# Patient Record
Sex: Female | Born: 1935
Health system: Southern US, Community
[De-identification: ages and names within clinical notes are randomized; demographics above are authoritative.]

## PROBLEM LIST (undated history)

## (undated) DIAGNOSIS — I10 Essential (primary) hypertension: Secondary | ICD-10-CM

## (undated) DIAGNOSIS — E079 Disorder of thyroid, unspecified: Secondary | ICD-10-CM

## (undated) DIAGNOSIS — F329 Major depressive disorder, single episode, unspecified: Secondary | ICD-10-CM

## (undated) DIAGNOSIS — D72819 Decreased white blood cell count, unspecified: Secondary | ICD-10-CM

## (undated) DIAGNOSIS — R600 Localized edema: Secondary | ICD-10-CM

## (undated) DIAGNOSIS — D693 Immune thrombocytopenic purpura: Secondary | ICD-10-CM

## (undated) DIAGNOSIS — F32A Depression, unspecified: Secondary | ICD-10-CM

## (undated) HISTORY — DX: Essential (primary) hypertension: I10

## (undated) HISTORY — PX: CHOLECYSTECTOMY: SHX55

## (undated) HISTORY — PX: APPENDECTOMY: SHX54

## (undated) HISTORY — PX: ABDOMINAL HYSTERECTOMY: SHX81

## (undated) HISTORY — DX: Major depressive disorder, single episode, unspecified: F32.9

## (undated) HISTORY — PX: TONSILLECTOMY: SUR1361

## (undated) HISTORY — PX: COLONOSCOPY: SHX174

## (undated) HISTORY — DX: Disorder of thyroid, unspecified: E07.9

## (undated) HISTORY — DX: Depression, unspecified: F32.A

---

## 1997-12-30 ENCOUNTER — Ambulatory Visit (HOSPITAL_COMMUNITY): Admission: RE | Admit: 1997-12-30 | Discharge: 1997-12-30 | Payer: Self-pay | Admitting: Emergency Medicine

## 1998-01-21 ENCOUNTER — Encounter: Payer: Self-pay | Admitting: Emergency Medicine

## 1998-01-21 ENCOUNTER — Ambulatory Visit (HOSPITAL_COMMUNITY): Admission: RE | Admit: 1998-01-21 | Discharge: 1998-01-21 | Payer: Self-pay | Admitting: Emergency Medicine

## 1998-09-08 ENCOUNTER — Ambulatory Visit (HOSPITAL_COMMUNITY): Admission: RE | Admit: 1998-09-08 | Discharge: 1998-09-08 | Payer: Self-pay | Admitting: Emergency Medicine

## 1998-09-08 ENCOUNTER — Encounter: Payer: Self-pay | Admitting: Emergency Medicine

## 1999-01-25 ENCOUNTER — Ambulatory Visit (HOSPITAL_COMMUNITY): Admission: RE | Admit: 1999-01-25 | Discharge: 1999-01-25 | Payer: Self-pay | Admitting: Emergency Medicine

## 1999-01-25 ENCOUNTER — Encounter: Payer: Self-pay | Admitting: Emergency Medicine

## 2000-05-18 ENCOUNTER — Ambulatory Visit (HOSPITAL_COMMUNITY): Admission: RE | Admit: 2000-05-18 | Discharge: 2000-05-18 | Payer: Self-pay | Admitting: Emergency Medicine

## 2000-05-18 ENCOUNTER — Encounter: Payer: Self-pay | Admitting: Emergency Medicine

## 2001-01-29 ENCOUNTER — Encounter (INDEPENDENT_AMBULATORY_CARE_PROVIDER_SITE_OTHER): Payer: Self-pay | Admitting: *Deleted

## 2001-01-29 ENCOUNTER — Ambulatory Visit (HOSPITAL_COMMUNITY): Admission: RE | Admit: 2001-01-29 | Discharge: 2001-01-29 | Payer: Self-pay | Admitting: Gastroenterology

## 2003-01-05 ENCOUNTER — Ambulatory Visit (HOSPITAL_COMMUNITY): Admission: RE | Admit: 2003-01-05 | Discharge: 2003-01-05 | Payer: Self-pay | Admitting: Emergency Medicine

## 2003-01-09 ENCOUNTER — Encounter: Admission: RE | Admit: 2003-01-09 | Discharge: 2003-01-09 | Payer: Self-pay | Admitting: Emergency Medicine

## 2005-01-11 ENCOUNTER — Emergency Department (HOSPITAL_COMMUNITY): Admission: EM | Admit: 2005-01-11 | Discharge: 2005-01-12 | Payer: Self-pay | Admitting: Emergency Medicine

## 2009-08-04 ENCOUNTER — Encounter: Admission: RE | Admit: 2009-08-04 | Discharge: 2009-08-04 | Payer: Self-pay | Admitting: Family Medicine

## 2010-02-19 ENCOUNTER — Encounter: Payer: Self-pay | Admitting: Emergency Medicine

## 2010-02-22 ENCOUNTER — Other Ambulatory Visit: Payer: Self-pay | Admitting: Family Medicine

## 2010-02-22 DIAGNOSIS — Z1239 Encounter for other screening for malignant neoplasm of breast: Secondary | ICD-10-CM

## 2010-03-08 ENCOUNTER — Ambulatory Visit
Admission: RE | Admit: 2010-03-08 | Discharge: 2010-03-08 | Disposition: A | Discharge status: Home / Self Care | Payer: BC Managed Care – HMO | Source: Ambulatory Visit | Attending: Family Medicine | Admitting: Family Medicine

## 2010-03-08 DIAGNOSIS — Z1239 Encounter for other screening for malignant neoplasm of breast: Secondary | ICD-10-CM

## 2010-06-17 NOTE — Procedures (Signed)
[place]. [facility]  Patient:    Claudia Moore, Claudia Moore Visit Number: [id number] MRN: [id number]          Service Type: END Location: ENDO Attending Physician:  Rich Brave Dictated by:   Florencia Reasons, M.D. Proc. Date: 01/29/01 Admit Date:  01/29/2001   CC:         Oley Balm. Georgina Pillion, M.D.   Procedure Report  PROCEDURE PERFORMED:  Colonoscopy with polypectomy.  ENDOSCOPIST:  Florencia Reasons, M.D.  INDICATIONS FOR PROCEDURE:  The patient is a 77 year old female for colon cancer screening in view of a family history of colon cancer in both parents and a prior history of some sort of polyps having been removed in [place] approximately five years ago.  FINDINGS:  Small rectal polyp removed..  DESCRIPTION OF PROCEDURE:  The nature, purpose and risks of the procedure had been discussed with the patient, who provided written consent.  Sedation prior to and during the course of the procedure was fentanyl 100 mcg and Versed 10 mg without arrhythmias or desaturation.  The Olympus adjustable tension pediatric video colonoscope was advanced with mild difficulty through an angulated fixated sigmoid region and then quite easily around the colon the remainder of the way to the cecum as identified by visualization of the appendiceal orifice and transillumination of the deep right lower quadrant.  At this time the tip of the scope was roughly at the level of the ileocecal valve but we were able to look down into the cecum very well, so it is felt that all areas were adequately seen.  Pullback was performed.  The quality of the prep was excellent and it is felt that all areas were well seen from the perspective of adequate cleansing in the colon. There was a 4 mm rectal polyp at about 7 cm from the external anal opening removed by snare technique and retrieved by suctioning through the scope. There were a couple of other very small sessile hyperplastic  appearing polyps in the distal rectum which essentially flattened out with insufflation and were not removed.  Retroflexion in the rectum showed only the small polyp which was removed but was otherwise unremarkable.  No large polyps, cancer, colitis, vascular malformations or diverticular disease were observed.  The patient tolerated the procedure well and there were no apparent complications.  IMPRESSION:  Small rectal polyp removed as described above.  PLAN:  Await pathology on polyp.  Anticipate colonoscopic follow-up in five years in view of the family history of colon cancer. Dictated by:   Florencia Reasons, M.D. Attending Physician:  Rich Brave DD:  01/29/01 TD:  01/29/01 Job: [id number]

## 2011-11-22 DIAGNOSIS — Z23 Encounter for immunization: Secondary | ICD-10-CM | POA: Diagnosis not present

## 2011-12-20 ENCOUNTER — Other Ambulatory Visit: Payer: Self-pay | Admitting: Family Medicine

## 2011-12-20 ENCOUNTER — Ambulatory Visit
Admission: RE | Admit: 2011-12-20 | Discharge: 2011-12-20 | Disposition: A | Discharge status: Home / Self Care | Payer: Medicare Other | Source: Ambulatory Visit | Attending: Family Medicine | Admitting: Family Medicine

## 2011-12-20 DIAGNOSIS — I10 Essential (primary) hypertension: Secondary | ICD-10-CM | POA: Diagnosis not present

## 2011-12-20 DIAGNOSIS — R0602 Shortness of breath: Secondary | ICD-10-CM

## 2011-12-20 DIAGNOSIS — E559 Vitamin D deficiency, unspecified: Secondary | ICD-10-CM | POA: Diagnosis not present

## 2011-12-20 DIAGNOSIS — R7301 Impaired fasting glucose: Secondary | ICD-10-CM | POA: Diagnosis not present

## 2011-12-20 DIAGNOSIS — R143 Flatulence: Secondary | ICD-10-CM | POA: Diagnosis not present

## 2011-12-20 DIAGNOSIS — R141 Gas pain: Secondary | ICD-10-CM | POA: Diagnosis not present

## 2011-12-20 DIAGNOSIS — R142 Eructation: Secondary | ICD-10-CM | POA: Diagnosis not present

## 2011-12-26 DIAGNOSIS — R0602 Shortness of breath: Secondary | ICD-10-CM | POA: Diagnosis not present

## 2012-01-17 DIAGNOSIS — E039 Hypothyroidism, unspecified: Secondary | ICD-10-CM | POA: Diagnosis not present

## 2012-01-17 DIAGNOSIS — R142 Eructation: Secondary | ICD-10-CM | POA: Diagnosis not present

## 2012-01-17 DIAGNOSIS — Z23 Encounter for immunization: Secondary | ICD-10-CM | POA: Diagnosis not present

## 2012-01-17 DIAGNOSIS — Z0000 Encounter for general adult medical examination without abnormal findings: Secondary | ICD-10-CM | POA: Diagnosis not present

## 2012-01-17 DIAGNOSIS — M2550 Pain in unspecified joint: Secondary | ICD-10-CM | POA: Diagnosis not present

## 2012-01-17 DIAGNOSIS — R141 Gas pain: Secondary | ICD-10-CM | POA: Diagnosis not present

## 2012-03-01 DIAGNOSIS — H04129 Dry eye syndrome of unspecified lacrimal gland: Secondary | ICD-10-CM | POA: Diagnosis not present

## 2012-06-20 DIAGNOSIS — E039 Hypothyroidism, unspecified: Secondary | ICD-10-CM | POA: Diagnosis not present

## 2012-10-15 DIAGNOSIS — M109 Gout, unspecified: Secondary | ICD-10-CM | POA: Diagnosis not present

## 2012-10-15 DIAGNOSIS — M79609 Pain in unspecified limb: Secondary | ICD-10-CM | POA: Diagnosis not present

## 2012-10-16 DIAGNOSIS — M109 Gout, unspecified: Secondary | ICD-10-CM | POA: Diagnosis not present

## 2012-10-29 DIAGNOSIS — T887XXA Unspecified adverse effect of drug or medicament, initial encounter: Secondary | ICD-10-CM | POA: Diagnosis not present

## 2012-10-29 DIAGNOSIS — I10 Essential (primary) hypertension: Secondary | ICD-10-CM | POA: Diagnosis not present

## 2012-10-29 DIAGNOSIS — M109 Gout, unspecified: Secondary | ICD-10-CM | POA: Diagnosis not present

## 2012-10-29 DIAGNOSIS — E215 Disorder of parathyroid gland, unspecified: Secondary | ICD-10-CM | POA: Diagnosis not present

## 2012-10-31 DIAGNOSIS — E039 Hypothyroidism, unspecified: Secondary | ICD-10-CM | POA: Diagnosis not present

## 2012-10-31 DIAGNOSIS — M109 Gout, unspecified: Secondary | ICD-10-CM | POA: Diagnosis not present

## 2012-12-06 DIAGNOSIS — Z23 Encounter for immunization: Secondary | ICD-10-CM | POA: Diagnosis not present

## 2013-01-17 DIAGNOSIS — F329 Major depressive disorder, single episode, unspecified: Secondary | ICD-10-CM | POA: Diagnosis not present

## 2013-01-17 DIAGNOSIS — M109 Gout, unspecified: Secondary | ICD-10-CM | POA: Diagnosis not present

## 2013-01-17 DIAGNOSIS — Z136 Encounter for screening for cardiovascular disorders: Secondary | ICD-10-CM | POA: Diagnosis not present

## 2013-01-17 DIAGNOSIS — R7309 Other abnormal glucose: Secondary | ICD-10-CM | POA: Diagnosis not present

## 2013-01-17 DIAGNOSIS — E039 Hypothyroidism, unspecified: Secondary | ICD-10-CM | POA: Diagnosis not present

## 2013-01-17 DIAGNOSIS — Z0000 Encounter for general adult medical examination without abnormal findings: Secondary | ICD-10-CM | POA: Diagnosis not present

## 2013-01-17 DIAGNOSIS — I714 Abdominal aortic aneurysm, without rupture: Secondary | ICD-10-CM | POA: Diagnosis not present

## 2013-01-17 DIAGNOSIS — I10 Essential (primary) hypertension: Secondary | ICD-10-CM | POA: Diagnosis not present

## 2013-01-20 ENCOUNTER — Other Ambulatory Visit: Payer: Self-pay | Admitting: Family Medicine

## 2013-01-20 DIAGNOSIS — L940 Localized scleroderma [morphea]: Secondary | ICD-10-CM | POA: Diagnosis not present

## 2013-01-20 DIAGNOSIS — L851 Acquired keratosis [keratoderma] palmaris et plantaris: Secondary | ICD-10-CM | POA: Diagnosis not present

## 2013-01-20 DIAGNOSIS — D235 Other benign neoplasm of skin of trunk: Secondary | ICD-10-CM | POA: Diagnosis not present

## 2013-01-20 DIAGNOSIS — Z9889 Other specified postprocedural states: Secondary | ICD-10-CM

## 2013-01-22 ENCOUNTER — Ambulatory Visit
Admission: RE | Admit: 2013-01-22 | Discharge: 2013-01-22 | Disposition: A | Discharge status: Home / Self Care | Payer: Medicare Other | Source: Ambulatory Visit | Attending: Family Medicine | Admitting: Family Medicine

## 2013-01-22 DIAGNOSIS — Z136 Encounter for screening for cardiovascular disorders: Secondary | ICD-10-CM | POA: Diagnosis not present

## 2013-01-22 DIAGNOSIS — Z9889 Other specified postprocedural states: Secondary | ICD-10-CM

## 2013-02-03 ENCOUNTER — Other Ambulatory Visit (HOSPITAL_COMMUNITY): Payer: Medicare Other

## 2013-02-05 ENCOUNTER — Ambulatory Visit (HOSPITAL_COMMUNITY): Payer: Medicare Other | Attending: Cardiovascular Disease | Admitting: Radiology

## 2013-02-05 ENCOUNTER — Other Ambulatory Visit (HOSPITAL_COMMUNITY): Payer: Self-pay | Admitting: Radiology

## 2013-02-05 ENCOUNTER — Encounter: Payer: Self-pay | Admitting: Cardiovascular Disease

## 2013-02-05 VITALS — BP 120/77 | Ht 62.0 in | Wt 193.0 lb

## 2013-02-05 DIAGNOSIS — I359 Nonrheumatic aortic valve disorder, unspecified: Secondary | ICD-10-CM | POA: Insufficient documentation

## 2013-02-05 DIAGNOSIS — E785 Hyperlipidemia, unspecified: Secondary | ICD-10-CM | POA: Insufficient documentation

## 2013-02-05 DIAGNOSIS — I10 Essential (primary) hypertension: Secondary | ICD-10-CM | POA: Diagnosis not present

## 2013-02-05 DIAGNOSIS — I77810 Thoracic aortic ectasia: Secondary | ICD-10-CM

## 2013-02-05 DIAGNOSIS — R609 Edema, unspecified: Secondary | ICD-10-CM

## 2013-02-05 NOTE — Progress Notes (Signed)
Echocardiogram performed.  

## 2013-02-15 DIAGNOSIS — I2510 Atherosclerotic heart disease of native coronary artery without angina pectoris: Secondary | ICD-10-CM | POA: Diagnosis not present

## 2013-02-15 DIAGNOSIS — E119 Type 2 diabetes mellitus without complications: Secondary | ICD-10-CM | POA: Diagnosis not present

## 2013-02-15 DIAGNOSIS — D649 Anemia, unspecified: Secondary | ICD-10-CM | POA: Diagnosis not present

## 2013-02-15 DIAGNOSIS — I509 Heart failure, unspecified: Secondary | ICD-10-CM | POA: Diagnosis not present

## 2013-02-16 DIAGNOSIS — E119 Type 2 diabetes mellitus without complications: Secondary | ICD-10-CM | POA: Diagnosis not present

## 2013-02-16 DIAGNOSIS — D649 Anemia, unspecified: Secondary | ICD-10-CM | POA: Diagnosis not present

## 2013-02-16 DIAGNOSIS — I2510 Atherosclerotic heart disease of native coronary artery without angina pectoris: Secondary | ICD-10-CM | POA: Diagnosis not present

## 2013-02-16 DIAGNOSIS — I509 Heart failure, unspecified: Secondary | ICD-10-CM | POA: Diagnosis not present

## 2013-09-02 ENCOUNTER — Other Ambulatory Visit: Payer: Self-pay | Admitting: Family Medicine

## 2013-09-02 DIAGNOSIS — M109 Gout, unspecified: Secondary | ICD-10-CM | POA: Diagnosis not present

## 2013-09-02 DIAGNOSIS — Z23 Encounter for immunization: Secondary | ICD-10-CM | POA: Diagnosis not present

## 2013-09-02 DIAGNOSIS — K7689 Other specified diseases of liver: Secondary | ICD-10-CM | POA: Diagnosis not present

## 2013-09-02 DIAGNOSIS — E039 Hypothyroidism, unspecified: Secondary | ICD-10-CM | POA: Diagnosis not present

## 2013-09-02 DIAGNOSIS — I10 Essential (primary) hypertension: Secondary | ICD-10-CM | POA: Diagnosis not present

## 2013-09-02 DIAGNOSIS — E785 Hyperlipidemia, unspecified: Secondary | ICD-10-CM | POA: Diagnosis not present

## 2013-09-02 DIAGNOSIS — F329 Major depressive disorder, single episode, unspecified: Secondary | ICD-10-CM | POA: Diagnosis not present

## 2013-09-09 ENCOUNTER — Ambulatory Visit
Admission: RE | Admit: 2013-09-09 | Discharge: 2013-09-09 | Disposition: A | Discharge status: Home / Self Care | Payer: Medicare Other | Source: Ambulatory Visit | Attending: Family Medicine | Admitting: Family Medicine

## 2013-09-09 ENCOUNTER — Encounter (INDEPENDENT_AMBULATORY_CARE_PROVIDER_SITE_OTHER): Payer: Self-pay

## 2013-09-09 DIAGNOSIS — K7689 Other specified diseases of liver: Secondary | ICD-10-CM | POA: Diagnosis not present

## 2013-11-10 DIAGNOSIS — R7301 Impaired fasting glucose: Secondary | ICD-10-CM | POA: Diagnosis not present

## 2013-11-10 DIAGNOSIS — E039 Hypothyroidism, unspecified: Secondary | ICD-10-CM | POA: Diagnosis not present

## 2014-01-20 DIAGNOSIS — E039 Hypothyroidism, unspecified: Secondary | ICD-10-CM | POA: Diagnosis not present

## 2014-01-20 DIAGNOSIS — H524 Presbyopia: Secondary | ICD-10-CM | POA: Diagnosis not present

## 2014-01-20 DIAGNOSIS — H5213 Myopia, bilateral: Secondary | ICD-10-CM | POA: Diagnosis not present

## 2014-01-20 DIAGNOSIS — H52223 Regular astigmatism, bilateral: Secondary | ICD-10-CM | POA: Diagnosis not present

## 2014-01-20 DIAGNOSIS — H2513 Age-related nuclear cataract, bilateral: Secondary | ICD-10-CM | POA: Diagnosis not present

## 2014-04-22 DIAGNOSIS — R7301 Impaired fasting glucose: Secondary | ICD-10-CM | POA: Diagnosis not present

## 2014-04-22 DIAGNOSIS — E039 Hypothyroidism, unspecified: Secondary | ICD-10-CM | POA: Diagnosis not present

## 2014-12-09 DIAGNOSIS — L281 Prurigo nodularis: Secondary | ICD-10-CM | POA: Diagnosis not present

## 2014-12-09 DIAGNOSIS — D239 Other benign neoplasm of skin, unspecified: Secondary | ICD-10-CM | POA: Diagnosis not present

## 2014-12-21 DIAGNOSIS — R7301 Impaired fasting glucose: Secondary | ICD-10-CM | POA: Diagnosis not present

## 2014-12-21 DIAGNOSIS — E784 Other hyperlipidemia: Secondary | ICD-10-CM | POA: Diagnosis not present

## 2014-12-21 DIAGNOSIS — M109 Gout, unspecified: Secondary | ICD-10-CM | POA: Diagnosis not present

## 2014-12-21 DIAGNOSIS — Z79899 Other long term (current) drug therapy: Secondary | ICD-10-CM | POA: Diagnosis not present

## 2014-12-21 DIAGNOSIS — E039 Hypothyroidism, unspecified: Secondary | ICD-10-CM | POA: Diagnosis not present

## 2014-12-21 DIAGNOSIS — F329 Major depressive disorder, single episode, unspecified: Secondary | ICD-10-CM | POA: Diagnosis not present

## 2014-12-21 DIAGNOSIS — Z23 Encounter for immunization: Secondary | ICD-10-CM | POA: Diagnosis not present

## 2015-01-19 DIAGNOSIS — D696 Thrombocytopenia, unspecified: Secondary | ICD-10-CM | POA: Diagnosis not present

## 2015-02-02 ENCOUNTER — Telehealth: Payer: Self-pay | Admitting: Hematology and Oncology

## 2015-02-02 NOTE — Telephone Encounter (Signed)
new patient appt-s/w patient and gave np appt for 01/13 @ 2 w/Dr. Alvy Bimler Referring Dr. Merilyn Baba Dx- Leukocytosis  Referral information scanned

## 2015-02-12 ENCOUNTER — Ambulatory Visit (HOSPITAL_BASED_OUTPATIENT_CLINIC_OR_DEPARTMENT_OTHER): Payer: Medicare Other | Admitting: Hematology and Oncology

## 2015-02-12 ENCOUNTER — Encounter: Payer: Self-pay | Admitting: Hematology and Oncology

## 2015-02-12 VITALS — BP 131/62 | HR 75 | Temp 98.5°F | Resp 18 | Ht 62.0 in | Wt 193.8 lb

## 2015-02-12 DIAGNOSIS — L821 Other seborrheic keratosis: Secondary | ICD-10-CM | POA: Diagnosis not present

## 2015-02-12 DIAGNOSIS — D696 Thrombocytopenia, unspecified: Secondary | ICD-10-CM | POA: Diagnosis not present

## 2015-02-12 DIAGNOSIS — D72829 Elevated white blood cell count, unspecified: Secondary | ICD-10-CM | POA: Diagnosis not present

## 2015-02-12 DIAGNOSIS — K760 Fatty (change of) liver, not elsewhere classified: Secondary | ICD-10-CM | POA: Diagnosis not present

## 2015-02-12 DIAGNOSIS — K909 Intestinal malabsorption, unspecified: Secondary | ICD-10-CM | POA: Diagnosis not present

## 2015-02-12 DIAGNOSIS — K9089 Other intestinal malabsorption: Secondary | ICD-10-CM | POA: Insufficient documentation

## 2015-02-12 NOTE — Assessment & Plan Note (Signed)
She had mild chronic thrombocytopenia, likely related to fatty liver disease. She have ultrasound done in 2015 which confirmed fatty liver disease with mild hepatomegaly.  she does not require further workup and this is unlikely will cause any symptoms.

## 2015-02-12 NOTE — Assessment & Plan Note (Signed)
She has mild chronic leukocytosis with mild neutrophilia. I suspect this is benign reactive leukocytosis. She had chronic sinus congestion/drainage and occasional skin rash. I reassured the patient that this is a benign reactive process and does not require further workup.

## 2015-02-12 NOTE — Assessment & Plan Note (Signed)
She has mild change in bowel habits with chronic intermittent diarrhea and change in stool caliber. Her history is highly suggestive of bile salt induced diarrhea as the patient does not have a gallbladder and it is related to certain dietary factor. I recommend dietary modification. In view of family history of colon cancer, I also recommend she called to see her gastroenterologist for further management.

## 2015-02-12 NOTE — Assessment & Plan Note (Signed)
She is moderately obese with palpable hepatomegaly and fatty liver change noted on ultrasound in 2015. This is benign in nature. I recommend dietary modification and minimum 20 pound weight loss. This is likely the contributing factors to thrombocytopenia.

## 2015-02-12 NOTE — Progress Notes (Signed)
[facility] CONSULT NOTE  Patient Care Team: [name], MD as PCP - General (Family Medicine)  CHIEF COMPLAINTS/PURPOSE OF CONSULTATION:   mild leukocytosis and thrombocytopenia  HISTORY OF PRESENTING ILLNESS:  Claudia Moore 82 y.o. female is here because of elevated WBC.  She was found to have abnormal CBC from routine blood test from her primary care doctor. Her total white blood cell count usually range slightly higher than upper limits of normal, between 11.5-12.9  With associated mild thromboccytopenia. Differential confirm mild neutrophilia She denies recent infection. The last prescription antibiotics was more than 3 months ago. However, she have chronic sinus congestion and drainage. Occasionally, she will have left periorbital pressure from sinus congestion.  She denies problems with poor dentition She has occasional sore throat. She denies recent cough. Denies dysuria, frequency or urgency. She has occasional itchy skin rash and was prescribed topical steroid cream that she used in the past. She also have history of bronchitis and had been placed on Proair on and off but has not used it for a while. She have mild anorexia and reduced energy and occasional diarrhea on and off. She is felt that the caliber of her stool had change over the last year or so. She denies weight loss. There is not reported symptoms of joint swelling/pain.  She had no prior history or diagnosis of cancer. Her age appropriate screening programs are up-to-date. The patient has no prior diagnosis of autoimmune disease. The patient is not a smoker.  MEDICAL HISTORY:  Past Medical History  Diagnosis Date  . Hypertension   . Thyroid disease   . Depression     SURGICAL HISTORY: Past Surgical History  Procedure Laterality Date  . Abdominal hysterectomy      endometriosis   . Appendectomy    . Cholecystectomy    . Tonsillectomy    . Colonoscopy      SOCIAL HISTORY: Social  History   Social History  . Marital Status: Married    Spouse Name: N/A  . Number of Children: N/A  . Years of Education: N/A   Occupational History  . Not on file.   Social History Main Topics  . Smoking status: Never Smoker   . Smokeless tobacco: Never Used  . Alcohol Use: No  . Drug Use: No  . Sexual Activity: Not on file     Comment: 1 biological child and 1 adopted. Married. retired Network engineer work   Other Topics Concern  . Not on file   Social History Narrative    FAMILY HISTORY: Family History  Problem Relation Age of Onset  . Cancer Mother     brain tumor  . Cancer Father     colon cancer    ALLERGIES:  is allergic to pine; celebrex; and zyrtec.  MEDICATIONS:  Current Outpatient Prescriptions  Medication Sig Dispense Refill  . allopurinol (ZYLOPRIM) 100 MG tablet Take 200 mg by mouth daily.  0  . atenolol (TENORMIN) 25 MG tablet Take 25 mg by mouth daily.  0  . Cholecalciferol (VITAMIN D PO) Take 5,000 Units by mouth.    . escitalopram (LEXAPRO) 10 MG tablet Take 10 mg by mouth daily.  3  . levothyroxine (SYNTHROID, LEVOTHROID) 100 MCG tablet Take 100 mcg by mouth daily.  3  . lisinopril-hydrochlorothiazide (PRINZIDE,ZESTORETIC) 20-25 MG tablet Take 1 tablet by mouth daily.  0   No current facility-administered medications for this visit.    REVIEW OF SYSTEMS:   Constitutional: Denies fevers,  chills or abnormal night sweats Eyes: Denies blurriness of vision, double vision or watery eyes Ears, nose, mouth, throat, and face: Denies mucositis or sore throat Respiratory: Denies cough, dyspnea or wheezes Cardiovascular: Denies palpitation, chest discomfort or lower extremity swelling Lymphatics: Denies new lymphadenopathy or easy bruising Neurological:Denies numbness, tingling or new weaknesses Behavioral/Psych: Mood is stable, no new changes  All other systems were reviewed with the patient and are negative.  PHYSICAL EXAMINATION: ECOG PERFORMANCE  STATUS: 1 - Symptomatic but completely ambulatory  Filed Vitals:   02/12/15 1343  BP: 131/62  Pulse: 75  Temp: 98.5 F (36.9 C)  Resp: 18   Filed Weights   02/12/15 1343  Weight: 193 lb 12.8 oz (87.907 kg)    GENERAL:alert, no distress and comfortable. She is obese SKIN:  No seborrheic keratosis and some scratch marks EYES: normal, conjunctiva are pink and non-injected, sclera clear OROPHARYNX:no exudate, no erythema and lips, buccal mucosa, and tongue normal  NECK: supple, thyroid normal size, non-tender, without nodularity LYMPH:  no palpable lymphadenopathy in the cervical, axillary or inguinal LUNGS: clear to auscultation and percussion with normal breathing effort HEART: regular rate & rhythm and no murmurs and no lower extremity edema ABDOMEN:abdomen soft, non-tender and normal bowel sounds. Palpable liver enlargement 1 inch beneath the right coastal margin Musculoskeletal:no cyanosis of digits and no clubbing  PSYCH: alert & oriented x 3 with fluent speech NEURO: no focal motor/sensory deficits  LABORATORY DATA:  I have reviewed the data as listed   RADIOGRAPHIC STUDIES: I reviewed report from prior ultrasound in 2015 I have personally reviewed the radiological images as listed and agreed with the findings in the report.   ASSESSMENT & PLAN Leukocytosis  She has mild chronic leukocytosis with mild neutrophilia. I suspect this is benign reactive leukocytosis. She had chronic sinus congestion/drainage and occasional skin rash. I reassured the patient that this is a benign reactive process and does not require further workup.  Thrombocytopenia ([place])  She had mild chronic thrombocytopenia, likely related to fatty liver disease. She have ultrasound done in 2015 which confirmed fatty liver disease with mild hepatomegaly.  she does not require further workup and this is unlikely will cause any symptoms.  Nonalcoholic fatty liver disease  She is moderately obese with  palpable hepatomegaly and fatty liver change noted on ultrasound in 2015. This is benign in nature. I recommend dietary modification and minimum 20 pound weight loss. This is likely the contributing factors to thrombocytopenia.  Bile salt-induced diarrhea  She has mild change in bowel habits with chronic intermittent diarrhea and change in stool caliber. Her history is highly suggestive of bile salt induced diarrhea as the patient does not have a gallbladder and it is related to certain dietary factor. I recommend dietary modification. In view of family history of colon cancer, I also recommend she called to see her gastroenterologist for further management.  Keratosis, seborrheic  She has inflamed seborrheic keratosis and I suspect is the cause of her skin rash. Recommend conservative management only. She follows with a dermatologist on a regular basis.   spent 45 minutes with the patient on counseling face-to-face encounter

## 2015-02-12 NOTE — Assessment & Plan Note (Signed)
She has inflamed seborrheic keratosis and I suspect is the cause of her skin rash. Recommend conservative management only. She follows with a dermatologist on a regular basis.

## 2015-05-26 DIAGNOSIS — M9902 Segmental and somatic dysfunction of thoracic region: Secondary | ICD-10-CM | POA: Diagnosis not present

## 2015-05-26 DIAGNOSIS — M9903 Segmental and somatic dysfunction of lumbar region: Secondary | ICD-10-CM | POA: Diagnosis not present

## 2015-05-26 DIAGNOSIS — M9901 Segmental and somatic dysfunction of cervical region: Secondary | ICD-10-CM | POA: Diagnosis not present

## 2015-05-26 DIAGNOSIS — M5030 Other cervical disc degeneration, unspecified cervical region: Secondary | ICD-10-CM | POA: Diagnosis not present

## 2015-05-28 DIAGNOSIS — M5030 Other cervical disc degeneration, unspecified cervical region: Secondary | ICD-10-CM | POA: Diagnosis not present

## 2015-05-28 DIAGNOSIS — M9903 Segmental and somatic dysfunction of lumbar region: Secondary | ICD-10-CM | POA: Diagnosis not present

## 2015-05-28 DIAGNOSIS — M9902 Segmental and somatic dysfunction of thoracic region: Secondary | ICD-10-CM | POA: Diagnosis not present

## 2015-05-28 DIAGNOSIS — M9901 Segmental and somatic dysfunction of cervical region: Secondary | ICD-10-CM | POA: Diagnosis not present

## 2015-05-31 DIAGNOSIS — M9901 Segmental and somatic dysfunction of cervical region: Secondary | ICD-10-CM | POA: Diagnosis not present

## 2015-05-31 DIAGNOSIS — M9902 Segmental and somatic dysfunction of thoracic region: Secondary | ICD-10-CM | POA: Diagnosis not present

## 2015-05-31 DIAGNOSIS — M9903 Segmental and somatic dysfunction of lumbar region: Secondary | ICD-10-CM | POA: Diagnosis not present

## 2015-05-31 DIAGNOSIS — M5030 Other cervical disc degeneration, unspecified cervical region: Secondary | ICD-10-CM | POA: Diagnosis not present

## 2015-06-09 DIAGNOSIS — M9903 Segmental and somatic dysfunction of lumbar region: Secondary | ICD-10-CM | POA: Diagnosis not present

## 2015-06-09 DIAGNOSIS — M9902 Segmental and somatic dysfunction of thoracic region: Secondary | ICD-10-CM | POA: Diagnosis not present

## 2015-06-09 DIAGNOSIS — M9901 Segmental and somatic dysfunction of cervical region: Secondary | ICD-10-CM | POA: Diagnosis not present

## 2015-06-09 DIAGNOSIS — M5030 Other cervical disc degeneration, unspecified cervical region: Secondary | ICD-10-CM | POA: Diagnosis not present

## 2015-06-11 DIAGNOSIS — M9902 Segmental and somatic dysfunction of thoracic region: Secondary | ICD-10-CM | POA: Diagnosis not present

## 2015-06-11 DIAGNOSIS — M5030 Other cervical disc degeneration, unspecified cervical region: Secondary | ICD-10-CM | POA: Diagnosis not present

## 2015-06-11 DIAGNOSIS — M9901 Segmental and somatic dysfunction of cervical region: Secondary | ICD-10-CM | POA: Diagnosis not present

## 2015-06-11 DIAGNOSIS — M9903 Segmental and somatic dysfunction of lumbar region: Secondary | ICD-10-CM | POA: Diagnosis not present

## 2015-06-14 DIAGNOSIS — M9903 Segmental and somatic dysfunction of lumbar region: Secondary | ICD-10-CM | POA: Diagnosis not present

## 2015-06-14 DIAGNOSIS — M9902 Segmental and somatic dysfunction of thoracic region: Secondary | ICD-10-CM | POA: Diagnosis not present

## 2015-06-14 DIAGNOSIS — M9901 Segmental and somatic dysfunction of cervical region: Secondary | ICD-10-CM | POA: Diagnosis not present

## 2015-06-14 DIAGNOSIS — M5030 Other cervical disc degeneration, unspecified cervical region: Secondary | ICD-10-CM | POA: Diagnosis not present

## 2015-06-16 DIAGNOSIS — M9902 Segmental and somatic dysfunction of thoracic region: Secondary | ICD-10-CM | POA: Diagnosis not present

## 2015-06-16 DIAGNOSIS — M5030 Other cervical disc degeneration, unspecified cervical region: Secondary | ICD-10-CM | POA: Diagnosis not present

## 2015-06-16 DIAGNOSIS — M9903 Segmental and somatic dysfunction of lumbar region: Secondary | ICD-10-CM | POA: Diagnosis not present

## 2015-06-16 DIAGNOSIS — M9901 Segmental and somatic dysfunction of cervical region: Secondary | ICD-10-CM | POA: Diagnosis not present

## 2015-06-18 DIAGNOSIS — M5030 Other cervical disc degeneration, unspecified cervical region: Secondary | ICD-10-CM | POA: Diagnosis not present

## 2015-06-18 DIAGNOSIS — M9903 Segmental and somatic dysfunction of lumbar region: Secondary | ICD-10-CM | POA: Diagnosis not present

## 2015-06-18 DIAGNOSIS — M9902 Segmental and somatic dysfunction of thoracic region: Secondary | ICD-10-CM | POA: Diagnosis not present

## 2015-06-18 DIAGNOSIS — M9901 Segmental and somatic dysfunction of cervical region: Secondary | ICD-10-CM | POA: Diagnosis not present

## 2015-06-21 DIAGNOSIS — M9902 Segmental and somatic dysfunction of thoracic region: Secondary | ICD-10-CM | POA: Diagnosis not present

## 2015-06-21 DIAGNOSIS — M9903 Segmental and somatic dysfunction of lumbar region: Secondary | ICD-10-CM | POA: Diagnosis not present

## 2015-06-21 DIAGNOSIS — M5030 Other cervical disc degeneration, unspecified cervical region: Secondary | ICD-10-CM | POA: Diagnosis not present

## 2015-06-21 DIAGNOSIS — M9901 Segmental and somatic dysfunction of cervical region: Secondary | ICD-10-CM | POA: Diagnosis not present

## 2015-06-23 DIAGNOSIS — M5030 Other cervical disc degeneration, unspecified cervical region: Secondary | ICD-10-CM | POA: Diagnosis not present

## 2015-06-23 DIAGNOSIS — M9902 Segmental and somatic dysfunction of thoracic region: Secondary | ICD-10-CM | POA: Diagnosis not present

## 2015-06-23 DIAGNOSIS — M9901 Segmental and somatic dysfunction of cervical region: Secondary | ICD-10-CM | POA: Diagnosis not present

## 2015-06-23 DIAGNOSIS — M9903 Segmental and somatic dysfunction of lumbar region: Secondary | ICD-10-CM | POA: Diagnosis not present

## 2015-10-13 DIAGNOSIS — M5481 Occipital neuralgia: Secondary | ICD-10-CM | POA: Diagnosis not present

## 2015-10-13 DIAGNOSIS — I10 Essential (primary) hypertension: Secondary | ICD-10-CM | POA: Diagnosis not present

## 2015-10-21 ENCOUNTER — Ambulatory Visit
Admission: RE | Admit: 2015-10-21 | Discharge: 2015-10-21 | Disposition: A | Discharge status: Home / Self Care | Payer: Medicare Other | Source: Ambulatory Visit | Attending: Family Medicine | Admitting: Family Medicine

## 2015-10-21 ENCOUNTER — Other Ambulatory Visit: Payer: Self-pay | Admitting: Family Medicine

## 2015-10-21 DIAGNOSIS — M47812 Spondylosis without myelopathy or radiculopathy, cervical region: Secondary | ICD-10-CM | POA: Diagnosis not present

## 2015-10-21 DIAGNOSIS — M5481 Occipital neuralgia: Secondary | ICD-10-CM

## 2015-12-09 DIAGNOSIS — Z23 Encounter for immunization: Secondary | ICD-10-CM | POA: Diagnosis not present

## 2015-12-28 DIAGNOSIS — I10 Essential (primary) hypertension: Secondary | ICD-10-CM | POA: Diagnosis not present

## 2015-12-28 DIAGNOSIS — M5030 Other cervical disc degeneration, unspecified cervical region: Secondary | ICD-10-CM | POA: Diagnosis not present

## 2016-08-23 DIAGNOSIS — M5030 Other cervical disc degeneration, unspecified cervical region: Secondary | ICD-10-CM | POA: Diagnosis not present

## 2016-08-23 DIAGNOSIS — F321 Major depressive disorder, single episode, moderate: Secondary | ICD-10-CM | POA: Diagnosis not present

## 2016-08-23 DIAGNOSIS — M109 Gout, unspecified: Secondary | ICD-10-CM | POA: Diagnosis not present

## 2016-08-23 DIAGNOSIS — Z0001 Encounter for general adult medical examination with abnormal findings: Secondary | ICD-10-CM | POA: Diagnosis not present

## 2016-08-23 DIAGNOSIS — I10 Essential (primary) hypertension: Secondary | ICD-10-CM | POA: Diagnosis not present

## 2016-08-23 DIAGNOSIS — D696 Thrombocytopenia, unspecified: Secondary | ICD-10-CM | POA: Diagnosis not present

## 2016-08-23 DIAGNOSIS — D72829 Elevated white blood cell count, unspecified: Secondary | ICD-10-CM | POA: Diagnosis not present

## 2016-08-23 DIAGNOSIS — Z79899 Other long term (current) drug therapy: Secondary | ICD-10-CM | POA: Diagnosis not present

## 2016-08-23 DIAGNOSIS — R7301 Impaired fasting glucose: Secondary | ICD-10-CM | POA: Diagnosis not present

## 2016-08-23 DIAGNOSIS — N951 Menopausal and female climacteric states: Secondary | ICD-10-CM | POA: Diagnosis not present

## 2016-08-23 DIAGNOSIS — Z1211 Encounter for screening for malignant neoplasm of colon: Secondary | ICD-10-CM | POA: Diagnosis not present

## 2016-08-23 DIAGNOSIS — E039 Hypothyroidism, unspecified: Secondary | ICD-10-CM | POA: Diagnosis not present

## 2016-10-10 DIAGNOSIS — R195 Other fecal abnormalities: Secondary | ICD-10-CM | POA: Diagnosis not present

## 2016-10-10 DIAGNOSIS — Z86010 Personal history of colonic polyps: Secondary | ICD-10-CM | POA: Diagnosis not present

## 2016-10-20 DIAGNOSIS — D120 Benign neoplasm of cecum: Secondary | ICD-10-CM | POA: Diagnosis not present

## 2016-10-20 DIAGNOSIS — D126 Benign neoplasm of colon, unspecified: Secondary | ICD-10-CM | POA: Diagnosis not present

## 2016-10-20 DIAGNOSIS — K635 Polyp of colon: Secondary | ICD-10-CM | POA: Diagnosis not present

## 2016-10-20 DIAGNOSIS — R195 Other fecal abnormalities: Secondary | ICD-10-CM | POA: Diagnosis not present

## 2016-10-26 DIAGNOSIS — D126 Benign neoplasm of colon, unspecified: Secondary | ICD-10-CM | POA: Diagnosis not present

## 2016-10-26 DIAGNOSIS — K635 Polyp of colon: Secondary | ICD-10-CM | POA: Diagnosis not present

## 2016-10-26 DIAGNOSIS — D72829 Elevated white blood cell count, unspecified: Secondary | ICD-10-CM | POA: Diagnosis not present

## 2016-11-20 DIAGNOSIS — Z23 Encounter for immunization: Secondary | ICD-10-CM | POA: Diagnosis not present

## 2016-11-30 DIAGNOSIS — M542 Cervicalgia: Secondary | ICD-10-CM | POA: Diagnosis not present

## 2016-12-13 DIAGNOSIS — M542 Cervicalgia: Secondary | ICD-10-CM | POA: Diagnosis not present

## 2016-12-15 DIAGNOSIS — M542 Cervicalgia: Secondary | ICD-10-CM | POA: Diagnosis not present

## 2017-02-05 DIAGNOSIS — H524 Presbyopia: Secondary | ICD-10-CM | POA: Diagnosis not present

## 2017-02-05 DIAGNOSIS — H52223 Regular astigmatism, bilateral: Secondary | ICD-10-CM | POA: Diagnosis not present

## 2017-02-05 DIAGNOSIS — H2513 Age-related nuclear cataract, bilateral: Secondary | ICD-10-CM | POA: Diagnosis not present

## 2017-02-05 DIAGNOSIS — H5213 Myopia, bilateral: Secondary | ICD-10-CM | POA: Diagnosis not present

## 2017-02-13 DIAGNOSIS — H25812 Combined forms of age-related cataract, left eye: Secondary | ICD-10-CM | POA: Diagnosis not present

## 2017-02-13 DIAGNOSIS — H02831 Dermatochalasis of right upper eyelid: Secondary | ICD-10-CM | POA: Diagnosis not present

## 2017-02-13 DIAGNOSIS — H25811 Combined forms of age-related cataract, right eye: Secondary | ICD-10-CM | POA: Diagnosis not present

## 2017-02-13 DIAGNOSIS — H04123 Dry eye syndrome of bilateral lacrimal glands: Secondary | ICD-10-CM | POA: Diagnosis not present

## 2017-02-21 DIAGNOSIS — M542 Cervicalgia: Secondary | ICD-10-CM | POA: Diagnosis not present

## 2017-02-22 DIAGNOSIS — Z961 Presence of intraocular lens: Secondary | ICD-10-CM | POA: Diagnosis not present

## 2017-02-22 DIAGNOSIS — H25812 Combined forms of age-related cataract, left eye: Secondary | ICD-10-CM | POA: Diagnosis not present

## 2017-02-22 DIAGNOSIS — Z9842 Cataract extraction status, left eye: Secondary | ICD-10-CM | POA: Diagnosis not present

## 2017-02-22 DIAGNOSIS — H2512 Age-related nuclear cataract, left eye: Secondary | ICD-10-CM | POA: Diagnosis not present

## 2017-03-08 DIAGNOSIS — H25811 Combined forms of age-related cataract, right eye: Secondary | ICD-10-CM | POA: Diagnosis not present

## 2017-03-08 DIAGNOSIS — H2511 Age-related nuclear cataract, right eye: Secondary | ICD-10-CM | POA: Diagnosis not present

## 2017-03-08 DIAGNOSIS — H52223 Regular astigmatism, bilateral: Secondary | ICD-10-CM | POA: Diagnosis not present

## 2017-03-08 DIAGNOSIS — Z961 Presence of intraocular lens: Secondary | ICD-10-CM | POA: Diagnosis not present

## 2017-03-08 DIAGNOSIS — Z9849 Cataract extraction status, unspecified eye: Secondary | ICD-10-CM | POA: Diagnosis not present

## 2017-06-14 DIAGNOSIS — M542 Cervicalgia: Secondary | ICD-10-CM | POA: Diagnosis not present

## 2017-08-28 DIAGNOSIS — D72829 Elevated white blood cell count, unspecified: Secondary | ICD-10-CM | POA: Diagnosis not present

## 2017-08-28 DIAGNOSIS — E2839 Other primary ovarian failure: Secondary | ICD-10-CM | POA: Diagnosis not present

## 2017-08-28 DIAGNOSIS — I10 Essential (primary) hypertension: Secondary | ICD-10-CM | POA: Diagnosis not present

## 2017-08-28 DIAGNOSIS — E039 Hypothyroidism, unspecified: Secondary | ICD-10-CM | POA: Diagnosis not present

## 2017-08-28 DIAGNOSIS — M109 Gout, unspecified: Secondary | ICD-10-CM | POA: Diagnosis not present

## 2017-08-28 DIAGNOSIS — F321 Major depressive disorder, single episode, moderate: Secondary | ICD-10-CM | POA: Diagnosis not present

## 2017-08-28 DIAGNOSIS — M5030 Other cervical disc degeneration, unspecified cervical region: Secondary | ICD-10-CM | POA: Diagnosis not present

## 2017-08-28 DIAGNOSIS — Z0001 Encounter for general adult medical examination with abnormal findings: Secondary | ICD-10-CM | POA: Diagnosis not present

## 2017-08-28 DIAGNOSIS — Z136 Encounter for screening for cardiovascular disorders: Secondary | ICD-10-CM | POA: Diagnosis not present

## 2017-08-28 DIAGNOSIS — Z79899 Other long term (current) drug therapy: Secondary | ICD-10-CM | POA: Diagnosis not present

## 2017-08-28 DIAGNOSIS — R7301 Impaired fasting glucose: Secondary | ICD-10-CM | POA: Diagnosis not present

## 2017-08-29 DIAGNOSIS — D696 Thrombocytopenia, unspecified: Secondary | ICD-10-CM | POA: Diagnosis not present

## 2017-09-03 ENCOUNTER — Other Ambulatory Visit: Payer: Self-pay | Admitting: Hematology and Oncology

## 2017-09-03 DIAGNOSIS — D696 Thrombocytopenia, unspecified: Secondary | ICD-10-CM

## 2017-09-03 DIAGNOSIS — D72829 Elevated white blood cell count, unspecified: Secondary | ICD-10-CM

## 2017-09-05 ENCOUNTER — Telehealth: Payer: Self-pay | Admitting: Hematology and Oncology

## 2017-09-05 NOTE — Telephone Encounter (Signed)
Per 8/5 sch msg.  Called patient to sch lab/dr.  Made appt for 8/9.

## 2017-09-07 ENCOUNTER — Encounter: Payer: Self-pay | Admitting: Hematology and Oncology

## 2017-09-07 ENCOUNTER — Telehealth: Payer: Self-pay | Admitting: Hematology and Oncology

## 2017-09-07 ENCOUNTER — Inpatient Hospital Stay (HOSPITAL_BASED_OUTPATIENT_CLINIC_OR_DEPARTMENT_OTHER): Payer: Medicare Other | Admitting: Hematology and Oncology

## 2017-09-07 ENCOUNTER — Inpatient Hospital Stay: Payer: Medicare Other | Attending: Hematology and Oncology

## 2017-09-07 DIAGNOSIS — R600 Localized edema: Secondary | ICD-10-CM

## 2017-09-07 DIAGNOSIS — M109 Gout, unspecified: Secondary | ICD-10-CM | POA: Insufficient documentation

## 2017-09-07 DIAGNOSIS — D696 Thrombocytopenia, unspecified: Secondary | ICD-10-CM | POA: Diagnosis not present

## 2017-09-07 DIAGNOSIS — Z79899 Other long term (current) drug therapy: Secondary | ICD-10-CM | POA: Insufficient documentation

## 2017-09-07 DIAGNOSIS — D72829 Elevated white blood cell count, unspecified: Secondary | ICD-10-CM

## 2017-09-07 DIAGNOSIS — D693 Immune thrombocytopenic purpura: Secondary | ICD-10-CM | POA: Diagnosis not present

## 2017-09-07 DIAGNOSIS — M1A00X0 Idiopathic chronic gout, unspecified site, without tophus (tophi): Secondary | ICD-10-CM

## 2017-09-07 DIAGNOSIS — L509 Urticaria, unspecified: Secondary | ICD-10-CM | POA: Insufficient documentation

## 2017-09-07 DIAGNOSIS — E790 Hyperuricemia without signs of inflammatory arthritis and tophaceous disease: Secondary | ICD-10-CM | POA: Diagnosis not present

## 2017-09-07 DIAGNOSIS — C8590 Non-Hodgkin lymphoma, unspecified, unspecified site: Secondary | ICD-10-CM

## 2017-09-07 LAB — CBC WITH DIFFERENTIAL/PLATELET
BASOS PCT: 0 %
Basophils Absolute: 0 10*3/uL (ref 0.0–0.1)
EOS ABS: 0 10*3/uL (ref 0.0–0.5)
EOS PCT: 1 %
HCT: 40.1 % (ref 34.8–46.6)
Hemoglobin: 13.5 g/dL (ref 11.6–15.9)
LYMPHS ABS: 1.2 10*3/uL (ref 0.9–3.3)
Lymphocytes Relative: 23 %
MCH: 32.7 pg (ref 25.1–34.0)
MCHC: 33.7 g/dL (ref 31.5–36.0)
MCV: 97.1 fL (ref 79.5–101.0)
Monocytes Absolute: 0.1 10*3/uL (ref 0.1–0.9)
Monocytes Relative: 2 %
Neutro Abs: 3.9 10*3/uL (ref 1.5–6.5)
Neutrophils Relative %: 74 %
PLATELETS: 40 10*3/uL — AB (ref 145–400)
RBC: 4.13 MIL/uL (ref 3.70–5.45)
RDW: 15.8 % — ABNORMAL HIGH (ref 11.2–14.5)
WBC: 5.2 10*3/uL (ref 3.9–10.3)

## 2017-09-07 MED ORDER — PREDNISONE 20 MG PO TABS: 20.0000 mg | ORAL_TABLET | Freq: Every day | ORAL | 0 refills | Status: DC

## 2017-09-07 NOTE — Progress Notes (Signed)
[place] OFFICE PROGRESS NOTE  [name], [name], MD  ASSESSMENT & PLAN:  Non-Hodgkin lymphoma ([place]) She has palpable fullness on both supraclavicular region She has also mild liver enlargement on exam I recommend CT scan of the chest, abdomen and pelvis to exclude lymphoma as a cause of her thrombocytopenia I will plan to see her back after the next week to review test results  Thrombocytopenia ([place]) She has progressive, worsening thrombocytopenia over the last 2 years Malignancy versus ITP would be at the top of differential diagnoses She is noted to have mild petechiae I recommend low-dose prednisone therapy and plan to recheck her blood count again when I see her back in 10 days At this point, she does not need transfusion support She is recommended to avoid NSAID or aspirin therapy  Hives She has nonspecific rash and hives Etiology is unknown I recommend low-dose prednisone therapy to treat her hives and that might stabilize her platelet count as well The risk, benefits, side effects of prednisone were discussed including fluid retention, gastritis, hyperglycemia and hypertension were discussed  Gout She was treated with recurrent courses of allopurinol and blood work review evidence of recurrent elevation of uric acid level I plan to recheck uric acid level when I see her next  Bilateral leg edema She is noted to have bilateral lower extremity edema Her last echocardiogram done in 2017 showed preserved ejection fraction Recommend CT imaging to exclude lymphadenopathy as a cause of leg swelling If that exclude evidence of intra-peritoneal lymphadenopathy as a cause of bilateral leg edema, I plan to order echocardiogram to exclude congestive heart failure   Orders Placed This Encounter  Procedures  . CT ABDOMEN PELVIS W CONTRAST    Standing Status:   Future    Standing Expiration Date:   09/08/2018    Order Specific Question:   If indicated for the ordered  procedure, I authorize the administration of contrast media per Radiology protocol    Answer:   Yes    Order Specific Question:   Preferred imaging location?    Answer:   [facility]    Order Specific Question:   Radiology Contrast Protocol - do NOT remove file path    Answer:   \\[email or web address]  . CT CHEST W CONTRAST    Standing Status:   Future    Standing Expiration Date:   09/08/2018    Order Specific Question:   If indicated for the ordered procedure, I authorize the administration of contrast media per Radiology protocol    Answer:   Yes    Order Specific Question:   Preferred imaging location?    Answer:   [facility]    Order Specific Question:   Radiology Contrast Protocol - do NOT remove file path    Answer:   \\[email or web address]  . CBC with Differential/Platelet    Standing Status:   Future    Standing Expiration Date:   10/12/2018  . Comprehensive metabolic panel    Standing Status:   Future    Standing Expiration Date:   10/12/2018  . Lactate dehydrogenase    Standing Status:   Future    Standing Expiration Date:   09/08/2018  . Uric acid    Standing Status:   Future    Standing Expiration Date:   09/08/2018  . Kappa/lambda light chains    Standing Status:   Future    Standing Expiration Date:   10/12/2018  . Multiple Myeloma Panel (SPEP&IFE w/QIG)  Standing Status:   Future    Standing Expiration Date:   10/12/2018    INTERVAL HISTORY: Claudia Moore 84 y.o. female returns for evaluation and follow-up due to progressive thrombocytopenia She is here accompanied by her husband I saw her 2 years ago for leukocytosis and mild thrombocytopenia She brought with her copies of blood work dated back from January 19, 2015. Her white count initially was high at 12.9, subsequently normalized over the last year In terms of her platelet count, it started at 132,000, then got progressively worse to 40,000 on  08/28/2017 She has noted evidence of petechiae rash on her lower extremities. The patient denies any recent signs or symptoms of bleeding such as spontaneous epistaxis, hematuria or hematochezia. She also broke out in hives affecting her upper extremity of her arm and several other places.  It was extremely itchy. She denies recent new clothing, detergent, pets or new medications. She complained of bilateral lower extremity edema that has been chronic over the past year and recurrent history of gout She denies new lymphadenopathy No recent infection, fever or chills  SUMMARY OF HEMATOLOGIC HISTORY:  She was last seen in 2017, after referral for abnormal CBC Her total white blood cell count usually range slightly higher than upper limits of normal, between 11.5-12.9  With associated mild thrombocytopenia. Differential confirm mild neutrophilia She denies recent infection. The last prescription antibiotics was more than 3 months ago. However, she have chronic sinus congestion and drainage. Occasionally, she will have left periorbital pressure from sinus congestion.  She denies problems with poor dentition She was recommend observation only  I have reviewed the past medical history, past surgical history, social history and family history with the patient and they are unchanged from previous note.  ALLERGIES:  is allergic to pine; celebrex [celecoxib]; and zyrtec [cetirizine].  MEDICATIONS:  Current Outpatient Medications  Medication Sig Dispense Refill  . atenolol (TENORMIN) 25 MG tablet Take 25 mg by mouth daily.  0  . Cholecalciferol (VITAMIN D PO) Take 5,000 Units by mouth.    . escitalopram (LEXAPRO) 10 MG tablet Take 10 mg by mouth daily.  3  . levothyroxine (SYNTHROID, LEVOTHROID) 100 MCG tablet Take 100 mcg by mouth daily.  3  . lisinopril-hydrochlorothiazide (PRINZIDE,ZESTORETIC) 20-25 MG tablet Take 1 tablet by mouth daily.  0  . predniSONE (DELTASONE) 20 MG tablet Take 1 tablet (20  mg total) by mouth daily with breakfast. 30 tablet 0   No current facility-administered medications for this visit.      REVIEW OF SYSTEMS:   Constitutional: Denies fevers, chills or night sweats Eyes: Denies blurriness of vision Ears, nose, mouth, throat, and face: Denies mucositis or sore throat Respiratory: Denies cough, dyspnea or wheezes Gastrointestinal:  Denies nausea, heartburn or change in bowel habits Lymphatics: Denies new lymphadenopathy or easy bruising Neurological:Denies numbness, tingling or new weaknesses Behavioral/Psych: Mood is stable, no new changes  All other systems were reviewed with the patient and are negative.  PHYSICAL EXAMINATION: ECOG PERFORMANCE STATUS: 2 - Symptomatic, <50% confined to bed  Vitals:   09/07/17 1452  BP: 123/72  Pulse: 71  Resp: 18  Temp: 98.4 F (36.9 C)  SpO2: 95%   Filed Weights   09/07/17 1452  Weight: 182 lb 6.4 oz (82.7 kg)    GENERAL:alert, no distress and comfortable SKIN: Skin hives are noted on the right upper extremity.  Petechiae rash are noted on bilateral lower extremity EYES: normal, Conjunctiva are pink and non-injected, sclera clear  OROPHARYNX:no exudate, no erythema and lips, buccal mucosa, and tongue normal  NECK: supple, thyroid normal size, non-tender, without nodularity LYMPH: She has palpable lymphadenopathy on both supraclavicular region LUNGS: clear to auscultation and percussion with normal breathing effort HEART: regular rate & rhythm and no murmurs with moderate bilateral lower extremity edema ABDOMEN:abdomen soft, mild liver enlargement, non-tender and normal bowel sounds Musculoskeletal:no cyanosis of digits and no clubbing  NEURO: alert & oriented x 3 with fluent speech, no focal motor/sensory deficits  LABORATORY DATA:  I have reviewed the data as listed  No results found for: NA, K, CL, CO2, GLUCOSE, BUN, CREATININE, CALCIUM, PROT, ALBUMIN, AST, ALT, ALKPHOS, BILITOT, GFRNONAA, GFRAA  No  results found for: SPEP, UPEP  Lab Results  Component Value Date   WBC 5.2 09/07/2017   NEUTROABS 3.9 09/07/2017   HGB 13.5 09/07/2017   HCT 40.1 09/07/2017   MCV 97.1 09/07/2017   PLT 40 (L) 09/07/2017      Chemistry   No results found for: NA, K, CL, CO2, BUN, CREATININE, GLU No results found for: CALCIUM, ALKPHOS, AST, ALT, BILITOT    I spent 30 minutes counseling the patient face to face. The total time spent in the appointment was 40 minutes and more than 50% was on counseling.   All questions were answered. The patient knows to call the clinic with any problems, questions or concerns. No barriers to learning was detected.    Heath Lark, MD 8/9/20194:57 PM

## 2017-09-07 NOTE — Assessment & Plan Note (Signed)
She has nonspecific rash and hives Etiology is unknown I recommend low-dose prednisone therapy to treat her hives and that might stabilize her platelet count as well The risk, benefits, side effects of prednisone were discussed including fluid retention, gastritis, hyperglycemia and hypertension were discussed

## 2017-09-07 NOTE — Assessment & Plan Note (Signed)
She was treated with recurrent courses of allopurinol and blood work review evidence of recurrent elevation of uric acid level I plan to recheck uric acid level when I see her next

## 2017-09-07 NOTE — Assessment & Plan Note (Signed)
She has progressive, worsening thrombocytopenia over the last 2 years Malignancy versus ITP would be at the top of differential diagnoses She is noted to have mild petechiae I recommend low-dose prednisone therapy and plan to recheck her blood count again when I see her back in 10 days At this point, she does not need transfusion support She is recommended to avoid NSAID or aspirin therapy

## 2017-09-07 NOTE — Telephone Encounter (Signed)
Gave patient avs and calendar of upcoming appts.  °

## 2017-09-07 NOTE — Assessment & Plan Note (Signed)
She has palpable fullness on both supraclavicular region She has also mild liver enlargement on exam I recommend CT scan of the chest, abdomen and pelvis to exclude lymphoma as a cause of her thrombocytopenia I will plan to see her back after the next week to review test results

## 2017-09-07 NOTE — Assessment & Plan Note (Signed)
She is noted to have bilateral lower extremity edema Her last echocardiogram done in 2017 showed preserved ejection fraction Recommend CT imaging to exclude lymphadenopathy as a cause of leg swelling If that exclude evidence of intra-peritoneal lymphadenopathy as a cause of bilateral leg edema, I plan to order echocardiogram to exclude congestive heart failure

## 2017-09-11 ENCOUNTER — Telehealth: Payer: Self-pay | Admitting: Hematology and Oncology

## 2017-09-11 ENCOUNTER — Telehealth: Payer: Self-pay | Admitting: *Deleted

## 2017-09-11 NOTE — Telephone Encounter (Signed)
"  B/P fluctuating 113/56, 133/68.  Involuntary tremors started again.  Come and go but I do not want to go another night with tremors.  Have not called Dr. Dorthy Cooler because I know what he'll do." Symptoms provided to [facility].   Verbal order received and read back from [name] for Claudia Moore to contact Dr. Dorthy Cooler for evaluation.  Order given to Claudia Moore at this time.     "What if he tells me to call your office and what am I to do if I can't have the scan Friday? Report to ED for evaluation.  Tremors are not related to the prednisone.   No further questions at this time,

## 2017-09-11 NOTE — Telephone Encounter (Signed)
Returned call to [employer].  Advised to report to ED upon receipt of voicemail reporting "B/P = 164/146, P = 132".   " I just wanted to know if I should take the prednisone.    No pain, fever, shortness of breath.  I experienced intermittent involuntary movements to my torso last evening.  Finally slept 4:30 am til 8:00 am.    I did too much, overused my neck yesterday, I have neck problems anyway.    Not drinking enough water, dehydrated which affected my muscles so I'm trying to drink more tap, well water.    I'm settling down now.  B/P at 8:30 am = 121/60.  At 9:15 am B/P = 130/59.  Have not missed any B/P pills.    Woke up with no more tremors, B/P in normal range for me.  I'll call back if I need to see her."  Aware provider out of office.  Declined [facility] visit at this time.  Advised anytime top/systolic greater than 413 or bottom/diastolic greater than 244 do not wait to call provider managing B/P and/or report to ED.  Consequences of high B/P seen as an emergent crisis.  Do not wait because B/P is not seen until checked and can become uncontrolled.

## 2017-09-11 NOTE — Telephone Encounter (Signed)
Called regarding 8/16

## 2017-09-14 ENCOUNTER — Inpatient Hospital Stay: Payer: Medicare Other

## 2017-09-14 ENCOUNTER — Ambulatory Visit (HOSPITAL_COMMUNITY)
Admission: RE | Admit: 2017-09-14 | Discharge: 2017-09-14 | Disposition: A | Discharge status: Home / Self Care | Payer: Medicare Other | Source: Ambulatory Visit | Attending: Hematology and Oncology | Admitting: Hematology and Oncology

## 2017-09-14 DIAGNOSIS — C8590 Non-Hodgkin lymphoma, unspecified, unspecified site: Secondary | ICD-10-CM

## 2017-09-14 DIAGNOSIS — D696 Thrombocytopenia, unspecified: Secondary | ICD-10-CM | POA: Diagnosis not present

## 2017-09-14 DIAGNOSIS — L509 Urticaria, unspecified: Secondary | ICD-10-CM | POA: Diagnosis not present

## 2017-09-14 DIAGNOSIS — Z79899 Other long term (current) drug therapy: Secondary | ICD-10-CM | POA: Diagnosis not present

## 2017-09-14 DIAGNOSIS — C8580 Other specified types of non-Hodgkin lymphoma, unspecified site: Secondary | ICD-10-CM | POA: Diagnosis not present

## 2017-09-14 DIAGNOSIS — R918 Other nonspecific abnormal finding of lung field: Secondary | ICD-10-CM | POA: Insufficient documentation

## 2017-09-14 DIAGNOSIS — R600 Localized edema: Secondary | ICD-10-CM | POA: Diagnosis not present

## 2017-09-14 DIAGNOSIS — M109 Gout, unspecified: Secondary | ICD-10-CM | POA: Diagnosis not present

## 2017-09-14 LAB — COMPREHENSIVE METABOLIC PANEL
ALT: 44 U/L (ref 0–44)
AST: 36 U/L (ref 15–41)
Albumin: 4 g/dL (ref 3.5–5.0)
Alkaline Phosphatase: 43 U/L (ref 38–126)
Anion gap: 12 (ref 5–15)
BUN: 25 mg/dL — ABNORMAL HIGH (ref 8–23)
CO2: 28 mmol/L (ref 22–32)
Calcium: 10.1 mg/dL (ref 8.9–10.3)
Chloride: 99 mmol/L (ref 98–111)
Creatinine, Ser: 1.15 mg/dL — ABNORMAL HIGH (ref 0.44–1.00)
GFR calc Af Amer: 50 mL/min — ABNORMAL LOW (ref >60)
GFR calc non Af Amer: 43 mL/min — ABNORMAL LOW (ref >60)
GLUCOSE: 95 mg/dL (ref 70–99)
Potassium: 4.1 mmol/L (ref 3.5–5.1)
Sodium: 139 mmol/L (ref 135–145)
Total Bilirubin: 0.9 mg/dL (ref 0.3–1.2)
Total Protein: 7 g/dL (ref 6.5–8.1)

## 2017-09-14 LAB — CBC WITH DIFFERENTIAL/PLATELET
Basophils Absolute: 0 10*3/uL (ref 0.0–0.1)
Basophils Relative: 0 %
Eosinophils Absolute: 0 10*3/uL (ref 0.0–0.5)
Eosinophils Relative: 1 %
HEMATOCRIT: 42.7 % (ref 34.8–46.6)
Hemoglobin: 14.6 g/dL (ref 11.6–15.9)
LYMPHS PCT: 28 %
Lymphs Abs: 1.8 10*3/uL (ref 0.9–3.3)
MCH: 32.7 pg (ref 25.1–34.0)
MCHC: 34.2 g/dL (ref 31.5–36.0)
MCV: 95.7 fL (ref 79.5–101.0)
MONO ABS: 0.1 10*3/uL (ref 0.1–0.9)
Monocytes Relative: 1 %
NEUTROS ABS: 4.7 10*3/uL (ref 1.5–6.5)
Neutrophils Relative %: 70 %
Platelets: 67 10*3/uL — ABNORMAL LOW (ref 145–400)
RBC: 4.46 MIL/uL (ref 3.70–5.45)
RDW: 15.1 % — ABNORMAL HIGH (ref 11.2–14.5)
WBC: 6.6 10*3/uL (ref 3.9–10.3)

## 2017-09-14 MED ORDER — IOHEXOL 300 MG/ML  SOLN
100.0000 mL | Freq: Once | INTRAMUSCULAR | Status: AC | PRN
  Administered 2017-09-14: 80 mL via INTRAVENOUS

## 2017-09-17 ENCOUNTER — Telehealth: Payer: Self-pay | Admitting: Hematology and Oncology

## 2017-09-17 ENCOUNTER — Other Ambulatory Visit: Payer: Medicare Other

## 2017-09-17 ENCOUNTER — Inpatient Hospital Stay (HOSPITAL_BASED_OUTPATIENT_CLINIC_OR_DEPARTMENT_OTHER): Payer: Medicare Other | Admitting: Hematology and Oncology

## 2017-09-17 VITALS — BP 128/71 | HR 58 | Temp 97.5°F | Resp 17 | Ht 62.0 in | Wt 179.7 lb

## 2017-09-17 DIAGNOSIS — M109 Gout, unspecified: Secondary | ICD-10-CM | POA: Diagnosis not present

## 2017-09-17 DIAGNOSIS — D696 Thrombocytopenia, unspecified: Secondary | ICD-10-CM

## 2017-09-17 DIAGNOSIS — R600 Localized edema: Secondary | ICD-10-CM | POA: Diagnosis not present

## 2017-09-17 DIAGNOSIS — L509 Urticaria, unspecified: Secondary | ICD-10-CM | POA: Diagnosis not present

## 2017-09-17 DIAGNOSIS — K7689 Other specified diseases of liver: Secondary | ICD-10-CM

## 2017-09-17 DIAGNOSIS — Z79899 Other long term (current) drug therapy: Secondary | ICD-10-CM | POA: Diagnosis not present

## 2017-09-17 DIAGNOSIS — C8590 Non-Hodgkin lymphoma, unspecified, unspecified site: Secondary | ICD-10-CM | POA: Diagnosis not present

## 2017-09-17 NOTE — Telephone Encounter (Signed)
Gave avs and calendar ° °

## 2017-09-19 ENCOUNTER — Encounter: Payer: Self-pay | Admitting: Hematology and Oncology

## 2017-09-19 NOTE — Progress Notes (Signed)
[place] OFFICE PROGRESS NOTE  [name], [name], MD  ASSESSMENT & PLAN:  Thrombocytopenia ([place]) CT scan showed no evidence of lymphoma However, even though not reported, the size of her liver and spleen are mildly enlarged I suspect she might have a component of liver disease and sequestration causing thrombocytopenia The most likely causes ITP and she has responded well with just very low dose of prednisone therapy at 20 mg daily I will also order myeloma panel in her next visit to exclude monoclonal paraproteinemia as a cause of thrombocytopenia I recommend reducing prednisone to 10 mg daily and recheck next week If she could not tolerate prednisone taper, we can consider rituximab therapy The goal is to keep her platelet count greater than 50,000   Bilateral leg edema This is related to fluid retention I recommend low-salt diet, leg elevation and exercise as tolerated  Hives Hives has resolved with low-dose prednisone therapy We will monitor carefully   Orders Placed This Encounter  Procedures  . CBC with Differential/Platelet    Standing Status:   Future    Standing Expiration Date:   10/24/2018  . CMP ([facility] only)    Standing Status:   Future    Standing Expiration Date:   10/24/2018  . Hepatitis B core antibody, IgM    Standing Status:   Future    Standing Expiration Date:   10/24/2018  . Hepatitis B surface antibody,qualitative    Standing Status:   Future    Standing Expiration Date:   10/24/2018  . Hepatitis B surface antigen    Standing Status:   Future    Standing Expiration Date:   10/24/2018    INTERVAL HISTORY: Claudia Moore 84 y.o. female returns for further follow-up related to thrombocytopenia With low-dose prednisone therapy, her skin hives has resolved She feels well except for sensation of jittery with prednisone therapy She has also noted some ankle swelling The patient denies any recent signs or symptoms of bleeding such as  spontaneous epistaxis, hematuria or hematochezia. She denies recent fever or chills  SUMMARY OF HEMATOLOGIC HISTORY:  She was last seen in 2017, after referral for abnormal CBC Her total white blood cell count usually range slightly higher than upper limits of normal, between 11.5-12.9  With associated mild thrombocytopenia. Differential confirm mild neutrophilia She was recommend observation only She was subsequently referred back to me in August 2019 due to progressive thrombocytopenia. CT scan showed no evidence of lymphoma.  She has responded well to low-dose prednisone therapy that was prescribed to treat her hives  I have reviewed the past medical history, past surgical history, social history and family history with the patient and they are unchanged from previous note.  ALLERGIES:  is allergic to pine; celebrex [celecoxib]; and zyrtec [cetirizine].  MEDICATIONS:  Current Outpatient Medications  Medication Sig Dispense Refill  . atenolol (TENORMIN) 25 MG tablet Take 25 mg by mouth daily.  0  . Cholecalciferol (VITAMIN D PO) Take 5,000 Units by mouth.    . escitalopram (LEXAPRO) 10 MG tablet Take 10 mg by mouth daily.  3  . levothyroxine (SYNTHROID, LEVOTHROID) 100 MCG tablet Take 100 mcg by mouth daily.  3  . lisinopril-hydrochlorothiazide (PRINZIDE,ZESTORETIC) 20-25 MG tablet Take 1 tablet by mouth daily.  0  . predniSONE (DELTASONE) 20 MG tablet Take 1 tablet (20 mg total) by mouth daily with breakfast. 30 tablet 0   No current facility-administered medications for this visit.      REVIEW  OF SYSTEMS:   Constitutional: Denies fevers, chills or night sweats Eyes: Denies blurriness of vision Ears, nose, mouth, throat, and face: Denies mucositis or sore throat Respiratory: Denies cough, dyspnea or wheezes Cardiovascular: Denies palpitation, chest discomfort Gastrointestinal:  Denies nausea, heartburn or change in bowel habits Lymphatics: Denies new lymphadenopathy or easy  bruising Neurological:Denies numbness, tingling or new weaknesses Behavioral/Psych: Mood is stable, no new changes  All other systems were reviewed with the patient and are negative.  PHYSICAL EXAMINATION: ECOG PERFORMANCE STATUS: 0 - Asymptomatic  Vitals:   09/17/17 1444  BP: 128/71  Pulse: (!) 58  Resp: 17  Temp: (!) 97.5 F (36.4 C)  SpO2: 95%   Filed Weights   09/17/17 1444  Weight: 179 lb 11.2 oz (81.5 kg)    GENERAL:alert, no distress and comfortable SKIN: skin color, texture, turgor are normal, no rashes or significant lesions.  Previous skin hives has resolved EYES: normal, Conjunctiva are pink and non-injected, sclera clear OROPHARYNX:no exudate, no erythema and lips, buccal mucosa, and tongue normal  NECK: supple, thyroid normal size, non-tender, without nodularity LYMPH:  no palpable lymphadenopathy in the cervical, axillary or inguinal LUNGS: clear to auscultation and percussion with normal breathing effort HEART: regular rate & rhythm and no murmurs with mild bilateral lower extremity edema ABDOMEN:abdomen soft, non-tender and normal bowel sounds Musculoskeletal:no cyanosis of digits and no clubbing  NEURO: alert & oriented x 3 with fluent speech, no focal motor/sensory deficits  LABORATORY DATA:  I have reviewed the data as listed     Component Value Date/Time   NA 139 09/14/2017 0812   K 4.1 09/14/2017 0812   CL 99 09/14/2017 0812   CO2 28 09/14/2017 0812   GLUCOSE 95 09/14/2017 0812   BUN 25 (H) 09/14/2017 0812   CREATININE 1.15 (H) 09/14/2017 0812   CALCIUM 10.1 09/14/2017 0812   PROT 7.0 09/14/2017 0812   ALBUMIN 4.0 09/14/2017 0812   AST 36 09/14/2017 0812   ALT 44 09/14/2017 0812   ALKPHOS 43 09/14/2017 0812   BILITOT 0.9 09/14/2017 0812   GFRNONAA 43 (L) 09/14/2017 0812   GFRAA 50 (L) 09/14/2017 0812    No results found for: SPEP, UPEP  Lab Results  Component Value Date   WBC 6.6 09/14/2017   NEUTROABS 4.7 09/14/2017   HGB 14.6  09/14/2017   HCT 42.7 09/14/2017   MCV 95.7 09/14/2017   PLT 67 (L) 09/14/2017      Chemistry      Component Value Date/Time   NA 139 09/14/2017 0812   K 4.1 09/14/2017 0812   CL 99 09/14/2017 0812   CO2 28 09/14/2017 0812   BUN 25 (H) 09/14/2017 0812   CREATININE 1.15 (H) 09/14/2017 0812      Component Value Date/Time   CALCIUM 10.1 09/14/2017 0812   ALKPHOS 43 09/14/2017 0812   AST 36 09/14/2017 0812   ALT 44 09/14/2017 0812   BILITOT 0.9 09/14/2017 0812       RADIOGRAPHIC STUDIES: I have review of CT imaging with the patient and her husband I have personally reviewed the radiological images as listed and agreed with the findings in the report.  I spent 15 minutes counseling the patient face to face. The total time spent in the appointment was 20 minutes and more than 50% was on counseling.   All questions were answered. The patient knows to call the clinic with any problems, questions or concerns. No barriers to learning was detected.    Havyn Ramo  Alvy Bimler, MD 8/21/20197:00 AM

## 2017-09-19 NOTE — Assessment & Plan Note (Signed)
This is related to fluid retention I recommend low-salt diet, leg elevation and exercise as tolerated

## 2017-09-19 NOTE — Assessment & Plan Note (Signed)
Hives has resolved with low-dose prednisone therapy We will monitor carefully

## 2017-09-19 NOTE — Assessment & Plan Note (Addendum)
CT scan showed no evidence of lymphoma However, even though not reported, the size of her liver and spleen are mildly enlarged I suspect she might have a component of liver disease and sequestration causing thrombocytopenia The most likely causes ITP and she has responded well with just very low dose of prednisone therapy at 20 mg daily I will also order myeloma panel in her next visit to exclude monoclonal paraproteinemia as a cause of thrombocytopenia I recommend reducing prednisone to 10 mg daily and recheck next week If she could not tolerate prednisone taper, we can consider rituximab therapy The goal is to keep her platelet count greater than 50,000

## 2017-09-25 ENCOUNTER — Telehealth: Payer: Self-pay | Admitting: Hematology and Oncology

## 2017-09-25 ENCOUNTER — Other Ambulatory Visit: Payer: Self-pay | Admitting: Hematology and Oncology

## 2017-09-25 ENCOUNTER — Inpatient Hospital Stay (HOSPITAL_BASED_OUTPATIENT_CLINIC_OR_DEPARTMENT_OTHER): Payer: Medicare Other | Admitting: Hematology and Oncology

## 2017-09-25 ENCOUNTER — Inpatient Hospital Stay: Payer: Medicare Other

## 2017-09-25 DIAGNOSIS — M109 Gout, unspecified: Secondary | ICD-10-CM | POA: Diagnosis not present

## 2017-09-25 DIAGNOSIS — R600 Localized edema: Secondary | ICD-10-CM | POA: Diagnosis not present

## 2017-09-25 DIAGNOSIS — E790 Hyperuricemia without signs of inflammatory arthritis and tophaceous disease: Secondary | ICD-10-CM

## 2017-09-25 DIAGNOSIS — L509 Urticaria, unspecified: Secondary | ICD-10-CM | POA: Diagnosis not present

## 2017-09-25 DIAGNOSIS — C8590 Non-Hodgkin lymphoma, unspecified, unspecified site: Secondary | ICD-10-CM

## 2017-09-25 DIAGNOSIS — D696 Thrombocytopenia, unspecified: Secondary | ICD-10-CM

## 2017-09-25 DIAGNOSIS — D693 Immune thrombocytopenic purpura: Secondary | ICD-10-CM | POA: Diagnosis not present

## 2017-09-25 DIAGNOSIS — Z79899 Other long term (current) drug therapy: Secondary | ICD-10-CM | POA: Diagnosis not present

## 2017-09-25 LAB — CMP (CANCER CENTER ONLY)
ALT: 50 U/L — ABNORMAL HIGH (ref 0–44)
AST: 31 U/L (ref 15–41)
Albumin: 3.8 g/dL (ref 3.5–5.0)
Alkaline Phosphatase: 39 U/L (ref 38–126)
Anion gap: 8 (ref 5–15)
BUN: 38 mg/dL — ABNORMAL HIGH (ref 8–23)
CO2: 26 mmol/L (ref 22–32)
Calcium: 9.5 mg/dL (ref 8.9–10.3)
Chloride: 105 mmol/L (ref 98–111)
Creatinine: 1.22 mg/dL — ABNORMAL HIGH (ref 0.44–1.00)
GFR, Est AFR Am: 46 mL/min — ABNORMAL LOW (ref >60)
GFR, Estimated: 40 mL/min — ABNORMAL LOW (ref >60)
GLUCOSE: 121 mg/dL — AB (ref 70–99)
Potassium: 4 mmol/L (ref 3.5–5.1)
SODIUM: 139 mmol/L (ref 135–145)
Total Bilirubin: 0.7 mg/dL (ref 0.3–1.2)
Total Protein: 6.6 g/dL (ref 6.5–8.1)

## 2017-09-25 LAB — CBC WITH DIFFERENTIAL/PLATELET
Basophils Absolute: 0 10*3/uL (ref 0.0–0.1)
Basophils Relative: 0 %
EOS ABS: 0 10*3/uL (ref 0.0–0.5)
EOS PCT: 0 %
HCT: 39.5 % (ref 34.8–46.6)
Hemoglobin: 13.5 g/dL (ref 11.6–15.9)
Lymphocytes Relative: 9 %
Lymphs Abs: 0.7 10*3/uL — ABNORMAL LOW (ref 0.9–3.3)
MCH: 32.7 pg (ref 25.1–34.0)
MCHC: 34.2 g/dL (ref 31.5–36.0)
MCV: 95.6 fL (ref 79.5–101.0)
Monocytes Absolute: 0.1 10*3/uL (ref 0.1–0.9)
Monocytes Relative: 1 %
NEUTROS PCT: 90 %
Neutro Abs: 7 10*3/uL — ABNORMAL HIGH (ref 1.5–6.5)
PLATELETS: 36 10*3/uL — AB (ref 145–400)
RBC: 4.13 MIL/uL (ref 3.70–5.45)
RDW: 14.1 % (ref 11.2–14.5)
WBC: 7.8 10*3/uL (ref 3.9–10.3)

## 2017-09-25 LAB — LACTATE DEHYDROGENASE: LDH: 132 U/L (ref 98–192)

## 2017-09-25 LAB — URIC ACID: URIC ACID, SERUM: 11 mg/dL — AB (ref 2.5–7.1)

## 2017-09-25 NOTE — Telephone Encounter (Signed)
Gave avs and calendar without IVIG. Waiting for permission to add. There was no treatment plan

## 2017-09-26 ENCOUNTER — Encounter: Payer: Self-pay | Admitting: Hematology and Oncology

## 2017-09-26 ENCOUNTER — Telehealth: Payer: Self-pay | Admitting: Hematology and Oncology

## 2017-09-26 DIAGNOSIS — E790 Hyperuricemia without signs of inflammatory arthritis and tophaceous disease: Secondary | ICD-10-CM | POA: Insufficient documentation

## 2017-09-26 LAB — KAPPA/LAMBDA LIGHT CHAINS
Kappa free light chain: 22.3 mg/L — ABNORMAL HIGH (ref 3.3–19.4)
Kappa, lambda light chain ratio: 1.53 (ref 0.26–1.65)
Lambda free light chains: 14.6 mg/L (ref 5.7–26.3)

## 2017-09-26 LAB — MULTIPLE MYELOMA PANEL, SERUM
ALPHA2 GLOB SERPL ELPH-MCNC: 0.7 g/dL (ref 0.4–1.0)
Albumin SerPl Elph-Mcnc: 3.8 g/dL (ref 2.9–4.4)
Albumin/Glob SerPl: 1.5 (ref 0.7–1.7)
Alpha 1: 0.2 g/dL (ref 0.0–0.4)
B-Globulin SerPl Elph-Mcnc: 1 g/dL (ref 0.7–1.3)
Gamma Glob SerPl Elph-Mcnc: 0.7 g/dL (ref 0.4–1.8)
Globulin, Total: 2.6 g/dL (ref 2.2–3.9)
IGG (IMMUNOGLOBIN G), SERUM: 745 mg/dL (ref 700–1600)
IgA: 161 mg/dL (ref 64–422)
IgM (Immunoglobulin M), Srm: 41 mg/dL (ref 26–217)
TOTAL PROTEIN ELP: 6.4 g/dL (ref 6.0–8.5)

## 2017-09-26 LAB — HEPATITIS B CORE ANTIBODY, IGM: Hep B C IgM: NEGATIVE

## 2017-09-26 LAB — HEPATITIS B SURFACE ANTIBODY,QUALITATIVE: HEP B S AB: NONREACTIVE

## 2017-09-26 LAB — HEPATITIS B SURFACE ANTIGEN: HEP B S AG: NEGATIVE

## 2017-09-26 NOTE — Assessment & Plan Note (Signed)
She has significant hyperuricemia and is at risk of gout attack I recommend rasburicase next week

## 2017-09-26 NOTE — Assessment & Plan Note (Signed)
Unfortunately, with recent prednisone taper, she has recurrence of severe ITP She is currently not symptomatic After a prolonged discussion, we are in agreement to proceed with increasing the dose of prednisone back to 20 mg daily Previously, she describes sensation of jitteriness but today, she did not recall that being a problem I plan to see her back next week for further follow-up If her platelet count dropped to less than 10,000, she would need platelet transfusion We discussed the role of IVIG, rituximab, Nplate or Promacta at second line treatment for refractory ITP I will tentatively schedule her IVIG next week in case her platelet count does not improve with increasing dose of prednisone She agree with the plan of care

## 2017-09-26 NOTE — Progress Notes (Signed)
[place] OFFICE PROGRESS NOTE  [name], [name], MD  ASSESSMENT & PLAN:  Chronic ITP (idiopathic thrombocytopenia) ([facility]) Unfortunately, with recent prednisone taper, she has recurrence of severe ITP She is currently not symptomatic After a prolonged discussion, we are in agreement to proceed with increasing the dose of prednisone back to 20 mg daily Previously, she describes sensation of jitteriness but today, she did not recall that being a problem I plan to see her back next week for further follow-up If her platelet count dropped to less than 10,000, she would need platelet transfusion We discussed the role of IVIG, rituximab, Nplate or Promacta at second line treatment for refractory ITP I will tentatively schedule her IVIG next week in case her platelet count does not improve with increasing dose of prednisone She agree with the plan of care  Hyperuricemia She has significant hyperuricemia and is at risk of gout attack I recommend rasburicase next week   Orders Placed This Encounter  Procedures  . CBC with Differential/Platelet    Standing Status:   Standing    Number of Occurrences:   9    Standing Expiration Date:   09/26/2018  . Comprehensive metabolic panel    Standing Status:   Future    Standing Expiration Date:   10/31/2018  . Uric acid    Standing Status:   Future    Standing Expiration Date:   10/31/2018  . Lactate dehydrogenase    Standing Status:   Future    Standing Expiration Date:   10/31/2018    INTERVAL HISTORY: Claudia Moore 84 y.o. female returns for with her husband for further follow-up She tolerated reduced dose prednisone well She complained of intermittent joint pain No recent recurrence of skin rashes The patient denies any recent signs or symptoms of bleeding such as spontaneous epistaxis, hematuria or hematochezia. Denies significant bilateral lower extremity edema.  In fact, it has improved with reduced dose prednisone  SUMMARY  OF HEMATOLOGIC HISTORY:  She was last seen in 2017, after referral for abnormal CBC Her total white blood cell count usually range slightly higher than upper limits of normal, between 11.5-12.9  With associated mild thrombocytopenia. Differential confirm mild neutrophilia She was recommend observation only She was subsequently referred back to me in August 2019 due to progressive thrombocytopenia. CT scan showed no evidence of lymphoma.  She has responded well to low-dose prednisone therapy that was prescribed to treat her hives  I have reviewed the past medical history, past surgical history, social history and family history with the patient and they are unchanged from previous note.  ALLERGIES:  is allergic to pine; celebrex [celecoxib]; and zyrtec [cetirizine].  MEDICATIONS:  Current Outpatient Medications  Medication Sig Dispense Refill  . atenolol (TENORMIN) 25 MG tablet Take 25 mg by mouth daily.  0  . Cholecalciferol (VITAMIN D PO) Take 5,000 Units by mouth.    . escitalopram (LEXAPRO) 10 MG tablet Take 10 mg by mouth daily.  3  . levothyroxine (SYNTHROID, LEVOTHROID) 100 MCG tablet Take 100 mcg by mouth daily.  3  . lisinopril-hydrochlorothiazide (PRINZIDE,ZESTORETIC) 20-25 MG tablet Take 1 tablet by mouth daily.  0  . predniSONE (DELTASONE) 20 MG tablet Take 1 tablet (20 mg total) by mouth daily with breakfast. 30 tablet 0   No current facility-administered medications for this visit.      REVIEW OF SYSTEMS:   Constitutional: Denies fevers, chills or night sweats Eyes: Denies blurriness of vision Ears, nose, mouth, throat,  and face: Denies mucositis or sore throat Respiratory: Denies cough, dyspnea or wheezes Cardiovascular: Denies palpitation, chest discomfort  Gastrointestinal:  Denies nausea, heartburn or change in bowel habits Skin: Denies abnormal skin rashes Lymphatics: Denies new lymphadenopathy or easy bruising Neurological:Denies numbness, tingling or new  weaknesses Behavioral/Psych: Mood is stable, no new changes  All other systems were reviewed with the patient and are negative.  PHYSICAL EXAMINATION: ECOG PERFORMANCE STATUS: 1 - Symptomatic but completely ambulatory  Vitals:   09/25/17 1428  BP: 102/60  Pulse: (!) 57  Resp: 18  Temp: 98.4 F (36.9 C)  SpO2: 96%   Filed Weights   09/25/17 1428  Weight: 179 lb (81.2 kg)    GENERAL:alert, no distress and comfortable SKIN: skin color, texture, turgor are normal, no rashes or significant lesions HEART:mild bilateral lower extremity edema NEURO: alert & oriented x 3 with fluent speech, no focal motor/sensory deficits  LABORATORY DATA:  I have reviewed the data as listed     Component Value Date/Time   NA 139 09/25/2017 1343   K 4.0 09/25/2017 1343   CL 105 09/25/2017 1343   CO2 26 09/25/2017 1343   GLUCOSE 121 (H) 09/25/2017 1343   BUN 38 (H) 09/25/2017 1343   CREATININE 1.22 (H) 09/25/2017 1343   CALCIUM 9.5 09/25/2017 1343   PROT 6.6 09/25/2017 1343   ALBUMIN 3.8 09/25/2017 1343   AST 31 09/25/2017 1343   ALT 50 (H) 09/25/2017 1343   ALKPHOS 39 09/25/2017 1343   BILITOT 0.7 09/25/2017 1343   GFRNONAA 40 (L) 09/25/2017 1343   GFRAA 46 (L) 09/25/2017 1343    No results found for: SPEP, UPEP  Lab Results  Component Value Date   WBC 7.8 09/25/2017   NEUTROABS 7.0 (H) 09/25/2017   HGB 13.5 09/25/2017   HCT 39.5 09/25/2017   MCV 95.6 09/25/2017   PLT 36 (L) 09/25/2017      Chemistry      Component Value Date/Time   NA 139 09/25/2017 1343   K 4.0 09/25/2017 1343   CL 105 09/25/2017 1343   CO2 26 09/25/2017 1343   BUN 38 (H) 09/25/2017 1343   CREATININE 1.22 (H) 09/25/2017 1343      Component Value Date/Time   CALCIUM 9.5 09/25/2017 1343   ALKPHOS 39 09/25/2017 1343   AST 31 09/25/2017 1343   ALT 50 (H) 09/25/2017 1343   BILITOT 0.7 09/25/2017 1343      I spent 15 minutes counseling the patient face to face. The total time spent in the appointment  was 20 minutes and more than 50% was on counseling.   All questions were answered. The patient knows to call the clinic with any problems, questions or concerns. No barriers to learning was detected.    Heath Lark, MD 8/28/20192:35 PM

## 2017-09-26 NOTE — Telephone Encounter (Signed)
Tried to reach regarding 9/4 I did leave a message

## 2017-10-02 ENCOUNTER — Telehealth: Payer: Self-pay | Admitting: Hematology and Oncology

## 2017-10-02 ENCOUNTER — Inpatient Hospital Stay (HOSPITAL_BASED_OUTPATIENT_CLINIC_OR_DEPARTMENT_OTHER): Payer: Medicare Other | Admitting: Hematology and Oncology

## 2017-10-02 ENCOUNTER — Inpatient Hospital Stay: Payer: Medicare Other

## 2017-10-02 ENCOUNTER — Encounter: Payer: Self-pay | Admitting: Hematology and Oncology

## 2017-10-02 ENCOUNTER — Inpatient Hospital Stay: Payer: Medicare Other | Attending: Hematology and Oncology

## 2017-10-02 VITALS — BP 106/48 | HR 59 | Temp 97.7°F | Resp 18 | Ht 62.0 in | Wt 176.8 lb

## 2017-10-02 DIAGNOSIS — Z79899 Other long term (current) drug therapy: Secondary | ICD-10-CM | POA: Diagnosis not present

## 2017-10-02 DIAGNOSIS — R600 Localized edema: Secondary | ICD-10-CM | POA: Diagnosis not present

## 2017-10-02 DIAGNOSIS — D693 Immune thrombocytopenic purpura: Secondary | ICD-10-CM

## 2017-10-02 DIAGNOSIS — Z23 Encounter for immunization: Secondary | ICD-10-CM | POA: Insufficient documentation

## 2017-10-02 DIAGNOSIS — E790 Hyperuricemia without signs of inflammatory arthritis and tophaceous disease: Secondary | ICD-10-CM

## 2017-10-02 LAB — CBC WITH DIFFERENTIAL/PLATELET
BASOS PCT: 1 %
Basophils Absolute: 0.1 10*3/uL (ref 0.0–0.1)
Eosinophils Absolute: 0 10*3/uL (ref 0.0–0.5)
Eosinophils Relative: 1 %
HEMATOCRIT: 39.9 % (ref 34.8–46.6)
HEMOGLOBIN: 13.8 g/dL (ref 11.6–15.9)
Lymphocytes Relative: 25 %
Lymphs Abs: 1.8 10*3/uL (ref 0.9–3.3)
MCH: 32.2 pg (ref 25.1–34.0)
MCHC: 34.4 g/dL (ref 31.5–36.0)
MCV: 93.5 fL (ref 79.5–101.0)
MONO ABS: 0.1 10*3/uL (ref 0.1–0.9)
MONOS PCT: 1 %
NEUTROS ABS: 5.1 10*3/uL (ref 1.5–6.5)
NEUTROS PCT: 72 %
Platelets: 55 10*3/uL — ABNORMAL LOW (ref 145–400)
RBC: 4.27 MIL/uL (ref 3.70–5.45)
RDW: 13.5 % (ref 11.2–14.5)
WBC: 7.1 10*3/uL (ref 3.9–10.3)

## 2017-10-02 LAB — LACTATE DEHYDROGENASE: LDH: 126 U/L (ref 98–192)

## 2017-10-02 LAB — COMPREHENSIVE METABOLIC PANEL
ALBUMIN: 4.1 g/dL (ref 3.5–5.0)
ALK PHOS: 34 U/L — AB (ref 38–126)
ALT: 45 U/L — ABNORMAL HIGH (ref 0–44)
ANION GAP: 11 (ref 5–15)
AST: 25 U/L (ref 15–41)
BILIRUBIN TOTAL: 1 mg/dL (ref 0.3–1.2)
BUN: 30 mg/dL — AB (ref 8–23)
CO2: 23 mmol/L (ref 22–32)
Calcium: 9.9 mg/dL (ref 8.9–10.3)
Chloride: 105 mmol/L (ref 98–111)
Creatinine, Ser: 1.12 mg/dL — ABNORMAL HIGH (ref 0.44–1.00)
GFR calc Af Amer: 52 mL/min — ABNORMAL LOW (ref >60)
GFR, EST NON AFRICAN AMERICAN: 44 mL/min — AB (ref >60)
GLUCOSE: 88 mg/dL (ref 70–99)
POTASSIUM: 3.9 mmol/L (ref 3.5–5.1)
Sodium: 139 mmol/L (ref 135–145)
TOTAL PROTEIN: 6.8 g/dL (ref 6.5–8.1)

## 2017-10-02 LAB — URIC ACID: URIC ACID, SERUM: 10 mg/dL — AB (ref 2.5–7.1)

## 2017-10-02 MED ORDER — SODIUM CHLORIDE 0.9 % IV SOLN
Freq: Once | INTRAVENOUS | Status: AC
  Administered 2017-10-02: 11:00:00 via INTRAVENOUS
  Filled 2017-10-02: qty 250

## 2017-10-02 MED ORDER — SODIUM CHLORIDE 0.9 % IV SOLN
3.0000 mg | Freq: Once | INTRAVENOUS | Status: AC
  Administered 2017-10-02: 3 mg via INTRAVENOUS
  Filled 2017-10-02: qty 2

## 2017-10-02 NOTE — Progress Notes (Signed)
[place] OFFICE PROGRESS NOTE  [name], [name], MD  ASSESSMENT & PLAN:  Chronic ITP (idiopathic thrombocytopenia) ([facility]) She has persistent thrombocytopenia She has multiple side effects from prednisone including fluid retention I recommend IVIG tomorrow The risk, benefits, side effects of IVIG were discussed with the patient and husband and she agreed to proceed I plan to see her next week If her platelet count improves, I will initiate prednisone taper  Hyperuricemia She has significant hyperuricemia I recommend rasburicase I plan to start her on allopurinol next week if uric acid level improves  Bilateral leg edema This is due to fluid retention I recommend low salt intake and diuretic therapy as needed   Orders Placed This Encounter  Procedures  . Comprehensive metabolic panel    Standing Status:   Standing    Number of Occurrences:   9    Standing Expiration Date:   10/03/2018  . Uric acid    Standing Status:   Standing    Number of Occurrences:   9    Standing Expiration Date:   10/03/2018    INTERVAL HISTORY: Claudia Moore 84 y.o. female returns for for further follow-up She tolerated 20 mg prednisone well She has some mild fluid retention, improved with leg elevation and elastic compression hose The patient denies any recent signs or symptoms of bleeding such as spontaneous epistaxis, hematuria or hematochezia.  SUMMARY OF HEMATOLOGIC HISTORY:  She was last seen in 2017, after referral for abnormal CBC Her total white blood cell count usually range slightly higher than upper limits of normal, between 11.5-12.9  With associated mild thrombocytopenia. Differential confirm mild neutrophilia She was recommend observation only She was subsequently referred back to me in August 2019 due to progressive thrombocytopenia. CT scan showed no evidence of lymphoma.  She has responded well to low-dose prednisone therapy that was prescribed to treat her hives  I  have reviewed the past medical history, past surgical history, social history and family history with the patient and they are unchanged from previous note.  ALLERGIES:  is allergic to pine; celebrex [celecoxib]; and zyrtec [cetirizine].  MEDICATIONS:  Current Outpatient Medications  Medication Sig Dispense Refill  . atenolol (TENORMIN) 25 MG tablet Take 25 mg by mouth daily.  0  . Cholecalciferol (VITAMIN D PO) Take 5,000 Units by mouth.    . escitalopram (LEXAPRO) 10 MG tablet Take 10 mg by mouth daily.  3  . levothyroxine (SYNTHROID, LEVOTHROID) 100 MCG tablet Take 100 mcg by mouth daily.  3  . lisinopril-hydrochlorothiazide (PRINZIDE,ZESTORETIC) 20-25 MG tablet Take 1 tablet by mouth daily.  0  . predniSONE (DELTASONE) 20 MG tablet Take 1 tablet (20 mg total) by mouth daily with breakfast. 30 tablet 0   No current facility-administered medications for this visit.      REVIEW OF SYSTEMS:   Constitutional: Denies fevers, chills or night sweats Eyes: Denies blurriness of vision Ears, nose, mouth, throat, and face: Denies mucositis or sore throat Respiratory: Denies cough, dyspnea or wheezes Cardiovascular: Denies palpitation, chest discomfort or lower extremity swelling Gastrointestinal:  Denies nausea, heartburn or change in bowel habits Skin: Denies abnormal skin rashes Lymphatics: Denies new lymphadenopathy or easy bruising Neurological:Denies numbness, tingling or new weaknesses Behavioral/Psych: Mood is stable, no new changes  All other systems were reviewed with the patient and are negative.  PHYSICAL EXAMINATION: ECOG PERFORMANCE STATUS: 1 - Symptomatic but completely ambulatory  Vitals:   10/02/17 0918  BP: (!) 106/48  Pulse: (!) 59  Resp: 18  Temp: 97.7 F (36.5 C)  SpO2: 97%   Filed Weights   10/02/17 0918  Weight: 176 lb 12.8 oz (80.2 kg)    GENERAL:alert, no distress and comfortable SKIN: skin color, texture, turgor are normal, no rashes or significant  lesions HEART: bilateral lower extremity edema NEURO: alert & oriented x 3 with fluent speech, no focal motor/sensory deficits  LABORATORY DATA:  I have reviewed the data as listed     Component Value Date/Time   NA 139 10/02/2017 0842   K 3.9 10/02/2017 0842   CL 105 10/02/2017 0842   CO2 23 10/02/2017 0842   GLUCOSE 88 10/02/2017 0842   BUN 30 (H) 10/02/2017 0842   CREATININE 1.12 (H) 10/02/2017 0842   CREATININE 1.22 (H) 09/25/2017 1343   CALCIUM 9.9 10/02/2017 0842   PROT 6.8 10/02/2017 0842   ALBUMIN 4.1 10/02/2017 0842   AST 25 10/02/2017 0842   AST 31 09/25/2017 1343   ALT 45 (H) 10/02/2017 0842   ALT 50 (H) 09/25/2017 1343   ALKPHOS 34 (L) 10/02/2017 0842   BILITOT 1.0 10/02/2017 0842   BILITOT 0.7 09/25/2017 1343   GFRNONAA 44 (L) 10/02/2017 0842   GFRNONAA 40 (L) 09/25/2017 1343   GFRAA 52 (L) 10/02/2017 0842   GFRAA 46 (L) 09/25/2017 1343    No results found for: SPEP, UPEP  Lab Results  Component Value Date   WBC 7.1 10/02/2017   NEUTROABS 5.1 10/02/2017   HGB 13.8 10/02/2017   HCT 39.9 10/02/2017   MCV 93.5 10/02/2017   PLT 55 (L) 10/02/2017      Chemistry      Component Value Date/Time   NA 139 10/02/2017 0842   K 3.9 10/02/2017 0842   CL 105 10/02/2017 0842   CO2 23 10/02/2017 0842   BUN 30 (H) 10/02/2017 0842   CREATININE 1.12 (H) 10/02/2017 0842   CREATININE 1.22 (H) 09/25/2017 1343      Component Value Date/Time   CALCIUM 9.9 10/02/2017 0842   ALKPHOS 34 (L) 10/02/2017 0842   AST 25 10/02/2017 0842   AST 31 09/25/2017 1343   ALT 45 (H) 10/02/2017 0842   ALT 50 (H) 09/25/2017 1343   BILITOT 1.0 10/02/2017 0842   BILITOT 0.7 09/25/2017 1343      I spent 15 minutes counseling the patient face to face. The total time spent in the appointment was 20 minutes and more than 50% was on counseling.   All questions were answered. The patient knows to call the clinic with any problems, questions or concerns. No barriers to learning was  detected.    Heath Lark, MD 9/3/20192:02 PM

## 2017-10-02 NOTE — Patient Instructions (Signed)
[facility] Discharge Instructions for Patients Receiving Chemotherapy  Today you received the following chemotherapy agents Rasburicase  To help prevent nausea and vomiting after your treatment, we encourage you to take your nausea medication as prescribed.   If you develop nausea and vomiting that is not controlled by your nausea medication, call the clinic.   BELOW ARE SYMPTOMS THAT SHOULD BE REPORTED IMMEDIATELY:  *FEVER GREATER THAN 100.5 F  *CHILLS WITH OR WITHOUT FEVER  NAUSEA AND VOMITING THAT IS NOT CONTROLLED WITH YOUR NAUSEA MEDICATION  *UNUSUAL SHORTNESS OF BREATH  *UNUSUAL BRUISING OR BLEEDING  TENDERNESS IN MOUTH AND THROAT WITH OR WITHOUT PRESENCE OF ULCERS  *URINARY PROBLEMS  *BOWEL PROBLEMS  UNUSUAL RASH Items with * indicate a potential emergency and should be followed up as soon as possible.  Feel free to call the clinic should you have any questions or concerns. The clinic phone number is [phone].  Please show the [facility] at check-in to the Emergency Department and triage nurse.   Rasburicase Injection What is this medicine? RASBURICASE (ras BURE i kase) breaks down uric acid in the blood. It is used to prevent and to treat high levels of uric acid caused by cancer treatment. This medicine may be used for other purposes; ask your health care provider or pharmacist if you have questions. COMMON BRAND NAME(S): Elitek What should I tell my health care provider before I take this medicine? They need to know if you have any of these conditions: -G6PD deficiency -history of anemia -history of blood transfusion -an unusual or allergic reaction to rasburicase, yeast, mannitol, other medicines, foods, dyes, or preservatives -pregnant or trying to get pregnant -breast-feeding How should I use this medicine? This medicine is for infusion into a vein. It is given by a health care professional in a hospital or clinic setting. Talk  to your pediatrician regarding the use of this medicine in children. While this drug may be prescribed for children as young as 9 month old for selected conditions, precautions do apply. Overdosage: If you think you have taken too much of this medicine contact a poison control center or emergency room at once. NOTE: This medicine is only for you. Do not share this medicine with others. What if I miss a dose? This does not apply. What may interact with this medicine? Interactions have not been studied. Give your health care provider a list of all the medicines, herbs, non-prescription drugs, or dietary supplements you use. Also tell them if you smoke, drink alcohol, or use illegal drugs. Some items may interact with your medicine. This list may not describe all possible interactions. Give your health care provider a list of all the medicines, herbs, non-prescription drugs, or dietary supplements you use. Also tell them if you smoke, drink alcohol, or use illegal drugs. Some items may interact with your medicine. What should I watch for while using this medicine? Your condition will be monitored carefully while you are receiving this medicine. You will need to have regular blood tests during your treatment. What side effects may I notice from receiving this medicine? Side effects that you should report to your doctor or health care professional as soon as possible: -allergic reactions like skin rash, itching or hives, swelling of the face, lips, or tongue -blue color to lips or nailbeds -breathing problems -chest pain, tightness -confusion -fast, irregular heartbeat -feeling faint or lightheaded, falls -low blood pressure -seizures -unusually weak or tired -yellowing of the eyes or  skin Side effects that usually do not require medical attention (report to your doctor or health care professional if they continue or are bothersome): -abdominal  pain -constipation -diarrhea -fever -headache -nausea, vomiting -swelling of the ankles, feet, hands This list may not describe all possible side effects. Call your doctor for medical advice about side effects. You may report side effects to FDA at 1-800-FDA-1088. Where should I keep my medicine? This drug is given in a hospital or clinic and will not be stored at home. NOTE: This sheet is a summary. It may not cover all possible information. If you have questions about this medicine, talk to your doctor, pharmacist, or health care provider.  2018 Elsevier/Gold Standard (2013-07-11 13:24:23)

## 2017-10-02 NOTE — Assessment & Plan Note (Signed)
This is due to fluid retention I recommend low salt intake and diuretic therapy as needed

## 2017-10-02 NOTE — Assessment & Plan Note (Signed)
She has persistent thrombocytopenia She has multiple side effects from prednisone including fluid retention I recommend IVIG tomorrow The risk, benefits, side effects of IVIG were discussed with the patient and husband and she agreed to proceed I plan to see her next week If her platelet count improves, I will initiate prednisone taper

## 2017-10-02 NOTE — Assessment & Plan Note (Signed)
She has significant hyperuricemia I recommend rasburicase I plan to start her on allopurinol next week if uric acid level improves

## 2017-10-02 NOTE — Telephone Encounter (Signed)
Gave pt avs and calendar  °

## 2017-10-03 ENCOUNTER — Inpatient Hospital Stay: Payer: Medicare Other

## 2017-10-03 VITALS — BP 94/48 | HR 59 | Temp 97.9°F | Resp 16

## 2017-10-03 DIAGNOSIS — E790 Hyperuricemia without signs of inflammatory arthritis and tophaceous disease: Secondary | ICD-10-CM | POA: Diagnosis not present

## 2017-10-03 DIAGNOSIS — R600 Localized edema: Secondary | ICD-10-CM | POA: Diagnosis not present

## 2017-10-03 DIAGNOSIS — Z23 Encounter for immunization: Secondary | ICD-10-CM | POA: Diagnosis not present

## 2017-10-03 DIAGNOSIS — D693 Immune thrombocytopenic purpura: Secondary | ICD-10-CM

## 2017-10-03 DIAGNOSIS — Z79899 Other long term (current) drug therapy: Secondary | ICD-10-CM | POA: Diagnosis not present

## 2017-10-03 MED ORDER — DEXTROSE 5 % IV SOLN
Freq: Once | INTRAVENOUS | Status: AC
  Administered 2017-10-03: 08:00:00 via INTRAVENOUS
  Filled 2017-10-03: qty 250

## 2017-10-03 MED ORDER — ACETAMINOPHEN 325 MG PO TABS
650.0000 mg | ORAL_TABLET | Freq: Once | ORAL | Status: AC
  Administered 2017-10-03: 650 mg via ORAL

## 2017-10-03 MED ORDER — DIPHENHYDRAMINE HCL 25 MG PO CAPS
ORAL_CAPSULE | ORAL | Status: AC
  Filled 2017-10-03: qty 1

## 2017-10-03 MED ORDER — GAMMAGARD 10 GM/100ML IJ SOLN
80.0000 g | Freq: Once | INTRAMUSCULAR | Status: AC
  Administered 2017-10-03: 80 g via INTRAVENOUS
  Filled 2017-10-03: qty 800

## 2017-10-03 MED ORDER — ACETAMINOPHEN 325 MG PO TABS
ORAL_TABLET | ORAL | Status: AC
  Filled 2017-10-03: qty 2

## 2017-10-03 MED ORDER — IMMUNE GLOBULIN (HUMAN) 20 GM/200ML IV SOLN: 1.0000 g/kg | Freq: Once | INTRAVENOUS | Status: DC

## 2017-10-03 MED ORDER — DIPHENHYDRAMINE HCL 25 MG PO TABS
25.0000 mg | ORAL_TABLET | Freq: Once | ORAL | Status: AC
  Administered 2017-10-03: 25 mg via ORAL
  Filled 2017-10-03: qty 1

## 2017-10-03 NOTE — Patient Instructions (Signed)

## 2017-10-04 ENCOUNTER — Other Ambulatory Visit: Payer: Self-pay | Admitting: Hematology and Oncology

## 2017-10-04 ENCOUNTER — Telehealth: Payer: Self-pay | Admitting: *Deleted

## 2017-10-04 NOTE — Telephone Encounter (Signed)
Received vm message from patient @ 8:20 am regarding side effects she experienced last night after her 1st IVIG. TCT patient and spoke with her. She stated that her treatment went well and was pleased with that but after she got home she started getting a headache (5pm). This headache got progressively worse during the night but now has lessened in intensity.  She did not take anything for the headache. She also experienced chills around 7-8pm but no fever until this am. This morning her temp is 99.9. Her other side effect was some nausea and sweating, no vomiting.  That has passed. Advised pt that these side effects are not unusual and that over time her body will become adjusted to the IVIG.  Advised pt to take tylenol for headache, and to keep well hydrated-64 oz a day.. Both of these will help diminish her SE.  If symptoms persist, advised pt to call back.  Pt voiced understanding of above instructions and expressed relief at knowing that these SE are normal.  "I feel like I was hit by a truck!"  She states she will be drinking water and has no problem doing that.  Pt states she will call back if she is not feeling batter later today.

## 2017-10-10 ENCOUNTER — Inpatient Hospital Stay (HOSPITAL_BASED_OUTPATIENT_CLINIC_OR_DEPARTMENT_OTHER): Payer: Medicare Other | Admitting: Hematology and Oncology

## 2017-10-10 ENCOUNTER — Encounter: Payer: Self-pay | Admitting: Hematology and Oncology

## 2017-10-10 ENCOUNTER — Telehealth: Payer: Self-pay | Admitting: Hematology and Oncology

## 2017-10-10 ENCOUNTER — Inpatient Hospital Stay: Payer: Medicare Other

## 2017-10-10 DIAGNOSIS — R600 Localized edema: Secondary | ICD-10-CM

## 2017-10-10 DIAGNOSIS — D693 Immune thrombocytopenic purpura: Secondary | ICD-10-CM

## 2017-10-10 DIAGNOSIS — E790 Hyperuricemia without signs of inflammatory arthritis and tophaceous disease: Secondary | ICD-10-CM | POA: Diagnosis not present

## 2017-10-10 DIAGNOSIS — Z79899 Other long term (current) drug therapy: Secondary | ICD-10-CM | POA: Diagnosis not present

## 2017-10-10 DIAGNOSIS — Z23 Encounter for immunization: Secondary | ICD-10-CM | POA: Diagnosis not present

## 2017-10-10 DIAGNOSIS — T8069XA Other serum reaction due to other serum, initial encounter: Secondary | ICD-10-CM | POA: Insufficient documentation

## 2017-10-10 LAB — CBC WITH DIFFERENTIAL/PLATELET
Basophils Absolute: 0 10*3/uL (ref 0.0–0.1)
Basophils Relative: 0 %
EOS ABS: 0 10*3/uL (ref 0.0–0.5)
EOS PCT: 1 %
HCT: 38.3 % (ref 34.8–46.6)
Hemoglobin: 13.1 g/dL (ref 11.6–15.9)
LYMPHS ABS: 1.5 10*3/uL (ref 0.9–3.3)
LYMPHS PCT: 24 %
MCH: 32 pg (ref 25.1–34.0)
MCHC: 34.2 g/dL (ref 31.5–36.0)
MCV: 93.6 fL (ref 79.5–101.0)
MONO ABS: 0.1 10*3/uL (ref 0.1–0.9)
MONOS PCT: 1 %
Neutro Abs: 4.7 10*3/uL (ref 1.5–6.5)
Neutrophils Relative %: 74 %
Platelets: 50 10*3/uL — ABNORMAL LOW (ref 145–400)
RBC: 4.09 MIL/uL (ref 3.70–5.45)
RDW: 13.7 % (ref 11.2–14.5)
WBC: 6.3 10*3/uL (ref 3.9–10.3)

## 2017-10-10 LAB — COMPREHENSIVE METABOLIC PANEL
ALT: 35 U/L (ref 0–44)
ANION GAP: 11 (ref 5–15)
AST: 27 U/L (ref 15–41)
Albumin: 3.7 g/dL (ref 3.5–5.0)
Alkaline Phosphatase: 33 U/L — ABNORMAL LOW (ref 38–126)
BUN: 34 mg/dL — ABNORMAL HIGH (ref 8–23)
CHLORIDE: 102 mmol/L (ref 98–111)
CO2: 25 mmol/L (ref 22–32)
CREATININE: 1.21 mg/dL — AB (ref 0.44–1.00)
Calcium: 9.9 mg/dL (ref 8.9–10.3)
GFR, EST AFRICAN AMERICAN: 47 mL/min — AB (ref >60)
GFR, EST NON AFRICAN AMERICAN: 41 mL/min — AB (ref >60)
Glucose, Bld: 79 mg/dL (ref 70–99)
POTASSIUM: 4 mmol/L (ref 3.5–5.1)
Sodium: 138 mmol/L (ref 135–145)
Total Bilirubin: 0.9 mg/dL (ref 0.3–1.2)
Total Protein: 7.6 g/dL (ref 6.5–8.1)

## 2017-10-10 LAB — URIC ACID: URIC ACID, SERUM: 8.7 mg/dL — AB (ref 2.5–7.1)

## 2017-10-10 NOTE — Assessment & Plan Note (Signed)
Unfortunately, she had no response to IVIG. Her platelet count currently is stable at 50,000 with low-dose prednisone and she does not have bleeding complications I spent an extensive amount of time educating the patient and family the risk, benefits, side effects of rituximab, immunosuppresant therapy such as CellCept, Promacta or Nplate or splenectomy. The patient is currently undecided We will regroup again next week for further follow-up and discussion about plan of care.  For now, she does not need platelet transfusion.

## 2017-10-10 NOTE — Assessment & Plan Note (Signed)
This is due to fluid retention I recommend low salt intake and diuretic therapy as needed

## 2017-10-10 NOTE — Assessment & Plan Note (Signed)
She has persistent hyperuricemia but not symptomatic I recommend another dose of rasburicase next week.

## 2017-10-10 NOTE — Telephone Encounter (Signed)
Gave avs and calendar ° °

## 2017-10-10 NOTE — Assessment & Plan Note (Signed)
Her symptoms had almost completely resolved since last week Since she has no response to IVIG, we will discontinue treatment.  Continue supportive care.

## 2017-10-10 NOTE — Progress Notes (Signed)
[facility] OFFICE PROGRESS NOTE  [name], [name], MD  ASSESSMENT & PLAN:  Chronic ITP (idiopathic thrombocytopenia) (HCC) Unfortunately, she had no response to IVIG. Her platelet count currently is stable at 50,000 with low-dose prednisone and she does not have bleeding complications I spent an extensive amount of time educating the patient and family the risk, benefits, side effects of rituximab, immunosuppresant therapy such as CellCept, Promacta or Nplate or splenectomy. The patient is currently undecided We will regroup again next week for further follow-up and discussion about plan of care.  For now, she does not need platelet transfusion.  Hyperuricemia She has persistent hyperuricemia but not symptomatic I recommend another dose of rasburicase next week.  Serum sickness due to drug Her symptoms had almost completely resolved since last week Since she has no response to IVIG, we will discontinue treatment.  Continue supportive care.  Bilateral leg edema This is due to fluid retention I recommend low salt intake and diuretic therapy as needed   No orders of the defined types were placed in this encounter.   INTERVAL HISTORY: [facility]ia Moore 84 y.o. female returns for follow-up on history of ITP She developed serum sickness after IVIG but her symptoms have resolved Her leg swelling has improved The patient denies any recent signs or symptoms of bleeding such as spontaneous epistaxis, hematuria or hematochezia.  SUMMARY OF HEMATOLOGIC HISTORY:  She was last seen in 2017, after referral for abnormal CBC Her total white blood cell count usually range slightly higher than upper limits of normal, between 11.5-12.9  With associated mild thrombocytopenia. Differential confirm mild neutrophilia She was recommend observation only She was subsequently referred back to me in August 2019 due to progressive thrombocytopenia. CT scan showed no evidence of lymphoma.  She has  responded well to low-dose prednisone therapy that was prescribed to treat her hives On 10/03/2017, she received 1 dose of IVIG with no response to therapy.  She also developed serum sickness after that.  I have reviewed the past medical history, past surgical history, social history and family history with the patient and they are unchanged from previous note.  ALLERGIES:  is allergic to pine; celebrex [celecoxib]; and zyrtec [cetirizine].  MEDICATIONS:  Current Outpatient Medications  Medication Sig Dispense Refill  . atenolol (TENORMIN) 25 MG tablet Take 25 mg by mouth daily.  0  . Cholecalciferol (VITAMIN D PO) Take 5,000 Units by mouth.    . escitalopram (LEXAPRO) 10 MG tablet Take 10 mg by mouth daily.  3  . levothyroxine (SYNTHROID, LEVOTHROID) 100 MCG tablet Take 100 mcg by mouth daily.  3  . lisinopril-hydrochlorothiazide (PRINZIDE,ZESTORETIC) 20-25 MG tablet Take 1 tablet by mouth daily.  0  . predniSONE (DELTASONE) 10 MG tablet TAKE 2 TABLETS BY MOUTH DAILY WITH BREAKFAST 60 tablet 0   No current facility-administered medications for this visit.      REVIEW OF SYSTEMS:   Constitutional: Denies fevers, chills or night sweats Eyes: Denies blurriness of vision Ears, nose, mouth, throat, and face: Denies mucositis or sore throat Respiratory: Denies cough, dyspnea or wheezes Cardiovascular: Denies palpitation, chest discomfort  Gastrointestinal:  Denies nausea, heartburn or change in bowel habits Skin: Denies abnormal skin rashes Lymphatics: Denies new lymphadenopathy or easy bruising Neurological:Denies numbness, tingling or new weaknesses Behavioral/Psych: Mood is stable, no new changes  All other systems were reviewed with the patient and are negative.  PHYSICAL EXAMINATION: ECOG PERFORMANCE STATUS: 1 - Symptomatic but completely ambulatory  Vitals:   10/10/17  0829  BP: (!) 103/54  Pulse: (!) 58  Resp: 16  Temp: 98.4 F (36.9 C)  SpO2: 97%   Filed Weights    10/10/17 0829  Weight: 176 lb (79.8 kg)    GENERAL:alert, no distress and comfortable SKIN: skin color, texture, turgor are normal, no rashes or significant lesions HEART: Stable bilateral  lower extremity edema NEURO: alert & oriented x 3 with fluent speech, no focal motor/sensory deficits  LABORATORY DATA:  I have reviewed the data as listed     Component Value Date/Time   NA 138 10/10/2017 0752   K 4.0 10/10/2017 0752   CL 102 10/10/2017 0752   CO2 25 10/10/2017 0752   GLUCOSE 79 10/10/2017 0752   BUN 34 (H) 10/10/2017 0752   CREATININE 1.21 (H) 10/10/2017 0752   CREATININE 1.22 (H) 09/25/2017 1343   CALCIUM 9.9 10/10/2017 0752   PROT 7.6 10/10/2017 0752   ALBUMIN 3.7 10/10/2017 0752   AST 27 10/10/2017 0752   AST 31 09/25/2017 1343   ALT 35 10/10/2017 0752   ALT 50 (H) 09/25/2017 1343   ALKPHOS 33 (L) 10/10/2017 0752   BILITOT 0.9 10/10/2017 0752   BILITOT 0.7 09/25/2017 1343   GFRNONAA 41 (L) 10/10/2017 0752   GFRNONAA 40 (L) 09/25/2017 1343   GFRAA 47 (L) 10/10/2017 0752   GFRAA 46 (L) 09/25/2017 1343    No results found for: SPEP, UPEP  Lab Results  Component Value Date   WBC 6.3 10/10/2017   NEUTROABS 4.7 10/10/2017   HGB 13.1 10/10/2017   HCT 38.3 10/10/2017   MCV 93.6 10/10/2017   PLT 50 (L) 10/10/2017      Chemistry      Component Value Date/Time   NA 138 10/10/2017 0752   K 4.0 10/10/2017 0752   CL 102 10/10/2017 0752   CO2 25 10/10/2017 0752   BUN 34 (H) 10/10/2017 0752   CREATININE 1.21 (H) 10/10/2017 0752   CREATININE 1.22 (H) 09/25/2017 1343      Component Value Date/Time   CALCIUM 9.9 10/10/2017 0752   ALKPHOS 33 (L) 10/10/2017 0752   AST 27 10/10/2017 0752   AST 31 09/25/2017 1343   ALT 35 10/10/2017 0752   ALT 50 (H) 09/25/2017 1343   BILITOT 0.9 10/10/2017 0752   BILITOT 0.7 09/25/2017 1343      I spent 25 minutes counseling the patient face to face. The total time spent in the appointment was 30 minutes and more than 50%  was on counseling.   All questions were answered. The patient knows to call the clinic with any problems, questions or concerns. No barriers to learning was detected.    Heath Lark, MD 9/11/201911:57 AM

## 2017-10-15 ENCOUNTER — Inpatient Hospital Stay: Payer: Medicare Other

## 2017-10-15 ENCOUNTER — Inpatient Hospital Stay (HOSPITAL_BASED_OUTPATIENT_CLINIC_OR_DEPARTMENT_OTHER): Payer: Medicare Other | Admitting: Hematology and Oncology

## 2017-10-15 ENCOUNTER — Telehealth: Payer: Self-pay | Admitting: Hematology and Oncology

## 2017-10-15 DIAGNOSIS — D693 Immune thrombocytopenic purpura: Secondary | ICD-10-CM

## 2017-10-15 DIAGNOSIS — Z7189 Other specified counseling: Secondary | ICD-10-CM

## 2017-10-15 DIAGNOSIS — E790 Hyperuricemia without signs of inflammatory arthritis and tophaceous disease: Secondary | ICD-10-CM | POA: Diagnosis not present

## 2017-10-15 DIAGNOSIS — R600 Localized edema: Secondary | ICD-10-CM | POA: Diagnosis not present

## 2017-10-15 DIAGNOSIS — Z23 Encounter for immunization: Secondary | ICD-10-CM | POA: Diagnosis not present

## 2017-10-15 DIAGNOSIS — T8069XA Other serum reaction due to other serum, initial encounter: Secondary | ICD-10-CM

## 2017-10-15 DIAGNOSIS — Z79899 Other long term (current) drug therapy: Secondary | ICD-10-CM | POA: Diagnosis not present

## 2017-10-15 LAB — URIC ACID: Uric Acid, Serum: 10 mg/dL — ABNORMAL HIGH (ref 2.5–7.1)

## 2017-10-15 LAB — CBC WITH DIFFERENTIAL/PLATELET
BASOS PCT: 1 %
Basophils Absolute: 0.1 10*3/uL (ref 0.0–0.1)
EOS ABS: 0 10*3/uL (ref 0.0–0.5)
EOS PCT: 1 %
HCT: 36.1 % (ref 34.8–46.6)
Hemoglobin: 12.4 g/dL (ref 11.6–15.9)
Lymphocytes Relative: 24 %
Lymphs Abs: 1.5 10*3/uL (ref 0.9–3.3)
MCH: 32.5 pg (ref 25.1–34.0)
MCHC: 34.5 g/dL (ref 31.5–36.0)
MCV: 94.2 fL (ref 79.5–101.0)
MONO ABS: 0.1 10*3/uL (ref 0.1–0.9)
Monocytes Relative: 1 %
Neutro Abs: 4.6 10*3/uL (ref 1.5–6.5)
Neutrophils Relative %: 73 %
PLATELETS: 55 10*3/uL — AB (ref 145–400)
RBC: 3.83 MIL/uL (ref 3.70–5.45)
RDW: 13.9 % (ref 11.2–14.5)
WBC: 6.3 10*3/uL (ref 3.9–10.3)

## 2017-10-15 LAB — COMPREHENSIVE METABOLIC PANEL
ALK PHOS: 29 U/L — AB (ref 38–126)
ALT: 33 U/L (ref 0–44)
AST: 22 U/L (ref 15–41)
Albumin: 3.6 g/dL (ref 3.5–5.0)
Anion gap: 9 (ref 5–15)
BILIRUBIN TOTAL: 0.8 mg/dL (ref 0.3–1.2)
BUN: 36 mg/dL — ABNORMAL HIGH (ref 8–23)
CALCIUM: 9.7 mg/dL (ref 8.9–10.3)
CO2: 27 mmol/L (ref 22–32)
CREATININE: 1.16 mg/dL — AB (ref 0.44–1.00)
Chloride: 103 mmol/L (ref 98–111)
GFR calc Af Amer: 49 mL/min — ABNORMAL LOW (ref >60)
GFR, EST NON AFRICAN AMERICAN: 43 mL/min — AB (ref >60)
Glucose, Bld: 87 mg/dL (ref 70–99)
POTASSIUM: 4.5 mmol/L (ref 3.5–5.1)
Sodium: 139 mmol/L (ref 135–145)
TOTAL PROTEIN: 7.4 g/dL (ref 6.5–8.1)

## 2017-10-15 MED ORDER — SODIUM CHLORIDE 0.9 % IV SOLN
3.0000 mg | Freq: Once | INTRAVENOUS | Status: AC
  Administered 2017-10-15: 3 mg via INTRAVENOUS
  Filled 2017-10-15: qty 2

## 2017-10-15 MED ORDER — SODIUM CHLORIDE 0.9 % IV SOLN
Freq: Once | INTRAVENOUS | Status: AC
  Administered 2017-10-15: 12:00:00 via INTRAVENOUS
  Filled 2017-10-15: qty 250

## 2017-10-15 MED ORDER — INFLUENZA VAC SPLIT QUAD 0.5 ML IM SUSY
PREFILLED_SYRINGE | INTRAMUSCULAR | Status: AC
  Filled 2017-10-15: qty 0.5

## 2017-10-15 NOTE — Telephone Encounter (Signed)
Gave avs and calendar ° °

## 2017-10-15 NOTE — Patient Instructions (Signed)
Rasburicase Injection (Elitek) What is this medicine? RASBURICASE (ras BURE i kase) breaks down uric acid in the blood. It is used to prevent and to treat high levels of uric acid caused by cancer treatment. This medicine may be used for other purposes; ask your health care provider or pharmacist if you have questions. COMMON BRAND NAME(S): Elitek What should I tell my health care provider before I take this medicine? They need to know if you have any of these conditions: -G6PD deficiency -history of anemia -history of blood transfusion -an unusual or allergic reaction to rasburicase, yeast, mannitol, other medicines, foods, dyes, or preservatives -pregnant or trying to get pregnant -breast-feeding How should I use this medicine? This medicine is for infusion into a vein. It is given by a health care professional in a hospital or clinic setting. Talk to your pediatrician regarding the use of this medicine in children. While this drug may be prescribed for children as young as 42 month old for selected conditions, precautions do apply. Overdosage: If you think you have taken too much of this medicine contact a poison control center or emergency room at once. NOTE: This medicine is only for you. Do not share this medicine with others. What if I miss a dose? This does not apply. What may interact with this medicine? Interactions have not been studied. Give your health care provider a list of all the medicines, herbs, non-prescription drugs, or dietary supplements you use. Also tell them if you smoke, drink alcohol, or use illegal drugs. Some items may interact with your medicine. This list may not describe all possible interactions. Give your health care provider a list of all the medicines, herbs, non-prescription drugs, or dietary supplements you use. Also tell them if you smoke, drink alcohol, or use illegal drugs. Some items may interact with your medicine. What should I watch for while using  this medicine? Your condition will be monitored carefully while you are receiving this medicine. You will need to have regular blood tests during your treatment. What side effects may I notice from receiving this medicine? Side effects that you should report to your doctor or health care professional as soon as possible: -allergic reactions like skin rash, itching or hives, swelling of the face, lips, or tongue -blue color to lips or nailbeds -breathing problems -chest pain, tightness -confusion -fast, irregular heartbeat -feeling faint or lightheaded, falls -low blood pressure -seizures -unusually weak or tired -yellowing of the eyes or skin Side effects that usually do not require medical attention (report to your doctor or health care professional if they continue or are bothersome): -abdominal pain -constipation -diarrhea -fever -headache -nausea, vomiting -swelling of the ankles, feet, hands This list may not describe all possible side effects. Call your doctor for medical advice about side effects. You may report side effects to FDA at 1-800-FDA-1088. Where should I keep my medicine? This drug is given in a hospital or clinic and will not be stored at home. NOTE: This sheet is a summary. It may not cover all possible information. If you have questions about this medicine, talk to your doctor, pharmacist, or health care provider.  2018 Elsevier/Gold Standard (2013-07-11 13:24:23)

## 2017-10-15 NOTE — Progress Notes (Signed)
START OFF PATHWAY REGIMEN - Other Dx   [id number]:Rituximab (Weekly):   Administer weekly:     Rituximab   **Always confirm dose/schedule in your pharmacy ordering system**  Patient Characteristics: Intent of Therapy: Non-Curative / Palliative Intent, Discussed with Patient 

## 2017-10-15 NOTE — Patient Instructions (Signed)

## 2017-10-16 ENCOUNTER — Encounter: Payer: Self-pay | Admitting: Hematology and Oncology

## 2017-10-16 DIAGNOSIS — Z7189 Other specified counseling: Secondary | ICD-10-CM | POA: Insufficient documentation

## 2017-10-16 NOTE — Assessment & Plan Note (Signed)
She has persistent hyperuricemia but not symptomatic I recommend another dose of rasburicase

## 2017-10-16 NOTE — Assessment & Plan Note (Signed)
Unfortunately, she has lack of response on prednisone therapy Again, we discussed risk, benefits, side effects of various treatment option and ultimately, she agreed to proceed with single agent weekly rituximab x4 I would recommend she continues on prednisone therapy and I do not intend to taper her until we see some response from rituximab For now, she will continue on low-dose prednisone. We discussed the importance of preventive care and reviewed the vaccination programs. She does not have any prior allergic reactions to influenza vaccination. She agrees to proceed with influenza vaccination today and we will administer it today at the clinic.

## 2017-10-16 NOTE — Assessment & Plan Note (Signed)
We discussed goals of the care of therapy Unfortunately, chronic ITP I rarely curable but it is treatable The patient's appear to be very tearful today I tried to reassure her that treatment with rituximab in general is well-tolerated

## 2017-10-16 NOTE — Assessment & Plan Note (Signed)
This is due to fluid retention I recommend low salt intake and diuretic therapy as needed

## 2017-10-16 NOTE — Progress Notes (Signed)
[place] OFFICE PROGRESS NOTE  [name], [name], MD  ASSESSMENT & PLAN:  Chronic ITP (idiopathic thrombocytopenia) (HCC) Unfortunately, she has lack of response on prednisone therapy Again, we discussed risk, benefits, side effects of various treatment option and ultimately, she agreed to proceed with single agent weekly rituximab x4 I would recommend she continues on prednisone therapy and I do not intend to taper her until we see some response from rituximab For now, she will continue on low-dose prednisone. We discussed the importance of preventive care and reviewed the vaccination programs. She does not have any prior allergic reactions to influenza vaccination. She agrees to proceed with influenza vaccination today and we will administer it today at the clinic.   Bilateral leg edema This is due to fluid retention I recommend low salt intake and diuretic therapy as needed  Hyperuricemia She has persistent hyperuricemia but not symptomatic I recommend another dose of rasburicase   Serum sickness due to drug Patient has recovered from serum sickness.  We have discontinued IVIG permanently.  Goals of care, counseling/discussion We discussed goals of the care of therapy Unfortunately, chronic ITP I rarely curable but it is treatable The patient's appear to be very tearful today I tried to reassure her that treatment with rituximab in general is well-tolerated   No orders of the defined types were placed in this encounter.   INTERVAL HISTORY: Claudia Moore 84 y.o. female returns for further discussion about management of ITP She has recover from recent serum sickness No recurrence of skin rashes No recent infection, fever or chills Her chronic leg edema is stable. The patient denies any recent signs or symptoms of bleeding such as spontaneous epistaxis, hematuria or hematochezia.   SUMMARY OF HEMATOLOGIC HISTORY:  She was last seen in 2017, after referral  for abnormal CBC Her total white blood cell count usually range slightly higher than upper limits of normal, between 11.5-12.9  With associated mild thrombocytopenia. Differential confirm mild neutrophilia She was recommend observation only She was subsequently referred back to me in August 2019 due to progressive thrombocytopenia. CT scan showed no evidence of lymphoma.  She has responded well to low-dose prednisone therapy that was prescribed to treat her hives On 10/03/2017, she received 1 dose of IVIG with no response to therapy.  She also developed serum sickness after that.  I have reviewed the past medical history, past surgical history, social history and family history with the patient and they are unchanged from previous note.  ALLERGIES:  is allergic to pine; celebrex [celecoxib]; and zyrtec [cetirizine].  MEDICATIONS:  Current Outpatient Medications  Medication Sig Dispense Refill  . atenolol (TENORMIN) 25 MG tablet Take 25 mg by mouth daily.  0  . Cholecalciferol (VITAMIN D PO) Take 2,000 Units by mouth.    . escitalopram (LEXAPRO) 10 MG tablet Take 10 mg by mouth daily.  3  . levothyroxine (SYNTHROID, LEVOTHROID) 100 MCG tablet Take 100 mcg by mouth daily.  3  . lisinopril-hydrochlorothiazide (PRINZIDE,ZESTORETIC) 20-25 MG tablet Take 1 tablet by mouth daily.  0  . predniSONE (DELTASONE) 10 MG tablet TAKE 2 TABLETS BY MOUTH DAILY WITH BREAKFAST 60 tablet 0   No current facility-administered medications for this visit.      REVIEW OF SYSTEMS:   Constitutional: Denies fevers, chills or night sweats Eyes: Denies blurriness of vision Ears, nose, mouth, throat, and face: Denies mucositis or sore throat Respiratory: Denies cough, dyspnea or wheezes Cardiovascular: Denies palpitation, chest discomfort  Gastrointestinal:  Denies nausea, heartburn or change in bowel habits Skin: Denies abnormal skin rashes Lymphatics: Denies new lymphadenopathy or easy bruising Neurological:Denies  numbness, tingling or new weaknesses Behavioral/Psych: Mood is stable, no new changes  All other systems were reviewed with the patient and are negative.  PHYSICAL EXAMINATION: ECOG PERFORMANCE STATUS: 1 - Symptomatic but completely ambulatory  Vitals:   10/15/17 0823  BP: (!) 113/53  Pulse: (!) 58  Resp: 18  Temp: 97.9 F (36.6 C)  SpO2: 95%   Filed Weights   10/15/17 0823  Weight: 176 lb 12.8 oz (80.2 kg)    GENERAL:alert, no distress and comfortable.  She is intermittently tearful  HEART: Noted stable bilateral lower extremity edema ABDOMEN:abdomen soft, non-tender and normal bowel sounds Musculoskeletal:no cyanosis of digits and no clubbing  NEURO: alert & oriented x 3 with fluent speech, no focal motor/sensory deficits  LABORATORY DATA:  I have reviewed the data as listed     Component Value Date/Time   NA 139 10/15/2017 0753   K 4.5 10/15/2017 0753   CL 103 10/15/2017 0753   CO2 27 10/15/2017 0753   GLUCOSE 87 10/15/2017 0753   BUN 36 (H) 10/15/2017 0753   CREATININE 1.16 (H) 10/15/2017 0753   CREATININE 1.22 (H) 09/25/2017 1343   CALCIUM 9.7 10/15/2017 0753   PROT 7.4 10/15/2017 0753   ALBUMIN 3.6 10/15/2017 0753   AST 22 10/15/2017 0753   AST 31 09/25/2017 1343   ALT 33 10/15/2017 0753   ALT 50 (H) 09/25/2017 1343   ALKPHOS 29 (L) 10/15/2017 0753   BILITOT 0.8 10/15/2017 0753   BILITOT 0.7 09/25/2017 1343   GFRNONAA 43 (L) 10/15/2017 0753   GFRNONAA 40 (L) 09/25/2017 1343   GFRAA 49 (L) 10/15/2017 0753   GFRAA 46 (L) 09/25/2017 1343    No results found for: SPEP, UPEP  Lab Results  Component Value Date   WBC 6.3 10/15/2017   NEUTROABS 4.6 10/15/2017   HGB 12.4 10/15/2017   HCT 36.1 10/15/2017   MCV 94.2 10/15/2017   PLT 55 (L) 10/15/2017      Chemistry      Component Value Date/Time   NA 139 10/15/2017 0753   K 4.5 10/15/2017 0753   CL 103 10/15/2017 0753   CO2 27 10/15/2017 0753   BUN 36 (H) 10/15/2017 0753   CREATININE 1.16 (H)  10/15/2017 0753   CREATININE 1.22 (H) 09/25/2017 1343      Component Value Date/Time   CALCIUM 9.7 10/15/2017 0753   ALKPHOS 29 (L) 10/15/2017 0753   AST 22 10/15/2017 0753   AST 31 09/25/2017 1343   ALT 33 10/15/2017 0753   ALT 50 (H) 09/25/2017 1343   BILITOT 0.8 10/15/2017 0753   BILITOT 0.7 09/25/2017 1343       I spent 25 minutes counseling the patient face to face. The total time spent in the appointment was 30 minutes and more than 50% was on counseling.   All questions were answered. The patient knows to call the clinic with any problems, questions or concerns. No barriers to learning was detected.    Heath Lark, MD 9/17/20195:21 PM

## 2017-10-16 NOTE — Assessment & Plan Note (Signed)
Patient has recovered from serum sickness.  We have discontinued IVIG permanently.

## 2017-10-17 ENCOUNTER — Telehealth: Payer: Self-pay

## 2017-10-17 NOTE — Telephone Encounter (Signed)
Husband called to inform that new insurance will require PA for Promacta.  Pt wishes to cancel 9/20 lab appt and 9/23 Rituxan infusion appt to proceed with PA.  Msg sent to Dr Alvy Bimler and Horton Chin

## 2017-10-19 ENCOUNTER — Inpatient Hospital Stay: Payer: Medicare Other

## 2017-10-22 ENCOUNTER — Inpatient Hospital Stay: Payer: Medicare Other

## 2017-10-22 ENCOUNTER — Other Ambulatory Visit: Payer: Self-pay | Admitting: Hematology and Oncology

## 2017-10-22 MED ORDER — ELTROMBOPAG OLAMINE 25 MG PO TABS: 25.0000 mg | ORAL_TABLET | Freq: Every day | ORAL | 9 refills | Status: DC

## 2017-10-23 ENCOUNTER — Telehealth: Payer: Self-pay | Admitting: Pharmacist

## 2017-10-23 ENCOUNTER — Encounter: Payer: Self-pay | Admitting: Pharmacist

## 2017-10-23 DIAGNOSIS — D693 Immune thrombocytopenic purpura: Secondary | ICD-10-CM

## 2017-10-23 DIAGNOSIS — Z7189 Other specified counseling: Secondary | ICD-10-CM

## 2017-10-23 MED ORDER — ELTROMBOPAG OLAMINE 25 MG PO TABS: 25.0000 mg | ORAL_TABLET | Freq: Every day | ORAL | 9 refills | Status: DC

## 2017-10-23 MED FILL — PROMACTA 25 MG TABLET: 25 | 30 days supply | Qty: 30 | Fill #0

## 2017-10-23 NOTE — Telephone Encounter (Signed)
Oral Chemotherapy Pharmacist Encounter   I spoke with patient for overview of: Promacta (eltrombopag).   Counseled patient on administration, dosing, side effects, monitoring, drug-food interactions, safe handling, storage, and disposal.  Patient will take Promacta 25mg  tablets, 1 tablet by mouth once daily on an empty stomach, 1 hour before or 2 hours after a meal.  Patient counseled to administer Promacta at least 2 hours before, or 4 hours after antacids, foods high in calcium, or vitamin supplements (iron, calcium, aluminum, magnesium, selinium, zinc). Patient does take a multivitamin daily and will move this to bedtime dosing to ensure it is separated from her Promacta.  Promacta start date: 10/24/17  Adverse effects include but are not limited to: fatigue, diarrhea, nausea, pruritis, decreased blood counts, hepatotoxicity, flu-like symptoms, myalgias, fever, and peripheral edema.    Reviewed with patient importance of keeping a medication schedule and plan for any missed doses.  Mrs. Mangini voiced understanding and appreciation.   All questions answered. Medication reconciliation performed and medication/allergy list updated.  Patient instructed to continue on prednisone until she is seen in the office by Dr. Calton Dach next Monday (10/29/2017). Patient instructed that she will likely continue on rituximab until we are able to get her platelet count under better control.  Insurance authorization is not required for Promacta. Test claim at the pharmacy revealed $25 copayment for 30-day supply.  This is affordable to patient. Patient will pick up her Promacta from the [facility] today (10/23/2017) and she will start her first dose of Promacta tomorrow morning.  Patient knows to call the office with questions or concerns. Oral Oncology Clinic will continue to follow.  Johny Drilling, PharmD, BCPS, BCOP  10/23/2017   9:49 AM Oral Oncology Clinic [phone]

## 2017-10-23 NOTE — Telephone Encounter (Signed)
Oral Oncology Pharmacist Encounter  Received new prescription for Promacta (eltrombopag) for the treatment of chronic idiopathic thrombocytopenia, planned duration until disease progression or unacceptable toxicity.  Patient without response to therapy with IVIG. Patient now currently on prednisone and rituximab.  Labs from 10/15/17 assessed, OK for treatment. SCr=1.16, est CrCl ~ 45 mL/min, no dose adjustment provided by manufacturer in renal dysfunction.  Current medication list in Epic reviewed, no DDIs with Promacta identified.  Prescription has been e-scribed to the [facility] for benefits analysis and approval.  Oral Oncology Clinic will continue to follow for insurance authorization, copayment issues, initial counseling and start date.  Johny Drilling, PharmD, BCPS, BCOP  10/23/2017 8:17 AM Oral Oncology Clinic [phone]

## 2017-10-24 NOTE — Telephone Encounter (Signed)
Oral Oncology Patient Advocate Encounter  Confirmed with [place] that Antony Blackbird was picked up on 10/23/17 with a $25 copay.   [place] Patient [place] Phone [phone] Fax [phone]

## 2017-10-25 DIAGNOSIS — L900 Lichen sclerosus et atrophicus: Secondary | ICD-10-CM | POA: Diagnosis not present

## 2017-10-25 DIAGNOSIS — D229 Melanocytic nevi, unspecified: Secondary | ICD-10-CM | POA: Diagnosis not present

## 2017-10-25 DIAGNOSIS — L820 Inflamed seborrheic keratosis: Secondary | ICD-10-CM | POA: Diagnosis not present

## 2017-10-27 ENCOUNTER — Other Ambulatory Visit: Payer: Self-pay | Admitting: Hematology and Oncology

## 2017-10-29 ENCOUNTER — Inpatient Hospital Stay: Payer: Medicare Other

## 2017-10-29 ENCOUNTER — Telehealth: Payer: Self-pay | Admitting: Hematology and Oncology

## 2017-10-29 ENCOUNTER — Inpatient Hospital Stay (HOSPITAL_BASED_OUTPATIENT_CLINIC_OR_DEPARTMENT_OTHER): Payer: Medicare Other | Admitting: Hematology and Oncology

## 2017-10-29 ENCOUNTER — Ambulatory Visit: Payer: Medicare Other

## 2017-10-29 VITALS — BP 114/62 | HR 77 | Temp 97.5°F | Resp 18 | Ht 62.0 in | Wt 176.4 lb

## 2017-10-29 DIAGNOSIS — E790 Hyperuricemia without signs of inflammatory arthritis and tophaceous disease: Secondary | ICD-10-CM

## 2017-10-29 DIAGNOSIS — Z79899 Other long term (current) drug therapy: Secondary | ICD-10-CM | POA: Diagnosis not present

## 2017-10-29 DIAGNOSIS — D693 Immune thrombocytopenic purpura: Secondary | ICD-10-CM | POA: Diagnosis not present

## 2017-10-29 DIAGNOSIS — Z23 Encounter for immunization: Secondary | ICD-10-CM | POA: Diagnosis not present

## 2017-10-29 DIAGNOSIS — R600 Localized edema: Secondary | ICD-10-CM | POA: Diagnosis not present

## 2017-10-29 DIAGNOSIS — Z299 Encounter for prophylactic measures, unspecified: Secondary | ICD-10-CM

## 2017-10-29 LAB — CBC WITH DIFFERENTIAL/PLATELET
BASOS ABS: 0.1 10*3/uL (ref 0.0–0.1)
BASOS PCT: 1 %
EOS ABS: 0 10*3/uL (ref 0.0–0.5)
EOS PCT: 0 %
HCT: 39 % (ref 34.8–46.6)
Hemoglobin: 13.5 g/dL (ref 11.6–15.9)
Lymphocytes Relative: 20 %
Lymphs Abs: 1.3 10*3/uL (ref 0.9–3.3)
MCH: 33.2 pg (ref 25.1–34.0)
MCHC: 34.5 g/dL (ref 31.5–36.0)
MCV: 96.2 fL (ref 79.5–101.0)
MONO ABS: 0.1 10*3/uL (ref 0.1–0.9)
MONOS PCT: 2 %
Neutro Abs: 5 10*3/uL (ref 1.5–6.5)
Neutrophils Relative %: 77 %
PLATELETS: 66 10*3/uL — AB (ref 145–400)
RBC: 4.06 MIL/uL (ref 3.70–5.45)
RDW: 15.8 % — AB (ref 11.2–14.5)
WBC: 6.6 10*3/uL (ref 3.9–10.3)

## 2017-10-29 LAB — COMPREHENSIVE METABOLIC PANEL
ALBUMIN: 3.9 g/dL (ref 3.5–5.0)
ALT: 34 U/L (ref 0–44)
AST: 20 U/L (ref 15–41)
Alkaline Phosphatase: 29 U/L — ABNORMAL LOW (ref 38–126)
Anion gap: 10 (ref 5–15)
BILIRUBIN TOTAL: 1 mg/dL (ref 0.3–1.2)
BUN: 30 mg/dL — AB (ref 8–23)
CALCIUM: 9.8 mg/dL (ref 8.9–10.3)
CO2: 26 mmol/L (ref 22–32)
CREATININE: 0.98 mg/dL (ref 0.44–1.00)
Chloride: 106 mmol/L (ref 98–111)
GFR calc Af Amer: >60 mL/min (ref >60)
GFR calc non Af Amer: 52 mL/min — ABNORMAL LOW (ref >60)
GLUCOSE: 93 mg/dL (ref 70–99)
Potassium: 3.5 mmol/L (ref 3.5–5.1)
SODIUM: 142 mmol/L (ref 135–145)
TOTAL PROTEIN: 7.1 g/dL (ref 6.5–8.1)

## 2017-10-29 LAB — URIC ACID: Uric Acid, Serum: 9.1 mg/dL — ABNORMAL HIGH (ref 2.5–7.1)

## 2017-10-29 MED ORDER — INFLUENZA VAC SPLIT HIGH-DOSE 0.5 ML IM SUSY
0.5000 mL | PREFILLED_SYRINGE | Freq: Once | INTRAMUSCULAR | Status: AC
  Administered 2017-10-29: 0.5 mL via INTRAMUSCULAR
  Filled 2017-10-29: qty 0.5

## 2017-10-29 NOTE — Telephone Encounter (Signed)
Per 9/30 los, no new orders. Printed avs and calendar only.

## 2017-10-30 ENCOUNTER — Encounter: Payer: Self-pay | Admitting: Hematology and Oncology

## 2017-10-30 DIAGNOSIS — Z299 Encounter for prophylactic measures, unspecified: Secondary | ICD-10-CM | POA: Insufficient documentation

## 2017-10-30 NOTE — Assessment & Plan Note (Signed)
She has persistent hyperuricemia but I suspect it will improve in time For now, we will monitor carefully.  She has received multiple doses of rasburicase

## 2017-10-30 NOTE — Progress Notes (Signed)
[place] OFFICE PROGRESS NOTE  [name], [name], MD  ASSESSMENT & PLAN:  Chronic ITP (idiopathic thrombocytopenia) (HCC) She has responded well to Promacta I recommend continue at 25 mg daily along with prednisone 20 mg daily She will return here next week for repeat blood work. If CBC continues to improve or stable, I plan to reduce the dose of prednisone to 10 mg I plan to see her back in 2 weeks for further supportive care.  Hyperuricemia She has persistent hyperuricemia but I suspect it will improve in time For now, we will monitor carefully.  She has received multiple doses of rasburicase  Bilateral leg edema This is due to fluid retention I recommend low salt intake and diuretic therapy as needed Hopefully, when I stop her prednisone therapy, her fluid retention will resolve  Preventive measure We discussed the importance of preventive care and reviewed the vaccination programs. She does not have any prior allergic reactions to influenza vaccination. She agrees to proceed with influenza vaccination today and we will administer it today at the clinic.    No orders of the defined types were placed in this encounter.   INTERVAL HISTORY: Claudia Moore 84 y.o. female returns for further follow-up for ITP. She started taking Promacta on October 25, 2017 She is doing well Fluid retention is stable She denies recent infection, fever or chills No joint swelling. The patient denies any recent signs or symptoms of bleeding such as spontaneous epistaxis, hematuria or hematochezia.  SUMMARY OF HEMATOLOGIC HISTORY:  She was last seen in 2017, after referral for abnormal CBC Her total white blood cell count usually range slightly higher than upper limits of normal, between 11.5-12.9  With associated mild thrombocytopenia. Differential confirm mild neutrophilia She was recommend observation only She was subsequently referred back to me in August 2019 due to  progressive thrombocytopenia. CT scan showed no evidence of lymphoma.  She has responded well to low-dose prednisone therapy that was prescribed to treat her hives On 10/03/2017, she received 1 dose of IVIG with no response to therapy.  She also developed serum sickness after that. On October 25, 2017, she started on Promacta 25 mg daily  I have reviewed the past medical history, past surgical history, social history and family history with the patient and they are unchanged from previous note.  ALLERGIES:  is allergic to pine; celebrex [celecoxib]; and zyrtec [cetirizine].  MEDICATIONS:  Current Outpatient Medications  Medication Sig Dispense Refill  . atenolol (TENORMIN) 25 MG tablet Take 12.5 mg by mouth daily.  0  . Cholecalciferol (VITAMIN D PO) Take 2,000 Units by mouth.    . eltrombopag (PROMACTA) 25 MG tablet Take 1 tablet (25 mg total) by mouth daily. Take on an empty stomach, 1 hour before a meal or 2 hours after. 30 tablet 9  . escitalopram (LEXAPRO) 10 MG tablet Take 10 mg by mouth daily.  3  . levothyroxine (SYNTHROID, LEVOTHROID) 100 MCG tablet Take 100 mcg by mouth daily.  3  . lisinopril-hydrochlorothiazide (PRINZIDE,ZESTORETIC) 20-25 MG tablet Take 1 tablet by mouth daily.  0  . Multiple Vitamins-Minerals (MULTIVITAMIN WITH MINERALS) tablet Take 1 tablet by mouth at bedtime.    . predniSONE (DELTASONE) 10 MG tablet TAKE 2 TABLETS BY MOUTH DAILY WITH BREAKFAST 60 tablet 0   No current facility-administered medications for this visit.      REVIEW OF SYSTEMS:   Constitutional: Denies fevers, chills or night sweats Eyes: Denies blurriness of vision Ears, nose, mouth,  throat, and face: Denies mucositis or sore throat Respiratory: Denies cough, dyspnea or wheezes Cardiovascular: Denies palpitation, chest discomfort  Gastrointestinal:  Denies nausea, heartburn or change in bowel habits Skin: Denies abnormal skin rashes Lymphatics: Denies new lymphadenopathy or easy  bruising Neurological:Denies numbness, tingling or new weaknesses Behavioral/Psych: Mood is stable, no new changes  All other systems were reviewed with the patient and are negative.  PHYSICAL EXAMINATION: ECOG PERFORMANCE STATUS: 1 - Symptomatic but completely ambulatory  Vitals:   10/29/17 1035  BP: 114/62  Pulse: 77  Resp: 18  Temp: (!) 97.5 F (36.4 C)  SpO2: 99%   Filed Weights   10/29/17 1035  Weight: 176 lb 6.4 oz (80 kg)    GENERAL:alert, no distress and comfortable SKIN: skin color, texture, turgor are normal, no rashes or significant lesions HEART: She has mild bilateral lower extremity edema Musculoskeletal:no cyanosis of digits and no clubbing  NEURO: alert & oriented x 3 with fluent speech, no focal motor/sensory deficits  LABORATORY DATA:  I have reviewed the data as listed     Component Value Date/Time   NA 142 10/29/2017 1012   K 3.5 10/29/2017 1012   CL 106 10/29/2017 1012   CO2 26 10/29/2017 1012   GLUCOSE 93 10/29/2017 1012   BUN 30 (H) 10/29/2017 1012   CREATININE 0.98 10/29/2017 1012   CREATININE 1.22 (H) 09/25/2017 1343   CALCIUM 9.8 10/29/2017 1012   PROT 7.1 10/29/2017 1012   ALBUMIN 3.9 10/29/2017 1012   AST 20 10/29/2017 1012   AST 31 09/25/2017 1343   ALT 34 10/29/2017 1012   ALT 50 (H) 09/25/2017 1343   ALKPHOS 29 (L) 10/29/2017 1012   BILITOT 1.0 10/29/2017 1012   BILITOT 0.7 09/25/2017 1343   GFRNONAA 52 (L) 10/29/2017 1012   GFRNONAA 40 (L) 09/25/2017 1343   GFRAA >60 10/29/2017 1012   GFRAA 46 (L) 09/25/2017 1343    No results found for: SPEP, UPEP  Lab Results  Component Value Date   WBC 6.6 10/29/2017   NEUTROABS 5.0 10/29/2017   HGB 13.5 10/29/2017   HCT 39.0 10/29/2017   MCV 96.2 10/29/2017   PLT 66 (L) 10/29/2017      Chemistry      Component Value Date/Time   NA 142 10/29/2017 1012   K 3.5 10/29/2017 1012   CL 106 10/29/2017 1012   CO2 26 10/29/2017 1012   BUN 30 (H) 10/29/2017 1012   CREATININE 0.98  10/29/2017 1012   CREATININE 1.22 (H) 09/25/2017 1343      Component Value Date/Time   CALCIUM 9.8 10/29/2017 1012   ALKPHOS 29 (L) 10/29/2017 1012   AST 20 10/29/2017 1012   AST 31 09/25/2017 1343   ALT 34 10/29/2017 1012   ALT 50 (H) 09/25/2017 1343   BILITOT 1.0 10/29/2017 1012   BILITOT 0.7 09/25/2017 1343       I spent 15 minutes counseling the patient face to face. The total time spent in the appointment was 20 minutes and more than 50% was on counseling.   All questions were answered. The patient knows to call the clinic with any problems, questions or concerns. No barriers to learning was detected.    Heath Lark, MD 10/1/20191:50 PM

## 2017-10-30 NOTE — Assessment & Plan Note (Signed)
This is due to fluid retention I recommend low salt intake and diuretic therapy as needed Hopefully, when I stop her prednisone therapy, her fluid retention will resolve

## 2017-10-30 NOTE — Assessment & Plan Note (Signed)
We discussed the importance of preventive care and reviewed the vaccination programs. She does not have any prior allergic reactions to influenza vaccination. She agrees to proceed with influenza vaccination today and we will administer it today at the clinic.  

## 2017-10-30 NOTE — Assessment & Plan Note (Signed)
She has responded well to Promacta I recommend continue at 25 mg daily along with prednisone 20 mg daily She will return here next week for repeat blood work. If CBC continues to improve or stable, I plan to reduce the dose of prednisone to 10 mg I plan to see her back in 2 weeks for further supportive care.

## 2017-11-05 ENCOUNTER — Inpatient Hospital Stay: Payer: Medicare Other | Attending: Hematology and Oncology

## 2017-11-05 ENCOUNTER — Ambulatory Visit: Payer: Medicare Other

## 2017-11-05 ENCOUNTER — Telehealth: Payer: Self-pay

## 2017-11-05 DIAGNOSIS — R600 Localized edema: Secondary | ICD-10-CM | POA: Insufficient documentation

## 2017-11-05 DIAGNOSIS — Z79899 Other long term (current) drug therapy: Secondary | ICD-10-CM | POA: Diagnosis not present

## 2017-11-05 DIAGNOSIS — D693 Immune thrombocytopenic purpura: Secondary | ICD-10-CM | POA: Insufficient documentation

## 2017-11-05 DIAGNOSIS — E790 Hyperuricemia without signs of inflammatory arthritis and tophaceous disease: Secondary | ICD-10-CM | POA: Diagnosis not present

## 2017-11-05 LAB — COMPREHENSIVE METABOLIC PANEL
ALBUMIN: 3.5 g/dL (ref 3.5–5.0)
ALK PHOS: 31 U/L — AB (ref 38–126)
ALT: 27 U/L (ref 0–44)
ANION GAP: 8 (ref 5–15)
AST: 18 U/L (ref 15–41)
BILIRUBIN TOTAL: 0.7 mg/dL (ref 0.3–1.2)
BUN: 29 mg/dL — AB (ref 8–23)
CO2: 27 mmol/L (ref 22–32)
Calcium: 9.4 mg/dL (ref 8.9–10.3)
Chloride: 105 mmol/L (ref 98–111)
Creatinine, Ser: 0.96 mg/dL (ref 0.44–1.00)
GFR calc Af Amer: >60 mL/min (ref >60)
GFR calc non Af Amer: 54 mL/min — ABNORMAL LOW (ref >60)
GLUCOSE: 115 mg/dL — AB (ref 70–99)
POTASSIUM: 4.1 mmol/L (ref 3.5–5.1)
Sodium: 140 mmol/L (ref 135–145)
Total Protein: 6.4 g/dL — ABNORMAL LOW (ref 6.5–8.1)

## 2017-11-05 LAB — CBC WITH DIFFERENTIAL/PLATELET
BASOS ABS: 0 10*3/uL (ref 0.0–0.1)
BASOS PCT: 1 %
EOS ABS: 0 10*3/uL (ref 0.0–0.5)
Eosinophils Relative: 0 %
HEMATOCRIT: 35.2 % (ref 34.8–46.6)
HEMOGLOBIN: 12 g/dL (ref 11.6–15.9)
Lymphocytes Relative: 8 %
Lymphs Abs: 0.5 10*3/uL — ABNORMAL LOW (ref 0.9–3.3)
MCH: 33.5 pg (ref 25.1–34.0)
MCHC: 34.3 g/dL (ref 31.5–36.0)
MCV: 97.7 fL (ref 79.5–101.0)
MONOS PCT: 1 %
Monocytes Absolute: 0.1 10*3/uL (ref 0.1–0.9)
NEUTROS ABS: 6.5 10*3/uL (ref 1.5–6.5)
Neutrophils Relative %: 90 %
Platelets: 67 10*3/uL — ABNORMAL LOW (ref 145–400)
RBC: 3.6 MIL/uL — ABNORMAL LOW (ref 3.70–5.45)
RDW: 16 % — ABNORMAL HIGH (ref 11.2–14.5)
WBC: 7.1 10*3/uL (ref 3.9–10.3)

## 2017-11-05 LAB — URIC ACID: Uric Acid, Serum: 8.7 mg/dL — ABNORMAL HIGH (ref 2.5–7.1)

## 2017-11-05 NOTE — Telephone Encounter (Signed)
-----   Message from Heath Lark, MD sent at 11/05/2017  1:17 PM EDT ----- Regarding: labs are stable Her platelet count is stable still Reduce prrednisone to 10 mg ----- Message ----- From: Buel Ream, Lab In [place] Sent: 11/05/2017  12:39 PM EDT To: Heath Lark, MD

## 2017-11-05 NOTE — Telephone Encounter (Signed)
Called and given below message. She verbalized understanding. 

## 2017-11-12 ENCOUNTER — Encounter: Payer: Self-pay | Admitting: Hematology and Oncology

## 2017-11-12 ENCOUNTER — Inpatient Hospital Stay: Payer: Medicare Other

## 2017-11-12 ENCOUNTER — Inpatient Hospital Stay (HOSPITAL_BASED_OUTPATIENT_CLINIC_OR_DEPARTMENT_OTHER): Payer: Medicare Other | Admitting: Hematology and Oncology

## 2017-11-12 ENCOUNTER — Telehealth: Payer: Self-pay | Admitting: Hematology and Oncology

## 2017-11-12 ENCOUNTER — Ambulatory Visit: Payer: Medicare Other

## 2017-11-12 DIAGNOSIS — D693 Immune thrombocytopenic purpura: Secondary | ICD-10-CM

## 2017-11-12 DIAGNOSIS — E790 Hyperuricemia without signs of inflammatory arthritis and tophaceous disease: Secondary | ICD-10-CM

## 2017-11-12 DIAGNOSIS — Z79899 Other long term (current) drug therapy: Secondary | ICD-10-CM | POA: Diagnosis not present

## 2017-11-12 DIAGNOSIS — R600 Localized edema: Secondary | ICD-10-CM | POA: Diagnosis not present

## 2017-11-12 LAB — CBC WITH DIFFERENTIAL/PLATELET
ABS IMMATURE GRANULOCYTES: 0.05 10*3/uL (ref 0.00–0.07)
BASOS ABS: 0 10*3/uL (ref 0.0–0.1)
BASOS PCT: 0 %
Eosinophils Absolute: 0 10*3/uL (ref 0.0–0.5)
Eosinophils Relative: 0 %
HCT: 37.9 % (ref 36.0–46.0)
Hemoglobin: 12.7 g/dL (ref 12.0–15.0)
IMMATURE GRANULOCYTES: 1 %
LYMPHS ABS: 1 10*3/uL (ref 0.7–4.0)
Lymphocytes Relative: 18 %
MCH: 33.2 pg (ref 26.0–34.0)
MCHC: 33.5 g/dL (ref 30.0–36.0)
MCV: 99.2 fL (ref 80.0–100.0)
Monocytes Absolute: 0.1 10*3/uL (ref 0.1–1.0)
Monocytes Relative: 2 %
NEUTROS ABS: 4.6 10*3/uL (ref 1.7–7.7)
NEUTROS PCT: 79 %
PLATELETS: 78 10*3/uL — AB (ref 150–400)
RBC: 3.82 MIL/uL — ABNORMAL LOW (ref 3.87–5.11)
RDW: 16.8 % — ABNORMAL HIGH (ref 11.5–15.5)
WBC: 5.8 10*3/uL (ref 4.0–10.5)
nRBC: 0 % (ref 0.0–0.2)

## 2017-11-12 LAB — COMPREHENSIVE METABOLIC PANEL
ALBUMIN: 3.8 g/dL (ref 3.5–5.0)
ALT: 27 U/L (ref 0–44)
AST: 17 U/L (ref 15–41)
Alkaline Phosphatase: 28 U/L — ABNORMAL LOW (ref 38–126)
Anion gap: 7 (ref 5–15)
BILIRUBIN TOTAL: 1.2 mg/dL (ref 0.3–1.2)
BUN: 29 mg/dL — AB (ref 8–23)
CO2: 30 mmol/L (ref 22–32)
Calcium: 9.3 mg/dL (ref 8.9–10.3)
Chloride: 105 mmol/L (ref 98–111)
Creatinine, Ser: 1.16 mg/dL — ABNORMAL HIGH (ref 0.44–1.00)
GFR calc Af Amer: 49 mL/min — ABNORMAL LOW (ref >60)
GFR calc non Af Amer: 43 mL/min — ABNORMAL LOW (ref >60)
GLUCOSE: 102 mg/dL — AB (ref 70–99)
POTASSIUM: 3.9 mmol/L (ref 3.5–5.1)
Sodium: 142 mmol/L (ref 135–145)
TOTAL PROTEIN: 6.8 g/dL (ref 6.5–8.1)

## 2017-11-12 LAB — URIC ACID: Uric Acid, Serum: 9.2 mg/dL — ABNORMAL HIGH (ref 2.5–7.1)

## 2017-11-12 MED ORDER — LISINOPRIL 20 MG PO TABS: 20.0000 mg | ORAL_TABLET | Freq: Every day | ORAL | 1 refills | Status: DC

## 2017-11-12 MED ORDER — PREDNISONE 10 MG PO TABS: 10.0000 mg | ORAL_TABLET | Freq: Every day | ORAL | 1 refills | Status: DC

## 2017-11-12 NOTE — Assessment & Plan Note (Signed)
She has persistent hyperuricemia which I suspect could be related to combination blood pressure pill that contain hydrochlorothiazide I recommend discontinuation of Zestoretic and switch her to lisinopril 20 mg only We will continue to check a uric acid level and plan for rasburicase in the next visit to avoid gout

## 2017-11-12 NOTE — Progress Notes (Signed)
[facility] OFF[facility] PROGRESS NOTE  [name], [name], MD  ASSESSMENT & PLAN:  Chronic ITP (idiopathic thrombocytopenia) (HCC) She has responded well to Promacta I recommend continue at 25 mg daily along with prednisone taper She is experiencing some side effects of prednisone withdrawal with reduced energy and appetite I recommend tapering dexamethasone to 10 mg alternate with 5 mg every other day and to continue on Promacta at 25 mg daily She will return here for appointment in 2 weeks for further follow-up The goal is to keep platelet count above 50,000  Hyperuricemia She has persistent hyperuricemia which I suspect could be related to combination blood pressure pill that contain hydrochlorothiazide I recommend discontinuation of Zestoretic and switch her to lisinopril 20 mg only We will continue to check a uric acid level and plan for rasburicase in the next visit to avoid gout  Bilateral leg edema This has improved with recent prednisone taper We will continue to monitor closely and reduce salt intake if possible   No orders of the defined types were placed in this encounter.   INTERVAL HISTORY: Claudia Moore 84 y.o. female returns for further follow-up for ITP She is doing well She has experienced some side effects of prednisone withdrawal with reduced energy and appetite Leg swelling is stable Denies recent infection, fever or chills She appears emotional today The patient denies any recent signs or symptoms of bleeding such as spontaneous epistaxis, hematuria or hematochezia.  SUMMARY OF HEMATOLOGIC HISTORY:  She was last seen in 2017, after referral for abnormal CBC Her total white blood cell count usually range slightly higher than upper limits of normal, between 11.5-12.9  With associated mild thrombocytopenia. Differential confirm mild neutrophilia She was recommend observation only She was subsequently referred back to me in August 2019 due to  progressive thrombocytopenia. CT scan showed no evidence of lymphoma.  She has responded well to low-dose prednisone therapy that was prescribed to treat her hives On 10/03/2017, she received 1 dose of IVIG with no response to therapy.  She also developed serum sickness after that. On October 25, 2017, she started on Promacta 25 mg daily along with prednisone taper On November 12, 2017, prednisone dose is reduced to 10 mg alternate with 5 mg every other day along with Promacta 25 mg daily  I have reviewed the past medical history, past surgical history, social history and family history with the patient and they are unchanged from previous note.  ALLERGIES:  is allergic to pine; celebrex [celecoxib]; and zyrtec [cetirizine].  MEDICATIONS:  Current Outpatient Medications  Medication Sig Dispense Refill  . atenolol (TENORMIN) 25 MG tablet Take 12.5 mg by mouth daily.  0  . Cholecalciferol (VITAMIN D PO) Take 2,000 Units by mouth.    . eltrombopag (PROMACTA) 25 MG tablet Take 1 tablet (25 mg total) by mouth daily. Take on an empty stomach, 1 hour before a meal or 2 hours after. 30 tablet 9  . escitalopram (LEXAPRO) 10 MG tablet Take 10 mg by mouth daily.  3  . lisinopril (PRINIVIL,ZESTRIL) 20 MG tablet Take 1 tablet (20 mg total) by mouth daily. 30 tablet 1  . lisinopril-hydrochlorothiazide (PRINZIDE,ZESTORETIC) 20-25 MG tablet Take 1 tablet by mouth daily.  0  . Multiple Vitamins-Minerals (MULTIVITAMIN WITH MINERALS) tablet Take 1 tablet by mouth at bedtime.    . predniSONE (DELTASONE) 10 MG tablet Take 1 tablet (10 mg total) by mouth daily with breakfast. 60 tablet 1   No current facility-administered medications for  this visit.      REVIEW OF SYSTEMS:   Constitutional: Denies fevers, chills or night sweats Eyes: Denies blurriness of vision Ears, nose, mouth, throat, and face: Denies mucositis or sore throat Respiratory: Denies cough, dyspnea or wheezes Cardiovascular: Denies palpitation,  chest discomfort  Gastrointestinal:  Denies nausea, heartburn or change in bowel habits Skin: Denies abnormal skin rashes Lymphatics: Denies new lymphadenopathy or easy bruising Neurological:Denies numbness, tingling or new weaknesses All other systems were reviewed with the patient and are negative.  PHYSICAL EXAMINATION: ECOG PERFORMANCE STATUS: 1 - Symptomatic but completely ambulatory  Vitals:   11/12/17 1045  BP: (!) 119/50  Pulse: (!) 58  Resp: 18  Temp: 98.4 F (36.9 C)  SpO2: 100%   There were no vitals filed for this visit.  GENERAL:alert, no distress and comfortable SKIN: skin color, texture, turgor are normal, no rashes or significant lesions NEURO: alert & oriented x 3 with fluent speech, no focal motor/sensory deficits  LABORATORY DATA:  I have reviewed the data as listed     Component Value Date/Time   NA 142 11/12/2017 0959   K 3.9 11/12/2017 0959   CL 105 11/12/2017 0959   CO2 30 11/12/2017 0959   GLUCOSE 102 (H) 11/12/2017 0959   BUN 29 (H) 11/12/2017 0959   CREATININE 1.16 (H) 11/12/2017 0959   CREATININE 1.22 (H) 09/25/2017 1343   CALCIUM 9.3 11/12/2017 0959   PROT 6.8 11/12/2017 0959   ALBUMIN 3.8 11/12/2017 0959   AST 17 11/12/2017 0959   AST 31 09/25/2017 1343   ALT 27 11/12/2017 0959   ALT 50 (H) 09/25/2017 1343   ALKPHOS 28 (L) 11/12/2017 0959   BILITOT 1.2 11/12/2017 0959   BILITOT 0.7 09/25/2017 1343   GFRNONAA 43 (L) 11/12/2017 0959   GFRNONAA 40 (L) 09/25/2017 1343   GFRAA 49 (L) 11/12/2017 0959   GFRAA 46 (L) 09/25/2017 1343    No results found for: SPEP, UPEP  Lab Results  Component Value Date   WBC 5.8 11/12/2017   NEUTROABS 4.6 11/12/2017   HGB 12.7 11/12/2017   HCT 37.9 11/12/2017   MCV 99.2 11/12/2017   PLT 78 (L) 11/12/2017      Chemistry      Component Value Date/Time   NA 142 11/12/2017 0959   K 3.9 11/12/2017 0959   CL 105 11/12/2017 0959   CO2 30 11/12/2017 0959   BUN 29 (H) 11/12/2017 0959   CREATININE  1.16 (H) 11/12/2017 0959   CREATININE 1.22 (H) 09/25/2017 1343      Component Value Date/Time   CALCIUM 9.3 11/12/2017 0959   ALKPHOS 28 (L) 11/12/2017 0959   AST 17 11/12/2017 0959   AST 31 09/25/2017 1343   ALT 27 11/12/2017 0959   ALT 50 (H) 09/25/2017 1343   BILITOT 1.2 11/12/2017 0959   BILITOT 0.7 09/25/2017 1343       I spent 15 minutes counseling the patient face to face. The total time spent in the appointment was 20 minutes and more than 50% was on counseling.   All questions were answered. The patient knows to call the clinic with any problems, questions or concerns. No barriers to learning was detected.    Heath Lark, MD 10/14/201912:18 PM

## 2017-11-12 NOTE — Assessment & Plan Note (Signed)
She has responded well to Promacta I recommend continue at 25 mg daily along with prednisone taper She is experiencing some side effects of prednisone withdrawal with reduced energy and appetite I recommend tapering dexamethasone to 10 mg alternate with 5 mg every other day and to continue on Promacta at 25 mg daily She will return here for appointment in 2 weeks for further follow-up The goal is to keep platelet count above 50,000

## 2017-11-12 NOTE — Telephone Encounter (Signed)
Gave avs and calendar ° °

## 2017-11-12 NOTE — Assessment & Plan Note (Signed)
This has improved with recent prednisone taper We will continue to monitor closely and reduce salt intake if possible

## 2017-11-14 MED FILL — PROMACTA 25 MG TABLET: 25 | 30 days supply | Qty: 30 | Fill #1

## 2017-11-21 ENCOUNTER — Telehealth: Payer: Self-pay | Admitting: *Deleted

## 2017-11-21 ENCOUNTER — Other Ambulatory Visit: Payer: Self-pay | Admitting: Hematology and Oncology

## 2017-11-21 NOTE — Telephone Encounter (Signed)
-----   Message from Heath Lark, MD sent at 11/21/2017  7:04 AM EDT ----- Regarding: request for refill of prednisone Can you call patient/husband to verify if she really need prednisone refill? Electronic request came in I am tapering her off prednisone over next 2 weeks If she indeed is running out, I would prefer to refill the 5 mg tabs

## 2017-11-21 NOTE — Telephone Encounter (Signed)
Call patient first, see staff msg

## 2017-11-21 NOTE — Telephone Encounter (Signed)
Spoke with Claudia Moore, states she has enough pills for taper. No refill needed

## 2017-11-26 ENCOUNTER — Inpatient Hospital Stay (HOSPITAL_BASED_OUTPATIENT_CLINIC_OR_DEPARTMENT_OTHER): Payer: Medicare Other | Admitting: Hematology and Oncology

## 2017-11-26 ENCOUNTER — Encounter: Payer: Self-pay | Admitting: Hematology and Oncology

## 2017-11-26 ENCOUNTER — Inpatient Hospital Stay: Payer: Medicare Other | Admitting: Medical

## 2017-11-26 ENCOUNTER — Telehealth: Payer: Self-pay | Admitting: Hematology and Oncology

## 2017-11-26 ENCOUNTER — Inpatient Hospital Stay: Payer: Medicare Other

## 2017-11-26 VITALS — BP 152/66 | HR 66 | Temp 97.8°F | Resp 20

## 2017-11-26 DIAGNOSIS — D693 Immune thrombocytopenic purpura: Secondary | ICD-10-CM

## 2017-11-26 DIAGNOSIS — T8090XA Unspecified complication following infusion and therapeutic injection, initial encounter: Secondary | ICD-10-CM

## 2017-11-26 DIAGNOSIS — R600 Localized edema: Secondary | ICD-10-CM | POA: Diagnosis not present

## 2017-11-26 DIAGNOSIS — E790 Hyperuricemia without signs of inflammatory arthritis and tophaceous disease: Secondary | ICD-10-CM

## 2017-11-26 DIAGNOSIS — Z79899 Other long term (current) drug therapy: Secondary | ICD-10-CM | POA: Diagnosis not present

## 2017-11-26 LAB — COMPREHENSIVE METABOLIC PANEL
ALBUMIN: 3.5 g/dL (ref 3.5–5.0)
ALT: 18 U/L (ref 0–44)
AST: 14 U/L — AB (ref 15–41)
Alkaline Phosphatase: 37 U/L — ABNORMAL LOW (ref 38–126)
Anion gap: 10 (ref 5–15)
BILIRUBIN TOTAL: 0.7 mg/dL (ref 0.3–1.2)
BUN: 17 mg/dL (ref 8–23)
CO2: 22 mmol/L (ref 22–32)
CREATININE: 0.89 mg/dL (ref 0.44–1.00)
Calcium: 9.3 mg/dL (ref 8.9–10.3)
Chloride: 110 mmol/L (ref 98–111)
GFR calc Af Amer: >60 mL/min (ref >60)
GFR calc non Af Amer: 59 mL/min — ABNORMAL LOW (ref >60)
GLUCOSE: 99 mg/dL (ref 70–99)
POTASSIUM: 3.8 mmol/L (ref 3.5–5.1)
Sodium: 142 mmol/L (ref 135–145)
TOTAL PROTEIN: 6.3 g/dL — AB (ref 6.5–8.1)

## 2017-11-26 LAB — CBC WITH DIFFERENTIAL/PLATELET
Abs Immature Granulocytes: 0.11 10*3/uL — ABNORMAL HIGH (ref 0.00–0.07)
BASOS ABS: 0 10*3/uL (ref 0.0–0.1)
Basophils Relative: 0 %
EOS PCT: 1 %
Eosinophils Absolute: 0 10*3/uL (ref 0.0–0.5)
HCT: 37 % (ref 36.0–46.0)
HEMOGLOBIN: 12.1 g/dL (ref 12.0–15.0)
Immature Granulocytes: 2 %
LYMPHS PCT: 18 %
Lymphs Abs: 1.1 10*3/uL (ref 0.7–4.0)
MCH: 32.9 pg (ref 26.0–34.0)
MCHC: 32.7 g/dL (ref 30.0–36.0)
MCV: 100.5 fL — ABNORMAL HIGH (ref 80.0–100.0)
Monocytes Absolute: 0.2 10*3/uL (ref 0.1–1.0)
Monocytes Relative: 3 %
NEUTROS PCT: 76 %
NRBC: 0.3 % — AB (ref 0.0–0.2)
Neutro Abs: 4.7 10*3/uL (ref 1.7–7.7)
Platelets: 88 10*3/uL — ABNORMAL LOW (ref 150–400)
RBC: 3.68 MIL/uL — AB (ref 3.87–5.11)
RDW: 15.9 % — AB (ref 11.5–15.5)
WBC: 6.1 10*3/uL (ref 4.0–10.5)

## 2017-11-26 LAB — URIC ACID: Uric Acid, Serum: 7.8 mg/dL — ABNORMAL HIGH (ref 2.5–7.1)

## 2017-11-26 MED ORDER — SODIUM CHLORIDE 0.9 % IV SOLN
3.0000 mg | Freq: Once | INTRAVENOUS | Status: AC
  Administered 2017-11-26: 3 mg via INTRAVENOUS
  Filled 2017-11-26: qty 2

## 2017-11-26 MED ORDER — LORAZEPAM 2 MG/ML IJ SOLN
0.5000 mg | Freq: Once | INTRAMUSCULAR | Status: AC
  Administered 2017-11-26: 0.5 mg via INTRAVENOUS

## 2017-11-26 MED ORDER — LORAZEPAM 2 MG/ML IJ SOLN
INTRAMUSCULAR | Status: AC
  Filled 2017-11-26: qty 1

## 2017-11-26 MED ORDER — SODIUM CHLORIDE 0.9 % IV SOLN
Freq: Once | INTRAVENOUS | Status: AC
  Administered 2017-11-26: 09:00:00 via INTRAVENOUS
  Filled 2017-11-26: qty 250

## 2017-11-26 NOTE — Assessment & Plan Note (Signed)
She has persistent hyperuricemia which I suspect could be related to combination blood pressure pill that contain hydrochlorothiazide I recommend discontinuation of Zestoretic and switch her to lisinopril 20 mg only Repeat uric acid level is still high I recommend 1 dose of rasburicase today

## 2017-11-26 NOTE — Progress Notes (Signed)
Pt complaint of tightness in muscles in lower back and neck. VSS. Sandi Mealy, PA called to bedside to assess pt.   Per Sandi Mealy, PA, 0.5 of Ativan given, ok to continue with treatment.    Pt completed treatment without any further issues. Pt denies any muscle tightness in her back or neck. Pt states, I feel more relaxed.  Pt discharged with husband.

## 2017-11-26 NOTE — Patient Instructions (Signed)
Rasburicase Injection (Elitek) What is this medicine? RASBURICASE (ras BURE i kase) breaks down uric acid in the blood. It is used to prevent and to treat high levels of uric acid caused by cancer treatment. This medicine may be used for other purposes; ask your health care provider or pharmacist if you have questions. COMMON BRAND NAME(S): Elitek What should I tell my health care provider before I take this medicine? They need to know if you have any of these conditions: -G6PD deficiency -history of anemia -history of blood transfusion -an unusual or allergic reaction to rasburicase, yeast, mannitol, other medicines, foods, dyes, or preservatives -pregnant or trying to get pregnant -breast-feeding How should I use this medicine? This medicine is for infusion into a vein. It is given by a health care professional in a hospital or clinic setting. Talk to your pediatrician regarding the use of this medicine in children. While this drug may be prescribed for children as young as 49 month old for selected conditions, precautions do apply. Overdosage: If you think you have taken too much of this medicine contact a poison control center or emergency room at once. NOTE: This medicine is only for you. Do not share this medicine with others. What if I miss a dose? This does not apply. What may interact with this medicine? Interactions have not been studied. Give your health care provider a list of all the medicines, herbs, non-prescription drugs, or dietary supplements you use. Also tell them if you smoke, drink alcohol, or use illegal drugs. Some items may interact with your medicine. This list may not describe all possible interactions. Give your health care provider a list of all the medicines, herbs, non-prescription drugs, or dietary supplements you use. Also tell them if you smoke, drink alcohol, or use illegal drugs. Some items may interact with your medicine. What should I watch for while using  this medicine? Your condition will be monitored carefully while you are receiving this medicine. You will need to have regular blood tests during your treatment. What side effects may I notice from receiving this medicine? Side effects that you should report to your doctor or health care professional as soon as possible: -allergic reactions like skin rash, itching or hives, swelling of the face, lips, or tongue -blue color to lips or nailbeds -breathing problems -chest pain, tightness -confusion -fast, irregular heartbeat -feeling faint or lightheaded, falls -low blood pressure -seizures -unusually weak or tired -yellowing of the eyes or skin Side effects that usually do not require medical attention (report to your doctor or health care professional if they continue or are bothersome): -abdominal pain -constipation -diarrhea -fever -headache -nausea, vomiting -swelling of the ankles, feet, hands This list may not describe all possible side effects. Call your doctor for medical advice about side effects. You may report side effects to FDA at 1-800-FDA-1088. Where should I keep my medicine? This drug is given in a hospital or clinic and will not be stored at home. NOTE: This sheet is a summary. It may not cover all possible information. If you have questions about this medicine, talk to your doctor, pharmacist, or health care provider.  2018 Elsevier/Gold Standard (2013-07-11 13:24:23)

## 2017-11-26 NOTE — Assessment & Plan Note (Signed)
She has responded well to Promacta I recommend continue at 25 mg daily along with prednisone taper I recommend tapering prednisone to 5 mg daily for 3 days then alternate every other day and to continue on Promacta at 25 mg daily She will discontinue her last dose of prednisone on December 02, 2017 She will return here for appointment for further follow-up The goal is to keep platelet count above 50,000

## 2017-11-26 NOTE — Progress Notes (Signed)
[facility] OFF[facility] PROGRESS NOTE  [name], [name], MD  ASSESSMENT & PLAN:  Chronic ITP (idiopathic thrombocytopenia) ([facility]) She has responded well to Promacta I recommend continue at 25 mg daily along with prednisone taper I recommend tapering prednisone to 5 mg daily for 3 days then alternate every other day and to continue on Promacta at 25 mg daily She will discontinue her last dose of prednisone on December 02, 2017 She will return here for appointment for further follow-up The goal is to keep platelet count above 50,000  Hyperuricemia She has persistent hyperuricemia which I suspect could be related to combination blood pressure pill that contain hydrochlorothiazide I recommend discontinuation of Zestoretic and switch her to lisinopril 20 mg only Repeat uric acid level is still high I recommend 1 dose of rasburicase today  Bilateral leg edema She has significant weight gain and fluid retention due to discontinuation of diuretic therapy The patient would like to try dietary management If her leg swelling does not improve, she might need to be started back on diuretics   No orders of the defined types were placed in this encounter.   INTERVAL HISTORY: Claudia Moore 84 y.o. female returns for further follow-up Since last time I saw her, she felt well She has gained some weight with mild worsening fluid retention but she has not watch her diet closely She is tolerating prednisone taper well No recent infection, fever or chills The patient denies any recent signs or symptoms of bleeding such as spontaneous epistaxis, hematuria or hematochezia.  SUMMARY OF HEMATOLOGIC HISTORY:  She was last seen in 2017, after referral for abnormal CBC Her total white blood cell count usually range slightly higher than upper limits of normal, between 11.5-12.9  With associated mild thrombocytopenia. Differential confirm mild neutrophilia She was recommend observation only She was  subsequently referred back to me in August 2019 due to progressive thrombocytopenia. CT scan showed no evidence of lymphoma.  She has responded well to low-dose prednisone therapy that was prescribed to treat her hives On 10/03/2017, she received 1 dose of IVIG with no response to therapy.  She also developed serum sickness after that. On October 25, 2017, she started on Promacta 25 mg daily along with prednisone taper On November 12, 2017, prednisone dose is reduced to 10 mg alternate with 5 mg every other day along with Promacta 25 mg daily On November 26, 2017, prednisone dose is reduced to 5 mg alternate with 0 mg every other day along with Promacta 25 mg daily  I have reviewed the past medical history, past surgical history, social history and family history with the patient and they are unchanged from previous note.  ALLERGIES:  is allergic to pine; celebrex [celecoxib]; and zyrtec [cetirizine].  MEDICATIONS:  Current Outpatient Medications  Medication Sig Dispense Refill  . atenolol (TENORMIN) 25 MG tablet Take 12.5 mg by mouth daily.  0  . Cholecalciferol (VITAMIN D PO) Take 2,000 Units by mouth.    . eltrombopag (PROMACTA) 25 MG tablet Take 1 tablet (25 mg total) by mouth daily. Take on an empty stomach, 1 hour before a meal or 2 hours after. 30 tablet 9  . escitalopram (LEXAPRO) 10 MG tablet Take 10 mg by mouth daily.  3  . lisinopril (PRINIVIL,ZESTRIL) 20 MG tablet Take 1 tablet (20 mg total) by mouth daily. 30 tablet 1  . Multiple Vitamins-Minerals (MULTIVITAMIN WITH MINERALS) tablet Take 1 tablet by mouth at bedtime.    . predniSONE (DELTASONE)  10 MG tablet Take 1 tablet (10 mg total) by mouth daily with breakfast. 60 tablet 1   No current facility-administered medications for this visit.      REVIEW OF SYSTEMS:   Constitutional: Denies fevers, chills or night sweats Eyes: Denies blurriness of vision Ears, nose, mouth, throat, and face: Denies mucositis or sore  throat Respiratory: Denies cough, dyspnea or wheezes Cardiovascular: Denies palpitation, chest discomfort  Gastrointestinal:  Denies nausea, heartburn or change in bowel habits Skin: Denies abnormal skin rashes Lymphatics: Denies new lymphadenopathy or easy bruising Neurological:Denies numbness, tingling or new weaknesses Behavioral/Psych: Mood is stable, no new changes  All other systems were reviewed with the patient and are negative.  PHYSICAL EXAMINATION: ECOG PERFORMANCE STATUS: 1 - Symptomatic but completely ambulatory  Vitals:   11/26/17 0816  BP: (!) 118/53  Pulse: 64  Resp: 18  Temp: 98 F (36.7 C)  SpO2: 98%   Filed Weights   11/26/17 0816  Weight: 185 lb 6.4 oz (84.1 kg)    GENERAL:alert, no distress and comfortable SKIN: skin color, texture, turgor are normal, no rashes or significant lesions HEART: regular rate & rhythm and no murmurs with bilateral lower extremity edema NEURO: alert & oriented x 3 with fluent speech, no focal motor/sensory deficits  LABORATORY DATA:  I have reviewed the data as listed     Component Value Date/Time   NA 142 11/26/2017 0757   K 3.8 11/26/2017 0757   CL 110 11/26/2017 0757   CO2 22 11/26/2017 0757   GLUCOSE 99 11/26/2017 0757   BUN 17 11/26/2017 0757   CREATININE 0.89 11/26/2017 0757   CREATININE 1.22 (H) 09/25/2017 1343   CALCIUM 9.3 11/26/2017 0757   PROT 6.3 (L) 11/26/2017 0757   ALBUMIN 3.5 11/26/2017 0757   AST 14 (L) 11/26/2017 0757   AST 31 09/25/2017 1343   ALT 18 11/26/2017 0757   ALT 50 (H) 09/25/2017 1343   ALKPHOS 37 (L) 11/26/2017 0757   BILITOT 0.7 11/26/2017 0757   BILITOT 0.7 09/25/2017 1343   GFRNONAA 59 (L) 11/26/2017 0757   GFRNONAA 40 (L) 09/25/2017 1343   GFRAA >60 11/26/2017 0757   GFRAA 46 (L) 09/25/2017 1343    No results found for: SPEP, UPEP  Lab Results  Component Value Date   WBC 6.1 11/26/2017   NEUTROABS 4.7 11/26/2017   HGB 12.1 11/26/2017   HCT 37.0 11/26/2017   MCV 100.5  (H) 11/26/2017   PLT 88 (L) 11/26/2017      Chemistry      Component Value Date/Time   NA 142 11/26/2017 0757   K 3.8 11/26/2017 0757   CL 110 11/26/2017 0757   CO2 22 11/26/2017 0757   BUN 17 11/26/2017 0757   CREATININE 0.89 11/26/2017 0757   CREATININE 1.22 (H) 09/25/2017 1343      Component Value Date/Time   CALCIUM 9.3 11/26/2017 0757   ALKPHOS 37 (L) 11/26/2017 0757   AST 14 (L) 11/26/2017 0757   AST 31 09/25/2017 1343   ALT 18 11/26/2017 0757   ALT 50 (H) 09/25/2017 1343   BILITOT 0.7 11/26/2017 0757   BILITOT 0.7 09/25/2017 1343      I spent 15 minutes counseling the patient face to face. The total time spent in the appointment was 20 minutes and more than 50% was on counseling.   All questions were answered. The patient knows to call the clinic with any problems, questions or concerns. No barriers to learning was detected.  Heath Lark, MD 10/28/201911:50 AM

## 2017-11-26 NOTE — Telephone Encounter (Signed)
Gave avs and calendar ° °

## 2017-11-26 NOTE — Assessment & Plan Note (Signed)
She has significant weight gain and fluid retention due to discontinuation of diuretic therapy The patient would like to try dietary management If her leg swelling does not improve, she might need to be started back on diuretics

## 2017-11-27 ENCOUNTER — Telehealth: Payer: Self-pay | Admitting: Medical

## 2017-11-27 NOTE — Telephone Encounter (Signed)
No 10/28 los nor referrals. °

## 2017-11-30 ENCOUNTER — Telehealth: Payer: Self-pay

## 2017-11-30 NOTE — Telephone Encounter (Signed)
Called regarding refill request from [facility] for Prednisone. She has 3 more days and will stop Rx. She does not need a refill.

## 2017-12-02 NOTE — Progress Notes (Signed)
    DATE:  11/26/2017                                          X CHEMO/IMMUNOTHERAPY REACTION             MD: Dr. Heath Lark   AGENT/BLOOD PRODUCT RECEIVING TODAY:               Rasburicase   AGENT/BLOOD PRODUCT RECEIVING IMMEDIATELY PRIOR TO REACTION:           Rasburicase   VS: BP:      118/53   P:        64       SPO2:        98% on room air                BP:      157/69   P:        70       SPO2:        99% on room air     REACTION(S):            Muscle tightness of the chest and abdomen and lower back pain   PREMEDS:      None   INTERVENTION: Ativan 0.5 mg IV x1   Review of Systems  Review of Systems  Constitutional: Negative for chills, diaphoresis and fever.  HENT: Negative for trouble swallowing and voice change.   Respiratory: Negative for cough, chest tightness, shortness of breath and wheezing.   Cardiovascular: Negative for chest pain and palpitations.       Muscle tightness of the chest  Gastrointestinal: Negative for abdominal pain, constipation, diarrhea, nausea and vomiting.       Muscle tightness of the abdomen  Musculoskeletal: Positive for back pain. Negative for myalgias.  Neurological: Negative for dizziness, light-headedness and headaches.     Physical Exam  Physical Exam  Constitutional: No distress.  HENT:  Head: Normocephalic and atraumatic.  Cardiovascular: Normal rate, regular rhythm and normal heart sounds. Exam reveals no gallop and no friction rub.  No murmur heard. Pulmonary/Chest: Effort normal and breath sounds normal. No respiratory distress. She has no wheezes. She has no rales.  Neurological: She is alert.  Skin: Skin is warm and dry. No rash noted. She is not diaphoretic. No erythema.    OUTCOME:                 The patient's symptoms abated.  She was able to complete dosing with rasburicase.    Sandi Mealy, MHS, PA-C

## 2017-12-03 ENCOUNTER — Encounter: Payer: Self-pay | Admitting: Adult Health

## 2017-12-03 ENCOUNTER — Other Ambulatory Visit: Payer: Medicare Other

## 2017-12-03 ENCOUNTER — Inpatient Hospital Stay: Payer: Medicare Other | Attending: Hematology and Oncology

## 2017-12-03 ENCOUNTER — Inpatient Hospital Stay (HOSPITAL_BASED_OUTPATIENT_CLINIC_OR_DEPARTMENT_OTHER): Payer: Medicare Other | Admitting: Adult Health

## 2017-12-03 ENCOUNTER — Telehealth: Payer: Self-pay | Admitting: Adult Health

## 2017-12-03 VITALS — BP 112/47 | HR 55 | Temp 97.8°F | Resp 18 | Ht 62.0 in | Wt 175.0 lb

## 2017-12-03 DIAGNOSIS — R600 Localized edema: Secondary | ICD-10-CM | POA: Insufficient documentation

## 2017-12-03 DIAGNOSIS — Z79899 Other long term (current) drug therapy: Secondary | ICD-10-CM | POA: Insufficient documentation

## 2017-12-03 DIAGNOSIS — D693 Immune thrombocytopenic purpura: Secondary | ICD-10-CM

## 2017-12-03 DIAGNOSIS — I10 Essential (primary) hypertension: Secondary | ICD-10-CM | POA: Insufficient documentation

## 2017-12-03 LAB — CBC WITH DIFFERENTIAL/PLATELET
ABS IMMATURE GRANULOCYTES: 0.04 10*3/uL (ref 0.00–0.07)
BASOS PCT: 0 %
Basophils Absolute: 0 10*3/uL (ref 0.0–0.1)
Eosinophils Absolute: 0.1 10*3/uL (ref 0.0–0.5)
Eosinophils Relative: 1 %
HCT: 36.3 % (ref 36.0–46.0)
HEMOGLOBIN: 11.8 g/dL — AB (ref 12.0–15.0)
IMMATURE GRANULOCYTES: 1 %
LYMPHS PCT: 16 %
Lymphs Abs: 0.8 10*3/uL (ref 0.7–4.0)
MCH: 32.2 pg (ref 26.0–34.0)
MCHC: 32.5 g/dL (ref 30.0–36.0)
MCV: 99.2 fL (ref 80.0–100.0)
MONOS PCT: 2 %
Monocytes Absolute: 0.1 10*3/uL (ref 0.1–1.0)
NEUTROS ABS: 4 10*3/uL (ref 1.7–7.7)
NEUTROS PCT: 80 %
PLATELETS: 80 10*3/uL — AB (ref 150–400)
RBC: 3.66 MIL/uL — AB (ref 3.87–5.11)
RDW: 15.8 % — ABNORMAL HIGH (ref 11.5–15.5)
WBC: 5 10*3/uL (ref 4.0–10.5)
nRBC: 0 % (ref 0.0–0.2)

## 2017-12-03 LAB — COMPREHENSIVE METABOLIC PANEL
ALBUMIN: 3.6 g/dL (ref 3.5–5.0)
ALT: 15 U/L (ref 0–44)
AST: 14 U/L — ABNORMAL LOW (ref 15–41)
Alkaline Phosphatase: 41 U/L (ref 38–126)
Anion gap: 9 (ref 5–15)
BUN: 14 mg/dL (ref 8–23)
CO2: 24 mmol/L (ref 22–32)
CREATININE: 0.97 mg/dL (ref 0.44–1.00)
Calcium: 9.3 mg/dL (ref 8.9–10.3)
Chloride: 108 mmol/L (ref 98–111)
GFR calc Af Amer: >60 mL/min (ref >60)
GFR calc non Af Amer: 53 mL/min — ABNORMAL LOW (ref >60)
GLUCOSE: 119 mg/dL — AB (ref 70–99)
Potassium: 3.7 mmol/L (ref 3.5–5.1)
SODIUM: 141 mmol/L (ref 135–145)
Total Bilirubin: 1.1 mg/dL (ref 0.3–1.2)
Total Protein: 6.7 g/dL (ref 6.5–8.1)

## 2017-12-03 LAB — URIC ACID: Uric Acid, Serum: 7.9 mg/dL — ABNORMAL HIGH (ref 2.5–7.1)

## 2017-12-03 MED ORDER — HYDROCHLOROTHIAZIDE 12.5 MG PO TABS: 12.5000 mg | ORAL_TABLET | Freq: Every day | ORAL | 0 refills | Status: DC

## 2017-12-03 NOTE — Progress Notes (Signed)
[facility]:    Claudia Amel, MD [address] [address]   DIAGNOSIS: Chronic ITP  SUMMARY OF ONCOLOGIC HISTORY: She was last seen in 2017, after referral for abnormal CBC Her total white blood cell count usually range slightly higher than upper limits of normal, between 11.5-12.9  With associated mild thrombocytopenia. Differential confirm mild neutrophilia She was recommend observation only She was subsequently referred back to me in August 2019 due to progressive thrombocytopenia. CT scan showed no evidence of lymphoma.  She has responded well to low-dose prednisone therapy that was prescribed to treat her hives On 10/03/2017, she received 1 dose of IVIG with no response to therapy.  She also developed serum sickness after that. On October 25, 2017, she started on Promacta 25 mg daily along with prednisone taper On November 12, 2017, prednisone dose is reduced to 10 mg alternate with 5 mg every other day along with Promacta 25 mg daily On November 26, 2017, prednisone dose is reduced to 5 mg alternate with 0 mg every other day along with Promacta 25 mg daily  CURRENT THERAPY: Promacta 25mg  per day  INTERVAL HISTORY: Claudia Moore 84 y.o. female returns for evaluation of her ITP. She continues on Promacta and is tolerating it well.  She stopped the prednisone yesterday.  She is feeling well.  She does note that she does feel more energized over the past 3 days.  She did receive Rasburicase last week due to hyperuricemia.  She noted that during the infusion towards the end, that she experienced bilateral upper arm pain, lower back spasms. She was evaluated by Sandi Mealy, and received lorazepam 0.5mg  IV for it.  She recently had the HCTZ portion of her lisinopril discontinued due to there hyperuricemia.  The uric acid is not improved today.       Patient Active Problem List   Diagnosis Date Noted  . Preventive measure  10/30/2017  . Goals of care, counseling/discussion 10/16/2017  . Serum sickness due to drug 10/10/2017  . Hyperuricemia 09/26/2017  . Chronic ITP (idiopathic thrombocytopenia) (HCC) 09/25/2017  . Hives 09/07/2017  . Gout 09/07/2017  . Bilateral leg edema 09/07/2017  . Thrombocytopenia ([place]) 02/12/2015  . Nonalcoholic fatty liver disease 02/12/2015  . Bile salt-induced diarrhea 02/12/2015  . Keratosis, seborrheic 02/12/2015    is allergic to pine; celebrex [celecoxib]; and zyrtec [cetirizine].  MEDICAL HISTORY: Past Medical History:  Diagnosis Date  . Depression   . Hypertension   . Thyroid disease     SURGICAL HISTORY: Past Surgical History:  Procedure Laterality Date  . ABDOMINAL HYSTERECTOMY     endometriosis   . APPENDECTOMY    . CHOLECYSTECTOMY    . COLONOSCOPY    . TONSILLECTOMY      SOCIAL HISTORY: Social History   Socioeconomic History  . Marital status: Married    Spouse name: Not on file  . Number of children: Not on file  . Years of education: Not on file  . Highest education level: Not on file  Occupational History  . Not on file  Social Needs  . Financial resource strain: Not on file  . Food insecurity:    Worry: Not on file    Inability: Not on file  . Transportation needs:    Medical: Not on file    Non-medical: Not on file  Tobacco Use  . Smoking status: Never Smoker  . Smokeless tobacco: Never Used  Substance and  Sexual Activity  . Alcohol use: No  . Drug use: No  . Sexual activity: Not on file    Comment: 1 biological child and 1 adopted. Married. retired Network engineer work  Lifestyle  . Physical activity:    Days per week: Not on file    Minutes per session: Not on file  . Stress: Not on file  Relationships  . Social connections:    Talks on phone: Not on file    Gets together: Not on file    Attends religious service: Not on file    Active member of club or organization: Not on file    Attends meetings of clubs or organizations:  Not on file    Relationship status: Not on file  . Intimate partner violence:    Fear of current or ex partner: Not on file    Emotionally abused: Not on file    Physically abused: Not on file    Forced sexual activity: Not on file  Other Topics Concern  . Not on file  Social History Narrative  . Not on file    FAMILY HISTORY: Family History  Problem Relation Age of Onset  . Cancer Mother        brain tumor  . Cancer Father        colon cancer    Review of Systems  Constitutional: Negative for appetite change, chills, fatigue, fever and unexpected weight change.  HENT:   Negative for hearing loss.   Eyes: Negative for eye problems and icterus.  Respiratory: Negative for chest tightness, cough and shortness of breath.   Cardiovascular: Positive for leg swelling. Negative for chest pain and palpitations.  Gastrointestinal: Negative for abdominal distention, abdominal pain, constipation, diarrhea, nausea and vomiting.  Endocrine: Negative for hot flashes.  Skin: Negative for itching and rash.  Neurological: Negative for dizziness, extremity weakness, headaches and numbness.  Hematological: Negative for adenopathy. Does not bruise/bleed easily.  Psychiatric/Behavioral: Negative for depression. The patient is not nervous/anxious.       PHYSICAL EXAMINATION  ECOG PERFORMANCE STATUS: 1 - Symptomatic but completely ambulatory  Vitals:   12/03/17 0938  BP: (!) 112/47  Pulse: (!) 55  Resp: 18  Temp: 97.8 F (36.6 C)  SpO2: 98%    Physical Exam  Constitutional: She is oriented to person, place, and time. She appears well-developed and well-nourished.  HENT:  Head: Normocephalic and atraumatic.  Mouth/Throat: Oropharynx is clear and moist. No oropharyngeal exudate.  Eyes: Pupils are equal, round, and reactive to light. No scleral icterus.  Neck: Neck supple.  Cardiovascular: Normal rate, regular rhythm and normal heart sounds.  Pulmonary/Chest: Effort normal and breath  sounds normal.  Abdominal: Soft. Bowel sounds are normal.  Musculoskeletal: She exhibits no edema.  Lymphadenopathy:    She has no cervical adenopathy.  Neurological: She is alert and oriented to person, place, and time.  Skin: Skin is warm and dry. Capillary refill takes less than 2 seconds.  Psychiatric: She has a normal mood and affect.    LABORATORY DATA:  CBC    Component Value Date/Time   WBC 5.0 12/03/2017 0847   RBC 3.66 (L) 12/03/2017 0847   HGB 11.8 (L) 12/03/2017 0847   HCT 36.3 12/03/2017 0847   PLT 80 (L) 12/03/2017 0847   MCV 99.2 12/03/2017 0847   MCH 32.2 12/03/2017 0847   MCHC 32.5 12/03/2017 0847   RDW 15.8 (H) 12/03/2017 0847   LYMPHSABS 0.8 12/03/2017 0847   MONOABS 0.1 12/03/2017 0847  EOSABS 0.1 12/03/2017 0847   BASOSABS 0.0 12/03/2017 0847    CMP     Component Value Date/Time   NA 141 12/03/2017 0847   K 3.7 12/03/2017 0847   CL 108 12/03/2017 0847   CO2 24 12/03/2017 0847   GLUCOSE 119 (H) 12/03/2017 0847   BUN 14 12/03/2017 0847   CREATININE 0.97 12/03/2017 0847   CREATININE 1.22 (H) 09/25/2017 1343   CALCIUM 9.3 12/03/2017 0847   PROT 6.7 12/03/2017 0847   ALBUMIN 3.6 12/03/2017 0847   AST 14 (L) 12/03/2017 0847   AST 31 09/25/2017 1343   ALT 15 12/03/2017 0847   ALT 50 (H) 09/25/2017 1343   ALKPHOS 41 12/03/2017 0847   BILITOT 1.1 12/03/2017 0847   BILITOT 0.7 09/25/2017 1343   GFRNONAA 53 (L) 12/03/2017 0847   GFRNONAA 40 (L) 09/25/2017 1343   GFRAA >60 12/03/2017 0847   GFRAA 46 (L) 09/25/2017 1343          ASSESSMENT and THERAPY PLAN:   Chronic ITP (idiopathic thrombocytopenia) (HCC) Her platelets are stable on Promacta at current dose.  She has stopped the Prednisone taper.  She will return for weekly labs and f/u with Dr. Alvy Bimler on 12/2 as scheduled.    Bilateral leg edema I added back HCTZ, but at a lower dose than what it was previously (12.5mg  instead of 25mg ).  We will closely follow her labs and leg swelling,  along with her weight.      All questions were answered. The patient knows to call the clinic with any problems, questions or concerns. We can certainly see the patient much sooner if necessary.  A total of (30) minutes of face-to-face time was spent with this patient with greater than 50% of that time in counseling and care-coordination.  This note was electronically signed. Scot Dock, NP 12/04/2017

## 2017-12-03 NOTE — Telephone Encounter (Signed)
Printed calendar and avs for patient. °

## 2017-12-04 NOTE — Assessment & Plan Note (Addendum)
I added back HCTZ, but at a lower dose than what it was previously (12.5mg  instead of 25mg ).  We will closely follow her labs and leg swelling, along with her weight.

## 2017-12-04 NOTE — Assessment & Plan Note (Signed)
Her platelets are stable on Promacta at current dose.  She has stopped the Prednisone taper.  She will return for weekly labs and f/u with Dr. Alvy Bimler on 12/2 as scheduled.

## 2017-12-06 ENCOUNTER — Other Ambulatory Visit: Payer: Self-pay | Admitting: Hematology and Oncology

## 2017-12-10 ENCOUNTER — Inpatient Hospital Stay: Payer: Medicare Other

## 2017-12-10 DIAGNOSIS — R600 Localized edema: Secondary | ICD-10-CM | POA: Diagnosis not present

## 2017-12-10 DIAGNOSIS — Z79899 Other long term (current) drug therapy: Secondary | ICD-10-CM | POA: Diagnosis not present

## 2017-12-10 DIAGNOSIS — D693 Immune thrombocytopenic purpura: Secondary | ICD-10-CM | POA: Diagnosis not present

## 2017-12-10 DIAGNOSIS — I10 Essential (primary) hypertension: Secondary | ICD-10-CM | POA: Diagnosis not present

## 2017-12-10 LAB — COMPREHENSIVE METABOLIC PANEL
ALT: 12 U/L (ref 0–44)
ANION GAP: 10 (ref 5–15)
AST: 13 U/L — ABNORMAL LOW (ref 15–41)
Albumin: 3.5 g/dL (ref 3.5–5.0)
Alkaline Phosphatase: 47 U/L (ref 38–126)
BUN: 13 mg/dL (ref 8–23)
CHLORIDE: 105 mmol/L (ref 98–111)
CO2: 25 mmol/L (ref 22–32)
Calcium: 9.6 mg/dL (ref 8.9–10.3)
Creatinine, Ser: 1 mg/dL (ref 0.44–1.00)
GFR calc non Af Amer: 51 mL/min — ABNORMAL LOW (ref >60)
GFR, EST AFRICAN AMERICAN: 59 mL/min — AB (ref >60)
Glucose, Bld: 134 mg/dL — ABNORMAL HIGH (ref 70–99)
POTASSIUM: 3.7 mmol/L (ref 3.5–5.1)
SODIUM: 140 mmol/L (ref 135–145)
Total Bilirubin: 0.9 mg/dL (ref 0.3–1.2)
Total Protein: 6.9 g/dL (ref 6.5–8.1)

## 2017-12-10 LAB — URIC ACID: URIC ACID, SERUM: 8.9 mg/dL — AB (ref 2.5–7.1)

## 2017-12-14 MED FILL — PROMACTA 25 MG TABLET: 25 | 30 days supply | Qty: 30 | Fill #2

## 2017-12-17 ENCOUNTER — Inpatient Hospital Stay: Payer: Medicare Other

## 2017-12-17 DIAGNOSIS — D693 Immune thrombocytopenic purpura: Secondary | ICD-10-CM | POA: Diagnosis not present

## 2017-12-17 DIAGNOSIS — R600 Localized edema: Secondary | ICD-10-CM | POA: Diagnosis not present

## 2017-12-17 DIAGNOSIS — Z79899 Other long term (current) drug therapy: Secondary | ICD-10-CM | POA: Diagnosis not present

## 2017-12-17 DIAGNOSIS — I10 Essential (primary) hypertension: Secondary | ICD-10-CM | POA: Diagnosis not present

## 2017-12-17 LAB — COMPREHENSIVE METABOLIC PANEL
ALBUMIN: 3.3 g/dL — AB (ref 3.5–5.0)
ALK PHOS: 49 U/L (ref 38–126)
ALT: 10 U/L (ref 0–44)
ANION GAP: 9 (ref 5–15)
AST: 14 U/L — ABNORMAL LOW (ref 15–41)
BUN: 14 mg/dL (ref 8–23)
CALCIUM: 9.5 mg/dL (ref 8.9–10.3)
CHLORIDE: 104 mmol/L (ref 98–111)
CO2: 26 mmol/L (ref 22–32)
Creatinine, Ser: 1.02 mg/dL — ABNORMAL HIGH (ref 0.44–1.00)
GFR calc Af Amer: 58 mL/min — ABNORMAL LOW (ref >60)
GFR calc non Af Amer: 50 mL/min — ABNORMAL LOW (ref >60)
GLUCOSE: 117 mg/dL — AB (ref 70–99)
Potassium: 4 mmol/L (ref 3.5–5.1)
SODIUM: 139 mmol/L (ref 135–145)
Total Bilirubin: 0.9 mg/dL (ref 0.3–1.2)
Total Protein: 6.7 g/dL (ref 6.5–8.1)

## 2017-12-17 LAB — URIC ACID: Uric Acid, Serum: 8.3 mg/dL — ABNORMAL HIGH (ref 2.5–7.1)

## 2017-12-21 ENCOUNTER — Other Ambulatory Visit: Payer: Self-pay | Admitting: Adult Health

## 2017-12-21 DIAGNOSIS — D693 Immune thrombocytopenic purpura: Secondary | ICD-10-CM

## 2017-12-24 ENCOUNTER — Inpatient Hospital Stay: Payer: Medicare Other

## 2017-12-24 DIAGNOSIS — D693 Immune thrombocytopenic purpura: Secondary | ICD-10-CM | POA: Diagnosis not present

## 2017-12-24 DIAGNOSIS — Z79899 Other long term (current) drug therapy: Secondary | ICD-10-CM | POA: Diagnosis not present

## 2017-12-24 DIAGNOSIS — R600 Localized edema: Secondary | ICD-10-CM | POA: Diagnosis not present

## 2017-12-24 DIAGNOSIS — I10 Essential (primary) hypertension: Secondary | ICD-10-CM | POA: Diagnosis not present

## 2017-12-24 LAB — CBC WITH DIFFERENTIAL (CANCER CENTER ONLY)
Abs Immature Granulocytes: 0.03 10*3/uL (ref 0.00–0.07)
BASOS PCT: 0 %
Basophils Absolute: 0 10*3/uL (ref 0.0–0.1)
Eosinophils Absolute: 0.1 10*3/uL (ref 0.0–0.5)
Eosinophils Relative: 3 %
HCT: 37.7 % (ref 36.0–46.0)
Hemoglobin: 12.3 g/dL (ref 12.0–15.0)
IMMATURE GRANULOCYTES: 1 %
Lymphocytes Relative: 27 %
Lymphs Abs: 0.9 10*3/uL (ref 0.7–4.0)
MCH: 31.5 pg (ref 26.0–34.0)
MCHC: 32.6 g/dL (ref 30.0–36.0)
MCV: 96.4 fL (ref 80.0–100.0)
Monocytes Absolute: 0.1 10*3/uL (ref 0.1–1.0)
Monocytes Relative: 2 %
NEUTROS ABS: 2.2 10*3/uL (ref 1.7–7.7)
NEUTROS PCT: 67 %
Platelet Count: 78 10*3/uL — ABNORMAL LOW (ref 150–400)
RBC: 3.91 MIL/uL (ref 3.87–5.11)
RDW: 14.1 % (ref 11.5–15.5)
WBC: 3.3 10*3/uL — AB (ref 4.0–10.5)
nRBC: 0.9 % — ABNORMAL HIGH (ref 0.0–0.2)

## 2017-12-24 LAB — CMP (CANCER CENTER ONLY)
ALBUMIN: 3.4 g/dL — AB (ref 3.5–5.0)
ALT: 15 U/L (ref 0–44)
AST: 16 U/L (ref 15–41)
Alkaline Phosphatase: 47 U/L (ref 38–126)
Anion gap: 10 (ref 5–15)
BILIRUBIN TOTAL: 0.7 mg/dL (ref 0.3–1.2)
BUN: 11 mg/dL (ref 8–23)
CO2: 24 mmol/L (ref 22–32)
Calcium: 9 mg/dL (ref 8.9–10.3)
Chloride: 109 mmol/L (ref 98–111)
Creatinine: 0.95 mg/dL (ref 0.44–1.00)
GFR, EST NON AFRICAN AMERICAN: 54 mL/min — AB (ref >60)
GFR, Est AFR Am: >60 mL/min (ref >60)
GLUCOSE: 114 mg/dL — AB (ref 70–99)
POTASSIUM: 3.8 mmol/L (ref 3.5–5.1)
Sodium: 143 mmol/L (ref 135–145)
TOTAL PROTEIN: 6.2 g/dL — AB (ref 6.5–8.1)

## 2017-12-24 LAB — URIC ACID: Uric Acid, Serum: 9 mg/dL — ABNORMAL HIGH (ref 2.5–7.1)

## 2017-12-26 ENCOUNTER — Other Ambulatory Visit: Payer: Self-pay

## 2017-12-26 DIAGNOSIS — R600 Localized edema: Secondary | ICD-10-CM

## 2017-12-26 MED ORDER — HYDROCHLOROTHIAZIDE 12.5 MG PO TABS: 12.5000 mg | ORAL_TABLET | Freq: Every day | ORAL | 1 refills | Status: DC

## 2017-12-31 ENCOUNTER — Other Ambulatory Visit: Payer: Self-pay | Admitting: Hematology and Oncology

## 2017-12-31 DIAGNOSIS — M1A00X0 Idiopathic chronic gout, unspecified site, without tophus (tophi): Secondary | ICD-10-CM

## 2017-12-31 DIAGNOSIS — D693 Immune thrombocytopenic purpura: Secondary | ICD-10-CM

## 2018-01-01 ENCOUNTER — Inpatient Hospital Stay (HOSPITAL_BASED_OUTPATIENT_CLINIC_OR_DEPARTMENT_OTHER): Payer: Medicare Other | Admitting: Hematology and Oncology

## 2018-01-01 ENCOUNTER — Telehealth: Payer: Self-pay | Admitting: Hematology and Oncology

## 2018-01-01 ENCOUNTER — Inpatient Hospital Stay: Payer: Medicare Other | Attending: Hematology and Oncology

## 2018-01-01 VITALS — BP 132/54 | HR 62 | Temp 98.1°F | Resp 17 | Ht 62.0 in | Wt 184.4 lb

## 2018-01-01 DIAGNOSIS — D693 Immune thrombocytopenic purpura: Secondary | ICD-10-CM

## 2018-01-01 DIAGNOSIS — Z79899 Other long term (current) drug therapy: Secondary | ICD-10-CM

## 2018-01-01 DIAGNOSIS — R251 Tremor, unspecified: Secondary | ICD-10-CM | POA: Insufficient documentation

## 2018-01-01 DIAGNOSIS — E559 Vitamin D deficiency, unspecified: Secondary | ICD-10-CM

## 2018-01-01 DIAGNOSIS — M7989 Other specified soft tissue disorders: Secondary | ICD-10-CM | POA: Diagnosis not present

## 2018-01-01 DIAGNOSIS — M109 Gout, unspecified: Secondary | ICD-10-CM

## 2018-01-01 DIAGNOSIS — D72819 Decreased white blood cell count, unspecified: Secondary | ICD-10-CM | POA: Diagnosis not present

## 2018-01-01 DIAGNOSIS — R600 Localized edema: Secondary | ICD-10-CM

## 2018-01-01 DIAGNOSIS — L989 Disorder of the skin and subcutaneous tissue, unspecified: Secondary | ICD-10-CM | POA: Diagnosis not present

## 2018-01-01 DIAGNOSIS — E538 Deficiency of other specified B group vitamins: Secondary | ICD-10-CM | POA: Diagnosis not present

## 2018-01-01 DIAGNOSIS — R5383 Other fatigue: Secondary | ICD-10-CM

## 2018-01-01 DIAGNOSIS — M1A00X0 Idiopathic chronic gout, unspecified site, without tophus (tophi): Secondary | ICD-10-CM

## 2018-01-01 LAB — URIC ACID: URIC ACID, SERUM: 8.2 mg/dL — AB (ref 2.5–7.1)

## 2018-01-01 LAB — CBC WITH DIFFERENTIAL/PLATELET
Abs Immature Granulocytes: 0.03 10*3/uL (ref 0.00–0.07)
BASOS PCT: 0 %
Basophils Absolute: 0 10*3/uL (ref 0.0–0.1)
EOS ABS: 0 10*3/uL (ref 0.0–0.5)
Eosinophils Relative: 1 %
HCT: 39.7 % (ref 36.0–46.0)
Hemoglobin: 12.9 g/dL (ref 12.0–15.0)
Immature Granulocytes: 1 %
Lymphocytes Relative: 20 %
Lymphs Abs: 0.7 10*3/uL (ref 0.7–4.0)
MCH: 31 pg (ref 26.0–34.0)
MCHC: 32.5 g/dL (ref 30.0–36.0)
MCV: 95.4 fL (ref 80.0–100.0)
MONOS PCT: 3 %
Monocytes Absolute: 0.1 10*3/uL (ref 0.1–1.0)
NEUTROS ABS: 2.8 10*3/uL (ref 1.7–7.7)
NEUTROS PCT: 75 %
PLATELETS: 59 10*3/uL — AB (ref 150–400)
RBC: 4.16 MIL/uL (ref 3.87–5.11)
RDW: 15.1 % (ref 11.5–15.5)
WBC: 3.8 10*3/uL — AB (ref 4.0–10.5)
nRBC: 0.5 % — ABNORMAL HIGH (ref 0.0–0.2)

## 2018-01-01 LAB — COMPREHENSIVE METABOLIC PANEL
ALT: 15 U/L (ref 0–44)
ANION GAP: 8 (ref 5–15)
AST: 20 U/L (ref 15–41)
Albumin: 3.6 g/dL (ref 3.5–5.0)
Alkaline Phosphatase: 48 U/L (ref 38–126)
BUN: 14 mg/dL (ref 8–23)
CHLORIDE: 107 mmol/L (ref 98–111)
CO2: 24 mmol/L (ref 22–32)
Calcium: 9.6 mg/dL (ref 8.9–10.3)
Creatinine, Ser: 1 mg/dL (ref 0.44–1.00)
GFR calc non Af Amer: 52 mL/min — ABNORMAL LOW (ref >60)
Glucose, Bld: 104 mg/dL — ABNORMAL HIGH (ref 70–99)
POTASSIUM: 4.1 mmol/L (ref 3.5–5.1)
SODIUM: 139 mmol/L (ref 135–145)
Total Bilirubin: 0.7 mg/dL (ref 0.3–1.2)
Total Protein: 6.7 g/dL (ref 6.5–8.1)

## 2018-01-01 MED ORDER — ELTROMBOPAG OLAMINE 50 MG PO TABS: 50.0000 mg | ORAL_TABLET | Freq: Every day | ORAL | 1 refills | Status: DC

## 2018-01-01 MED ORDER — FUROSEMIDE 40 MG PO TABS: 40.0000 mg | ORAL_TABLET | Freq: Every day | ORAL | 1 refills | Status: DC

## 2018-01-01 NOTE — Telephone Encounter (Signed)
Gave avs and calendar ° °

## 2018-01-02 ENCOUNTER — Encounter: Payer: Self-pay | Admitting: Hematology and Oncology

## 2018-01-02 DIAGNOSIS — R251 Tremor, unspecified: Secondary | ICD-10-CM | POA: Insufficient documentation

## 2018-01-02 NOTE — Progress Notes (Signed)
[facility] OFFICE PROGRESS NOTE  [name], [name], MD  ASSESSMENT & PLAN:  Chronic ITP (idiopathic thrombocytopenia) (HCC) She has significant drop of thrombocytopenia She denies bleeding complication With her platelet count trending closer to 50 mg, I recommend increasing Promacta to 50 mg daily and plan to recheck again next week Due to prolonged exposure to steroids, the patient would be at risk of osteoporosis She has not taken vitamin D supplement for a while and will check her vitamin D level in her next visit  Bilateral leg edema She has worsening leg swelling and has not been taking diuretic therapy as directed I recommend switching her to Lasix and restrict salt intake The goal is to get her weight down to less than 180 pounds  Gout She had history of recurrent gout and hyperuricemia She has infusion reaction to rasburicase I recommend close monitoring only for now  Tremor of both hands She noticed worsening tremors I will order thyroid function test in her next visit   Orders Placed This Encounter  Procedures  . TSH    Standing Status:   Future    Standing Expiration Date:   07/03/2019  . Vitamin B12    Standing Status:   Future    Standing Expiration Date:   07/03/2019  . T4, free    Standing Status:   Future    Standing Expiration Date:   07/03/2019  . VITAMIN D 25 Hydroxy (Vit-D Deficiency, Fractures)    Standing Status:   Future    Standing Expiration Date:   07/03/2019  . Comprehensive metabolic panel    Standing Status:   Standing    Number of Occurrences:   9    Standing Expiration Date:   01/02/2019    INTERVAL HISTORY: Claudia Moore 84 y.o. female returns for further follow-up with her husband She complained of excessive fatigue and tremor She also have excessive weight gain with fluid retention and diffuse edema She was prescribed diuretic therapy but was not taking it as instructed She had infusion reaction to rasburicase She denies  recent infection, fever or chills The patient denies any recent signs or symptoms of bleeding such as spontaneous epistaxis, hematuria or hematochezia.  SUMMARY OF HEMATOLOGIC HISTORY:  She was last seen in 2017, after referral for abnormal CBC Her total white blood cell count usually range slightly higher than upper limits of normal, between 11.5-12.9  With associated mild thrombocytopenia. Differential confirm mild neutrophilia She was recommend observation only She was subsequently referred back to me in August 2019 due to progressive thrombocytopenia. CT scan showed no evidence of lymphoma.  She has responded well to low-dose prednisone therapy that was prescribed to treat her hives On 10/03/2017, she received 1 dose of IVIG with no response to therapy.  She also developed serum sickness after that. On October 25, 2017, she started on Promacta 25 mg daily along with prednisone taper On November 12, 2017, prednisone dose is reduced to 10 mg alternate with 5 mg every other day along with Promacta 25 mg daily On November 26, 2017, prednisone dose is reduced to 5 mg alternate with 0 mg every other day along with Promacta 25 mg daily On January 02, 2018, the dose of Promacta is increased to 50 mg daily  I have reviewed the past medical history, past surgical history, social history and family history with the patient and they are unchanged from previous note.  ALLERGIES:  is allergic to pine; celebrex [celecoxib]; and zyrtec [  cetirizine].  MEDICATIONS:  Current Outpatient Medications  Medication Sig Dispense Refill  . atenolol (TENORMIN) 25 MG tablet Take 12.5 mg by mouth daily.  0  . Cholecalciferol (VITAMIN D PO) Take 2,000 Units by mouth.    . eltrombopag (PROMACTA) 50 MG tablet Take 1 tablet (50 mg total) by mouth daily. Increased dose due to progressive thrombocytopenia 30 tablet 1  . escitalopram (LEXAPRO) 10 MG tablet Take 10 mg by mouth daily.  3  . furosemide (LASIX) 40 MG tablet  Take 1 tablet (40 mg total) by mouth daily. 30 tablet 1  . lisinopril (PRINIVIL,ZESTRIL) 20 MG tablet TAKE 1 TABLET BY MOUTH EVERY DAY 30 tablet 1  . Multiple Vitamins-Minerals (MULTIVITAMIN WITH MINERALS) tablet Take 1 tablet by mouth at bedtime.     No current facility-administered medications for this visit.      REVIEW OF SYSTEMS:   Constitutional: Denies fevers, chills or night sweats Eyes: Denies blurriness of vision Ears, nose, mouth, throat, and face: Denies mucositis or sore throat Respiratory: Denies cough, dyspnea or wheezes Cardiovascular: Denies palpitation, chest discomfort Gastrointestinal:  Denies nausea, heartburn or change in bowel habits Skin: Denies abnormal skin rashes Lymphatics: Denies new lymphadenopathy or easy bruising Neurological:Denies numbness, tingling or new weaknesses Behavioral/Psych: Mood is stable, no new changes  All other systems were reviewed with the patient and are negative.  PHYSICAL EXAMINATION: ECOG PERFORMANCE STATUS: 2 - Symptomatic, <50% confined to bed  Vitals:   01/01/18 1057  BP: (!) 132/54  Pulse: 62  Resp: 17  Temp: 98.1 F (36.7 C)  SpO2: 96%   Filed Weights   01/01/18 1057  Weight: 184 lb 6.4 oz (83.6 kg)    GENERAL:alert, no distress and comfortable HEART: Noted significant moderate lower extremity edema Musculoskeletal:no cyanosis of digits and no clubbing  NEURO: alert & oriented x 3 with fluent speech, no focal motor/sensory deficits.  She is noted to be tremulous  LABORATORY DATA:  I have reviewed the data as listed     Component Value Date/Time   NA 139 01/01/2018 1027   K 4.1 01/01/2018 1027   CL 107 01/01/2018 1027   CO2 24 01/01/2018 1027   GLUCOSE 104 (H) 01/01/2018 1027   BUN 14 01/01/2018 1027   CREATININE 1.00 01/01/2018 1027   CREATININE 0.95 12/24/2017 0735   CALCIUM 9.6 01/01/2018 1027   PROT 6.7 01/01/2018 1027   ALBUMIN 3.6 01/01/2018 1027   AST 20 01/01/2018 1027   AST 16 12/24/2017  0735   ALT 15 01/01/2018 1027   ALT 15 12/24/2017 0735   ALKPHOS 48 01/01/2018 1027   BILITOT 0.7 01/01/2018 1027   BILITOT 0.7 12/24/2017 0735   GFRNONAA 52 (L) 01/01/2018 1027   GFRNONAA 54 (L) 12/24/2017 0735   GFRAA >60 01/01/2018 1027   GFRAA >60 12/24/2017 0735    No results found for: SPEP, UPEP  Lab Results  Component Value Date   WBC 3.8 (L) 01/01/2018   NEUTROABS 2.8 01/01/2018   HGB 12.9 01/01/2018   HCT 39.7 01/01/2018   MCV 95.4 01/01/2018   PLT 59 (L) 01/01/2018      Chemistry      Component Value Date/Time   NA 139 01/01/2018 1027   K 4.1 01/01/2018 1027   CL 107 01/01/2018 1027   CO2 24 01/01/2018 1027   BUN 14 01/01/2018 1027   CREATININE 1.00 01/01/2018 1027   CREATININE 0.95 12/24/2017 0735      Component Value Date/Time   CALCIUM  9.6 01/01/2018 1027   ALKPHOS 48 01/01/2018 1027   AST 20 01/01/2018 1027   AST 16 12/24/2017 0735   ALT 15 01/01/2018 1027   ALT 15 12/24/2017 0735   BILITOT 0.7 01/01/2018 1027   BILITOT 0.7 12/24/2017 0735       I spent 15 minutes counseling the patient face to face. The total time spent in the appointment was 20 minutes and more than 50% was on counseling.   All questions were answered. The patient knows to call the clinic with any problems, questions or concerns. No barriers to learning was detected.    Heath Lark, MD 12/4/20197:51 AM

## 2018-01-02 NOTE — Assessment & Plan Note (Signed)
She noticed worsening tremors I will order thyroid function test in her next visit

## 2018-01-02 NOTE — Assessment & Plan Note (Signed)
She has worsening leg swelling and has not been taking diuretic therapy as directed I recommend switching her to Lasix and restrict salt intake The goal is to get her weight down to less than 180 pounds

## 2018-01-02 NOTE — Assessment & Plan Note (Signed)
She had history of recurrent gout and hyperuricemia She has infusion reaction to rasburicase I recommend close monitoring only for now

## 2018-01-02 NOTE — Assessment & Plan Note (Addendum)
She has significant drop of thrombocytopenia She denies bleeding complication With her platelet count trending closer to 50 mg, I recommend increasing Promacta to 50 mg daily and plan to recheck again next week Due to prolonged exposure to steroids, the patient would be at risk of osteoporosis She has not taken vitamin D supplement for a while and will check her vitamin D level in her next visit

## 2018-01-08 ENCOUNTER — Telehealth: Payer: Self-pay

## 2018-01-08 NOTE — Telephone Encounter (Signed)
Called regarding Lisinopril refill request. She does not need refill Lisinopril refill and can wait until appt on 12/12. She will call back later today with a list of the medications later today.

## 2018-01-08 NOTE — Telephone Encounter (Signed)
She called to reviewed home medications. She will bring synthroid medication bottle on Thursday she is not sure of the dose.

## 2018-01-09 MED FILL — PROMACTA 50 MG TABLET: 50 | 30 days supply | Qty: 30 | Fill #0

## 2018-01-10 ENCOUNTER — Encounter: Payer: Self-pay | Admitting: Hematology and Oncology

## 2018-01-10 ENCOUNTER — Inpatient Hospital Stay: Payer: Medicare Other

## 2018-01-10 ENCOUNTER — Telehealth: Payer: Self-pay | Admitting: Hematology and Oncology

## 2018-01-10 ENCOUNTER — Inpatient Hospital Stay (HOSPITAL_BASED_OUTPATIENT_CLINIC_OR_DEPARTMENT_OTHER): Payer: Medicare Other | Admitting: Hematology and Oncology

## 2018-01-10 DIAGNOSIS — R5383 Other fatigue: Secondary | ICD-10-CM

## 2018-01-10 DIAGNOSIS — M7989 Other specified soft tissue disorders: Secondary | ICD-10-CM

## 2018-01-10 DIAGNOSIS — D693 Immune thrombocytopenic purpura: Secondary | ICD-10-CM

## 2018-01-10 DIAGNOSIS — R251 Tremor, unspecified: Secondary | ICD-10-CM | POA: Diagnosis not present

## 2018-01-10 DIAGNOSIS — M109 Gout, unspecified: Secondary | ICD-10-CM | POA: Diagnosis not present

## 2018-01-10 DIAGNOSIS — E538 Deficiency of other specified B group vitamins: Secondary | ICD-10-CM

## 2018-01-10 DIAGNOSIS — D72819 Decreased white blood cell count, unspecified: Secondary | ICD-10-CM | POA: Diagnosis not present

## 2018-01-10 DIAGNOSIS — Z79899 Other long term (current) drug therapy: Secondary | ICD-10-CM | POA: Diagnosis not present

## 2018-01-10 DIAGNOSIS — E559 Vitamin D deficiency, unspecified: Secondary | ICD-10-CM

## 2018-01-10 DIAGNOSIS — M1A00X0 Idiopathic chronic gout, unspecified site, without tophus (tophi): Secondary | ICD-10-CM

## 2018-01-10 DIAGNOSIS — R600 Localized edema: Secondary | ICD-10-CM

## 2018-01-10 LAB — CBC WITH DIFFERENTIAL/PLATELET
Abs Immature Granulocytes: 0.02 K/uL (ref 0.00–0.07)
Basophils Absolute: 0 K/uL (ref 0.0–0.1)
Basophils Relative: 0 %
Eosinophils Absolute: 0 K/uL (ref 0.0–0.5)
Eosinophils Relative: 1 %
HCT: 39.7 % (ref 36.0–46.0)
Hemoglobin: 13 g/dL (ref 12.0–15.0)
Immature Granulocytes: 1 %
Lymphocytes Relative: 18 %
Lymphs Abs: 0.7 K/uL (ref 0.7–4.0)
MCH: 30.9 pg (ref 26.0–34.0)
MCHC: 32.7 g/dL (ref 30.0–36.0)
MCV: 94.3 fL (ref 80.0–100.0)
Monocytes Absolute: 0.1 K/uL (ref 0.1–1.0)
Monocytes Relative: 2 %
Neutro Abs: 2.9 K/uL (ref 1.7–7.7)
Neutrophils Relative %: 78 %
Platelets: 73 K/uL — ABNORMAL LOW (ref 150–400)
RBC: 4.21 MIL/uL (ref 3.87–5.11)
RDW: 15.2 % (ref 11.5–15.5)
WBC: 3.6 K/uL — ABNORMAL LOW (ref 4.0–10.5)
nRBC: 0 % (ref 0.0–0.2)

## 2018-01-10 LAB — COMPREHENSIVE METABOLIC PANEL WITH GFR
ALT: 16 U/L (ref 0–44)
AST: 21 U/L (ref 15–41)
Albumin: 3.9 g/dL (ref 3.5–5.0)
Alkaline Phosphatase: 53 U/L (ref 38–126)
Anion gap: 11 (ref 5–15)
BUN: 16 mg/dL (ref 8–23)
CO2: 24 mmol/L (ref 22–32)
Calcium: 9.5 mg/dL (ref 8.9–10.3)
Chloride: 106 mmol/L (ref 98–111)
Creatinine, Ser: 0.96 mg/dL (ref 0.44–1.00)
GFR calc Af Amer: >60 mL/min
GFR calc non Af Amer: 55 mL/min — ABNORMAL LOW
Glucose, Bld: 105 mg/dL — ABNORMAL HIGH (ref 70–99)
Potassium: 4.1 mmol/L (ref 3.5–5.1)
Sodium: 141 mmol/L (ref 135–145)
Total Bilirubin: 1.1 mg/dL (ref 0.3–1.2)
Total Protein: 7 g/dL (ref 6.5–8.1)

## 2018-01-10 LAB — VITAMIN B12: Vitamin B-12: 277 pg/mL (ref 180–914)

## 2018-01-10 LAB — T4, FREE: Free T4: 1.04 ng/dL (ref 0.82–1.77)

## 2018-01-10 LAB — TSH: TSH: 1.398 u[IU]/mL (ref 0.308–3.960)

## 2018-01-10 NOTE — Telephone Encounter (Signed)
Gave patient avs and calendar.   °

## 2018-01-10 NOTE — Assessment & Plan Note (Signed)
She has chronic leukopenia of unknown etiology Her serum vitamin B12 were at the lower limits of normal I recommend oral vitamin B12 replacement therapy 1000 MCG daily

## 2018-01-10 NOTE — Progress Notes (Signed)
[place] OFFICE PROGRESS NOTE  [name], [name], MD  ASSESSMENT & PLAN:  Chronic ITP (idiopathic thrombocytopenia) (HCC) She has significant improvement of platelet count on 50 mg of Promacta We will continue the same dose without dose adjustment  Bilateral leg edema She has significant fluid retention due to recent prednisone therapy She only took 1 dose of furosemide recently with good success I recommend reducing the dose of furosemide to 20 mg as needed or daily with goal weight of around 178 pounds  Tremor of both hands Her tremor has resolved Recent thyroid function test monitoring were within normal limits.  Observe only  Leukopenia She has chronic leukopenia of unknown etiology Her serum vitamin B12 were at the lower limits of normal I recommend oral vitamin B12 replacement therapy 1000 MCG daily   No orders of the defined types were placed in this encounter.   INTERVAL HISTORY: Claudia Moore 84 y.o. adult returns for further follow-up for chronic ITP Since last time I saw her, she took 1 dose of furosemide with good diuresis She has not taken any furosemide since She continues to have intermittent chronic leg swelling, stable She has excellent energy level since last time I saw her Her tremor has resolved   SUMMARY OF HEMATOLOGIC HISTORY:  She was last seen in 2017, after referral for abnormal CBC Her total white blood cell count usually range slightly higher than upper limits of normal, between 11.5-12.9  With associated mild thrombocytopenia. Differential confirm mild neutrophilia She was recommend observation only She was subsequently referred back to me in August 2019 due to progressive thrombocytopenia. CT scan showed no evidence of lymphoma.  She has responded well to low-dose prednisone therapy that was prescribed to treat her hives On 10/03/2017, she received 1 dose of IVIG with no response to therapy.  She also developed serum sickness after  that. On October 25, 2017, she started on Promacta 25 mg daily along with prednisone taper On November 12, 2017, prednisone dose is reduced to 10 mg alternate with 5 mg every other day along with Promacta 25 mg daily On November 26, 2017, prednisone dose is reduced to 5 mg alternate with 0 mg every other day along with Promacta 25 mg daily On January 02, 2018, the dose of Promacta is increased to 50 mg daily   I have reviewed the past medical history, past surgical history, social history and family history with the patient and they are unchanged from previous note.  ALLERGIES:  is allergic to pine; celebrex [celecoxib]; and zyrtec [cetirizine].  MEDICATIONS:  Current Outpatient Medications  Medication Sig Dispense Refill  . atenolol (TENORMIN) 25 MG tablet Take 12.5 mg by mouth daily.  0  . Cholecalciferol (VITAMIN D PO) Take 2,000 Units by mouth.    . eltrombopag (PROMACTA) 50 MG tablet Take 1 tablet (50 mg total) by mouth daily. Increased dose due to progressive thrombocytopenia 30 tablet 1  . escitalopram (LEXAPRO) 10 MG tablet Take 10 mg by mouth daily.  3  . furosemide (LASIX) 40 MG tablet Take 1 tablet (40 mg total) by mouth daily. 30 tablet 1  . lisinopril (PRINIVIL,ZESTRIL) 20 MG tablet TAKE 1 TABLET BY MOUTH EVERY DAY 30 tablet 1  . Multiple Vitamins-Minerals (MULTIVITAMIN WITH MINERALS) tablet Take 1 tablet by mouth at bedtime.     No current facility-administered medications for this visit.      REVIEW OF SYSTEMS:   Constitutional: Denies fevers, chills or night sweats Eyes: Denies blurriness  of vision Ears, nose, mouth, throat, and face: Denies mucositis or sore throat Respiratory: Denies cough, dyspnea or wheezes Cardiovascular: Denies palpitation, chest discomfort or lower extremity swelling Gastrointestinal:  Denies nausea, heartburn or change in bowel habits Skin: Denies abnormal skin rashes Lymphatics: Denies new lymphadenopathy or easy bruising Neurological:Denies  numbness, tingling or new weaknesses Behavioral/Psych: Mood is stable, no new changes  All other systems were reviewed with the patient and are negative.  PHYSICAL EXAMINATION: ECOG PERFORMANCE STATUS: 1 - Symptomatic but completely ambulatory  Vitals:   01/10/18 0951  BP: (!) 118/47  Pulse: 70  Resp: 18  Temp: 98 F (36.7 C)  SpO2: 98%   Filed Weights   01/10/18 0951  Weight: 180 lb 6.4 oz (81.8 kg)    GENERAL:alert, no distress and comfortable SKIN: skin color, texture, turgor are normal, no rashes or significant lesions HEART: mild lower extremity edema, improved Musculoskeletal:no cyanosis of digits and no clubbing  NEURO: alert & oriented x 3 with fluent speech, no focal motor/sensory deficits  LABORATORY DATA:  I have reviewed the data as listed     Component Value Date/Time   NA 141 01/10/2018 0922   K 4.1 01/10/2018 0922   CL 106 01/10/2018 0922   CO2 24 01/10/2018 0922   GLUCOSE 105 (H) 01/10/2018 0922   BUN 16 01/10/2018 0922   CREATININE 0.96 01/10/2018 0922   CREATININE 0.95 12/24/2017 0735   CALCIUM 9.5 01/10/2018 0922   PROT 7.0 01/10/2018 0922   ALBUMIN 3.9 01/10/2018 0922   AST 21 01/10/2018 0922   AST 16 12/24/2017 0735   ALT 16 01/10/2018 0922   ALT 15 12/24/2017 0735   ALKPHOS 53 01/10/2018 0922   BILITOT 1.1 01/10/2018 0922   BILITOT 0.7 12/24/2017 0735   GFRNONAA 55 (L) 01/10/2018 0922   GFRNONAA 54 (L) 12/24/2017 0735   GFRAA >60 01/10/2018 0922   GFRAA >60 12/24/2017 0735    No results found for: SPEP, UPEP  Lab Results  Component Value Date   WBC 3.6 (L) 01/10/2018   NEUTROABS 2.9 01/10/2018   HGB 13.0 01/10/2018   HCT 39.7 01/10/2018   MCV 94.3 01/10/2018   PLT 73 (L) 01/10/2018      Chemistry      Component Value Date/Time   NA 141 01/10/2018 0922   K 4.1 01/10/2018 0922   CL 106 01/10/2018 0922   CO2 24 01/10/2018 0922   BUN 16 01/10/2018 0922   CREATININE 0.96 01/10/2018 0922   CREATININE 0.95 12/24/2017 0735       Component Value Date/Time   CALCIUM 9.5 01/10/2018 0922   ALKPHOS 53 01/10/2018 0922   AST 21 01/10/2018 0922   AST 16 12/24/2017 0735   ALT 16 01/10/2018 0922   ALT 15 12/24/2017 0735   BILITOT 1.1 01/10/2018 0922   BILITOT 0.7 12/24/2017 0735       I spent 15 minutes counseling the patient face to face. The total time spent in the appointment was 20 minutes and more than 50% was on counseling.   All questions were answered. The patient knows to call the clinic with any problems, questions or concerns. No barriers to learning was detected.    Heath Lark, MD 12/12/20191:53 PM

## 2018-01-10 NOTE — Assessment & Plan Note (Signed)
She has significant fluid retention due to recent prednisone therapy She only took 1 dose of furosemide recently with good success I recommend reducing the dose of furosemide to 20 mg as needed or daily with goal weight of around 178 pounds

## 2018-01-10 NOTE — Assessment & Plan Note (Signed)
She has significant improvement of platelet count on 50 mg of Promacta We will continue the same dose without dose adjustment

## 2018-01-10 NOTE — Assessment & Plan Note (Signed)
Her tremor has resolved Recent thyroid function test monitoring were within normal limits.  Observe only

## 2018-01-11 ENCOUNTER — Other Ambulatory Visit: Payer: Self-pay | Admitting: Hematology and Oncology

## 2018-01-11 LAB — VITAMIN D 25 HYDROXY (VIT D DEFICIENCY, FRACTURES): Vit D, 25-Hydroxy: 42.1 ng/mL (ref 30.0–100.0)

## 2018-01-11 MED ORDER — LISINOPRIL 20 MG PO TABS: 20.0000 mg | ORAL_TABLET | Freq: Every day | ORAL | 1 refills | Status: DC

## 2018-01-23 ENCOUNTER — Other Ambulatory Visit: Payer: Self-pay | Admitting: Hematology and Oncology

## 2018-01-29 ENCOUNTER — Inpatient Hospital Stay (HOSPITAL_BASED_OUTPATIENT_CLINIC_OR_DEPARTMENT_OTHER): Payer: Medicare Other | Admitting: Hematology and Oncology

## 2018-01-29 ENCOUNTER — Encounter: Payer: Self-pay | Admitting: Hematology and Oncology

## 2018-01-29 ENCOUNTER — Inpatient Hospital Stay: Payer: Medicare Other

## 2018-01-29 ENCOUNTER — Telehealth: Payer: Self-pay | Admitting: Hematology and Oncology

## 2018-01-29 DIAGNOSIS — D693 Immune thrombocytopenic purpura: Secondary | ICD-10-CM

## 2018-01-29 DIAGNOSIS — M7989 Other specified soft tissue disorders: Secondary | ICD-10-CM | POA: Diagnosis not present

## 2018-01-29 DIAGNOSIS — D72819 Decreased white blood cell count, unspecified: Secondary | ICD-10-CM | POA: Diagnosis not present

## 2018-01-29 DIAGNOSIS — R251 Tremor, unspecified: Secondary | ICD-10-CM | POA: Diagnosis not present

## 2018-01-29 DIAGNOSIS — R600 Localized edema: Secondary | ICD-10-CM

## 2018-01-29 DIAGNOSIS — M1A00X0 Idiopathic chronic gout, unspecified site, without tophus (tophi): Secondary | ICD-10-CM

## 2018-01-29 DIAGNOSIS — L989 Disorder of the skin and subcutaneous tissue, unspecified: Secondary | ICD-10-CM | POA: Diagnosis not present

## 2018-01-29 DIAGNOSIS — E538 Deficiency of other specified B group vitamins: Secondary | ICD-10-CM

## 2018-01-29 DIAGNOSIS — Z79899 Other long term (current) drug therapy: Secondary | ICD-10-CM | POA: Diagnosis not present

## 2018-01-29 DIAGNOSIS — M109 Gout, unspecified: Secondary | ICD-10-CM | POA: Diagnosis not present

## 2018-01-29 LAB — CBC WITH DIFFERENTIAL/PLATELET
Abs Immature Granulocytes: 0.02 10*3/uL (ref 0.00–0.07)
Basophils Absolute: 0 10*3/uL (ref 0.0–0.1)
Basophils Relative: 1 %
EOS ABS: 0 10*3/uL (ref 0.0–0.5)
Eosinophils Relative: 1 %
HCT: 43.9 % (ref 36.0–46.0)
Hemoglobin: 14.2 g/dL (ref 12.0–15.0)
Immature Granulocytes: 1 %
Lymphocytes Relative: 21 %
Lymphs Abs: 0.7 10*3/uL (ref 0.7–4.0)
MCH: 30.1 pg (ref 26.0–34.0)
MCHC: 32.3 g/dL (ref 30.0–36.0)
MCV: 93 fL (ref 80.0–100.0)
Monocytes Absolute: 0.1 10*3/uL (ref 0.1–1.0)
Monocytes Relative: 3 %
Neutro Abs: 2.4 10*3/uL (ref 1.7–7.7)
Neutrophils Relative %: 73 %
Platelets: 67 10*3/uL — ABNORMAL LOW (ref 150–400)
RBC: 4.72 MIL/uL (ref 3.87–5.11)
RDW: 14.9 % (ref 11.5–15.5)
WBC: 3.2 10*3/uL — ABNORMAL LOW (ref 4.0–10.5)
nRBC: 0 % (ref 0.0–0.2)

## 2018-01-29 LAB — COMPREHENSIVE METABOLIC PANEL
ALK PHOS: 55 U/L (ref 38–126)
ALT: 59 U/L — ABNORMAL HIGH (ref 0–44)
AST: 52 U/L — ABNORMAL HIGH (ref 15–41)
Albumin: 4 g/dL (ref 3.5–5.0)
Anion gap: 12 (ref 5–15)
BUN: 15 mg/dL (ref 8–23)
CALCIUM: 9.7 mg/dL (ref 8.9–10.3)
CO2: 26 mmol/L (ref 22–32)
Chloride: 104 mmol/L (ref 98–111)
Creatinine, Ser: 1.1 mg/dL — ABNORMAL HIGH (ref 0.44–1.00)
GFR calc Af Amer: 54 mL/min — ABNORMAL LOW (ref >60)
GFR calc non Af Amer: 47 mL/min — ABNORMAL LOW (ref >60)
Glucose, Bld: 122 mg/dL — ABNORMAL HIGH (ref 70–99)
Potassium: 4.4 mmol/L (ref 3.5–5.1)
Sodium: 142 mmol/L (ref 135–145)
Total Bilirubin: 0.9 mg/dL (ref 0.3–1.2)
Total Protein: 7.2 g/dL (ref 6.5–8.1)

## 2018-01-29 NOTE — Progress Notes (Signed)
[facility] OFFICE PROGRESS NOTE  [name], [name], MD  ASSESSMENT & PLAN:  Chronic ITP (idiopathic thrombocytopenia) (HCC) She has stable platelet count on 50 mg of Promacta We will continue the same dose without dose adjustment  Bilateral leg edema She still have significant fluid retention on both legs.  Her mobility is poor We discussed dietary modification and increase exercise as tolerated She is also reminded to take elastic compression hose She has a furosemide to take as needed, with goal dry weight around 177 pounds  Leukopenia She has chronic leukopenia of unknown etiology Her serum vitamin B12 were at the lower limits of normal I recommend oral vitamin B12 replacement therapy 1000 MCG daily  Skin lesion She has minor injury to her face from her attempt to squeeze keratoma.  I recommend close follow-up with dermatologist. She also complained of skin itching.  Her liver function tests were within normal limits and prior CT imaging show no evidence of malignancy. I recommend topical emollient cream to avoid dry skin is a contributing factor to the skin itching   No orders of the defined types were placed in this encounter.   INTERVAL HISTORY: Claudia Moore 84 y.o. adult returns for further follow-up. She is accompanied by her husband. She found furosemide helpful to reduce leg swelling. Her mobility is poor due to fatigue. She denies recent infection, fever or chills. She has injured her left cheek due to attempt to squeeze a keratoma.  She has not seen her dermatologist recently.  She complains of occasional skin itching Her appetite is stable. The patient denies any recent signs or symptoms of bleeding such as spontaneous epistaxis, hematuria or hematochezia.   SUMMARY OF HEMATOLOGIC HISTORY:  She was last seen in 2017, after referral for abnormal CBC Her total white blood cell count usually range slightly higher than upper limits of normal, between  11.5-12.9  With associated mild thrombocytopenia. Differential confirm mild neutrophilia She was recommend observation only She was subsequently referred back to me in August 2019 due to progressive thrombocytopenia. CT scan showed no evidence of lymphoma.  She has responded well to low-dose prednisone therapy that was prescribed to treat her hives On 10/03/2017, she received 1 dose of IVIG with no response to therapy.  She also developed serum sickness after that. On October 25, 2017, she started on Promacta 25 mg daily along with prednisone taper On November 12, 2017, prednisone dose is reduced to 10 mg alternate with 5 mg every other day along with Promacta 25 mg daily On November 26, 2017, prednisone dose is reduced to 5 mg alternate with 0 mg every other day along with Promacta 25 mg daily and prednisone was subsequently discontinued On January 02, 2018, the dose of Promacta is increased to 50 mg daily  I have reviewed the past medical history, past surgical history, social history and family history with the patient and they are unchanged from previous note.  ALLERGIES:  is allergic to pine; celebrex [celecoxib]; and zyrtec [cetirizine].  MEDICATIONS:  Current Outpatient Medications  Medication Sig Dispense Refill  . atenolol (TENORMIN) 25 MG tablet Take 12.5 mg by mouth daily.  0  . Cholecalciferol (VITAMIN D PO) Take 2,000 Units by mouth.    . eltrombopag (PROMACTA) 50 MG tablet Take 1 tablet (50 mg total) by mouth daily. Increased dose due to progressive thrombocytopenia 30 tablet 1  . escitalopram (LEXAPRO) 10 MG tablet Take 10 mg by mouth daily.  3  . furosemide (LASIX)  40 MG tablet TAKE 1 TABLET BY MOUTH EVERY DAY 30 tablet 1  . lisinopril (PRINIVIL,ZESTRIL) 20 MG tablet Take 1 tablet (20 mg total) by mouth daily. 90 tablet 1  . Multiple Vitamins-Minerals (MULTIVITAMIN WITH MINERALS) tablet Take 1 tablet by mouth at bedtime.     No current facility-administered medications for this  visit.      REVIEW OF SYSTEMS:   Constitutional: Denies fevers, chills or night sweats Eyes: Denies blurriness of vision Ears, nose, mouth, throat, and face: Denies mucositis or sore throat Respiratory: Denies cough, dyspnea or wheezes Cardiovascular: Denies palpitation, chest discomfort  Gastrointestinal:  Denies nausea, heartburn or change in bowel habits Lymphatics: Denies new lymphadenopathy or easy bruising Neurological:Denies numbness, tingling or new weaknesses Behavioral/Psych: Mood is stable, no new changes  All other systems were reviewed with the patient and are negative.  PHYSICAL EXAMINATION: ECOG PERFORMANCE STATUS: 2 - Symptomatic, <50% confined to bed  Vitals:   01/29/18 1025  BP: (!) 124/56  Pulse: 62  Resp: 18  Temp: 97.7 F (36.5 C)  SpO2: 96%   Filed Weights   01/29/18 1025  Weight: 178 lb 12.8 oz (81.1 kg)    GENERAL:alert, no distress and comfortable SKIN: She has a bandage over the left cheek over the keratoma HEART: Noted moderate lower extremity edema with signs of chronic venous stasis Musculoskeletal:no cyanosis of digits and no clubbing  NEURO: alert & oriented x 3 with fluent speech, no focal motor/sensory deficits  LABORATORY DATA:  I have reviewed the data as listed     Component Value Date/Time   NA 142 01/29/2018 0955   K 4.4 01/29/2018 0955   CL 104 01/29/2018 0955   CO2 26 01/29/2018 0955   GLUCOSE 122 (H) 01/29/2018 0955   BUN 15 01/29/2018 0955   CREATININE 1.10 (H) 01/29/2018 0955   CREATININE 0.95 12/24/2017 0735   CALCIUM 9.7 01/29/2018 0955   PROT 7.2 01/29/2018 0955   ALBUMIN 4.0 01/29/2018 0955   AST 52 (H) 01/29/2018 0955   AST 16 12/24/2017 0735   ALT 59 (H) 01/29/2018 0955   ALT 15 12/24/2017 0735   ALKPHOS 55 01/29/2018 0955   BILITOT 0.9 01/29/2018 0955   BILITOT 0.7 12/24/2017 0735   GFRNONAA 47 (L) 01/29/2018 0955   GFRNONAA 54 (L) 12/24/2017 0735   GFRAA 54 (L) 01/29/2018 0955   GFRAA >60 12/24/2017  0735    No results found for: SPEP, UPEP  Lab Results  Component Value Date   WBC 3.2 (L) 01/29/2018   NEUTROABS 2.4 01/29/2018   HGB 14.2 01/29/2018   HCT 43.9 01/29/2018   MCV 93.0 01/29/2018   PLT 67 (L) 01/29/2018      Chemistry      Component Value Date/Time   NA 142 01/29/2018 0955   K 4.4 01/29/2018 0955   CL 104 01/29/2018 0955   CO2 26 01/29/2018 0955   BUN 15 01/29/2018 0955   CREATININE 1.10 (H) 01/29/2018 0955   CREATININE 0.95 12/24/2017 0735      Component Value Date/Time   CALCIUM 9.7 01/29/2018 0955   ALKPHOS 55 01/29/2018 0955   AST 52 (H) 01/29/2018 0955   AST 16 12/24/2017 0735   ALT 59 (H) 01/29/2018 0955   ALT 15 12/24/2017 0735   BILITOT 0.9 01/29/2018 0955   BILITOT 0.7 12/24/2017 0735       I spent 15 minutes counseling the patient face to face. The total time spent in the appointment was 20 minutes  and more than 50% was on counseling.   All questions were answered. The patient knows to call the clinic with any problems, questions or concerns. No barriers to learning was detected.    Heath Lark, MD 12/31/201910:50 AM

## 2018-01-29 NOTE — Assessment & Plan Note (Signed)
She still have significant fluid retention on both legs.  Her mobility is poor We discussed dietary modification and increase exercise as tolerated She is also reminded to take elastic compression hose She has a furosemide to take as needed, with goal dry weight around 177 pounds

## 2018-01-29 NOTE — Assessment & Plan Note (Signed)
She has stable platelet count on 50 mg of Promacta We will continue the same dose without dose adjustment

## 2018-01-29 NOTE — Assessment & Plan Note (Signed)
She has minor injury to her face from her attempt to squeeze keratoma.  I recommend close follow-up with dermatologist. She also complained of skin itching.  Her liver function tests were within normal limits and prior CT imaging show no evidence of malignancy. I recommend topical emollient cream to avoid dry skin is a contributing factor to the skin itching

## 2018-01-29 NOTE — Telephone Encounter (Signed)
Gave avs and calendar ° °

## 2018-01-29 NOTE — Assessment & Plan Note (Signed)
She has chronic leukopenia of unknown etiology Her serum vitamin B12 were at the lower limits of normal I recommend oral vitamin B12 replacement therapy 1000 MCG daily

## 2018-02-05 MED FILL — PROMACTA 50 MG TABLET: 50 | 30 days supply | Qty: 30 | Fill #1

## 2018-02-08 ENCOUNTER — Emergency Department (HOSPITAL_COMMUNITY): Payer: Medicare Other

## 2018-02-08 ENCOUNTER — Other Ambulatory Visit: Payer: Self-pay

## 2018-02-08 ENCOUNTER — Encounter (HOSPITAL_COMMUNITY): Payer: Self-pay

## 2018-02-08 ENCOUNTER — Inpatient Hospital Stay (HOSPITAL_COMMUNITY)
Admission: EM | Admit: 2018-02-08 | Discharge: 2018-02-11 | DRG: 683 | Disposition: A | Discharge status: Home Health | Payer: Medicare Other | Attending: Family Medicine | Admitting: Family Medicine

## 2018-02-08 DIAGNOSIS — I129 Hypertensive chronic kidney disease with stage 1 through stage 4 chronic kidney disease, or unspecified chronic kidney disease: Secondary | ICD-10-CM | POA: Diagnosis present

## 2018-02-08 DIAGNOSIS — Z90710 Acquired absence of both cervix and uterus: Secondary | ICD-10-CM | POA: Diagnosis not present

## 2018-02-08 DIAGNOSIS — R112 Nausea with vomiting, unspecified: Secondary | ICD-10-CM

## 2018-02-08 DIAGNOSIS — E871 Hypo-osmolality and hyponatremia: Secondary | ICD-10-CM | POA: Diagnosis not present

## 2018-02-08 DIAGNOSIS — W19XXXA Unspecified fall, initial encounter: Secondary | ICD-10-CM | POA: Diagnosis present

## 2018-02-08 DIAGNOSIS — Z800 Family history of malignant neoplasm of digestive organs: Secondary | ICD-10-CM

## 2018-02-08 DIAGNOSIS — E538 Deficiency of other specified B group vitamins: Secondary | ICD-10-CM | POA: Diagnosis present

## 2018-02-08 DIAGNOSIS — N183 Chronic kidney disease, stage 3 (moderate): Secondary | ICD-10-CM | POA: Diagnosis not present

## 2018-02-08 DIAGNOSIS — N179 Acute kidney failure, unspecified: Secondary | ICD-10-CM | POA: Diagnosis present

## 2018-02-08 DIAGNOSIS — F329 Major depressive disorder, single episode, unspecified: Secondary | ICD-10-CM | POA: Diagnosis not present

## 2018-02-08 DIAGNOSIS — R778 Other specified abnormalities of plasma proteins: Secondary | ICD-10-CM

## 2018-02-08 DIAGNOSIS — E860 Dehydration: Secondary | ICD-10-CM | POA: Diagnosis present

## 2018-02-08 DIAGNOSIS — K760 Fatty (change of) liver, not elsewhere classified: Secondary | ICD-10-CM | POA: Diagnosis present

## 2018-02-08 DIAGNOSIS — L03211 Cellulitis of face: Secondary | ICD-10-CM | POA: Diagnosis present

## 2018-02-08 DIAGNOSIS — Z79890 Hormone replacement therapy (postmenopausal): Secondary | ICD-10-CM

## 2018-02-08 DIAGNOSIS — B9562 Methicillin resistant Staphylococcus aureus infection as the cause of diseases classified elsewhere: Secondary | ICD-10-CM | POA: Diagnosis present

## 2018-02-08 DIAGNOSIS — E039 Hypothyroidism, unspecified: Secondary | ICD-10-CM | POA: Diagnosis present

## 2018-02-08 DIAGNOSIS — R740 Nonspecific elevation of levels of transaminase and lactic acid dehydrogenase [LDH]: Secondary | ICD-10-CM | POA: Diagnosis not present

## 2018-02-08 DIAGNOSIS — D693 Immune thrombocytopenic purpura: Secondary | ICD-10-CM | POA: Diagnosis present

## 2018-02-08 DIAGNOSIS — Z888 Allergy status to other drugs, medicaments and biological substances status: Secondary | ICD-10-CM | POA: Diagnosis not present

## 2018-02-08 DIAGNOSIS — Z79899 Other long term (current) drug therapy: Secondary | ICD-10-CM | POA: Diagnosis not present

## 2018-02-08 DIAGNOSIS — R7989 Other specified abnormal findings of blood chemistry: Secondary | ICD-10-CM | POA: Diagnosis not present

## 2018-02-08 DIAGNOSIS — N281 Cyst of kidney, acquired: Secondary | ICD-10-CM | POA: Diagnosis not present

## 2018-02-08 DIAGNOSIS — R7401 Elevation of levels of liver transaminase levels: Secondary | ICD-10-CM

## 2018-02-08 DIAGNOSIS — R197 Diarrhea, unspecified: Secondary | ICD-10-CM | POA: Diagnosis not present

## 2018-02-08 DIAGNOSIS — Z9049 Acquired absence of other specified parts of digestive tract: Secondary | ICD-10-CM

## 2018-02-08 DIAGNOSIS — F419 Anxiety disorder, unspecified: Secondary | ICD-10-CM | POA: Diagnosis present

## 2018-02-08 DIAGNOSIS — I351 Nonrheumatic aortic (valve) insufficiency: Secondary | ICD-10-CM | POA: Diagnosis not present

## 2018-02-08 DIAGNOSIS — R911 Solitary pulmonary nodule: Secondary | ICD-10-CM | POA: Diagnosis present

## 2018-02-08 DIAGNOSIS — S0990XA Unspecified injury of head, initial encounter: Secondary | ICD-10-CM | POA: Diagnosis not present

## 2018-02-08 DIAGNOSIS — K529 Noninfective gastroenteritis and colitis, unspecified: Secondary | ICD-10-CM | POA: Diagnosis present

## 2018-02-08 DIAGNOSIS — Z91018 Allergy to other foods: Secondary | ICD-10-CM

## 2018-02-08 DIAGNOSIS — Z66 Do not resuscitate: Secondary | ICD-10-CM | POA: Diagnosis present

## 2018-02-08 DIAGNOSIS — R531 Weakness: Secondary | ICD-10-CM | POA: Diagnosis not present

## 2018-02-08 DIAGNOSIS — I4510 Unspecified right bundle-branch block: Secondary | ICD-10-CM | POA: Diagnosis not present

## 2018-02-08 HISTORY — DX: Decreased white blood cell count, unspecified: D72.819

## 2018-02-08 HISTORY — DX: Immune thrombocytopenic purpura: D69.3

## 2018-02-08 HISTORY — DX: Localized edema: R60.0

## 2018-02-08 LAB — CBC
HCT: 40.4 % (ref 36.0–46.0)
Hemoglobin: 13.4 g/dL (ref 12.0–15.0)
MCH: 30.7 pg (ref 26.0–34.0)
MCHC: 33.2 g/dL (ref 30.0–36.0)
MCV: 92.7 fL (ref 80.0–100.0)
Platelets: 52 10*3/uL — ABNORMAL LOW (ref 150–400)
RBC: 4.36 MIL/uL (ref 3.87–5.11)
RDW: 15.4 % (ref 11.5–15.5)
WBC: 4.5 10*3/uL (ref 4.0–10.5)
nRBC: 0 % (ref 0.0–0.2)

## 2018-02-08 LAB — URINALYSIS, ROUTINE W REFLEX MICROSCOPIC
BILIRUBIN URINE: NEGATIVE
Glucose, UA: NEGATIVE mg/dL
Ketones, ur: NEGATIVE mg/dL
Leukocytes, UA: NEGATIVE
Nitrite: NEGATIVE
Protein, ur: 30 mg/dL — AB
Specific Gravity, Urine: 1.013 (ref 1.005–1.030)
pH: 5 (ref 5.0–8.0)

## 2018-02-08 LAB — COMPREHENSIVE METABOLIC PANEL
ALT: 42 U/L (ref 0–44)
AST: 48 U/L — AB (ref 15–41)
Albumin: 3.5 g/dL (ref 3.5–5.0)
Alkaline Phosphatase: 35 U/L — ABNORMAL LOW (ref 38–126)
Anion gap: 15 (ref 5–15)
BUN: 48 mg/dL — AB (ref 8–23)
CO2: 16 mmol/L — ABNORMAL LOW (ref 22–32)
CREATININE: 1.93 mg/dL — AB (ref 0.44–1.00)
Calcium: 8.3 mg/dL — ABNORMAL LOW (ref 8.9–10.3)
Chloride: 100 mmol/L (ref 98–111)
GFR calc Af Amer: 27 mL/min — ABNORMAL LOW (ref >60)
GFR, EST NON AFRICAN AMERICAN: 24 mL/min — AB (ref >60)
Glucose, Bld: 108 mg/dL — ABNORMAL HIGH (ref 70–99)
Potassium: 3.5 mmol/L (ref 3.5–5.1)
Sodium: 131 mmol/L — ABNORMAL LOW (ref 135–145)
Total Bilirubin: 0.6 mg/dL (ref 0.3–1.2)
Total Protein: 7.2 g/dL (ref 6.5–8.1)

## 2018-02-08 LAB — TROPONIN I: Troponin I: 0.06 ng/mL (ref <0.03)

## 2018-02-08 LAB — LIPASE, BLOOD: Lipase: 35 U/L (ref 11–51)

## 2018-02-08 MED ORDER — ONDANSETRON HCL 4 MG/2ML IJ SOLN: 4.0000 mg | INTRAMUSCULAR | Status: DC | PRN

## 2018-02-08 MED ORDER — SODIUM CHLORIDE 0.9 % IV SOLN
INTRAVENOUS | Status: DC
  Administered 2018-02-08: 22:00:00 via INTRAVENOUS

## 2018-02-08 MED ORDER — SODIUM CHLORIDE 0.9 % IV BOLUS
500.0000 mL | Freq: Once | INTRAVENOUS | Status: AC
  Administered 2018-02-08: 500 mL via INTRAVENOUS

## 2018-02-08 NOTE — H&P (Addendum)
History and Physical    Claudia Moore EQA:[id number] DOB: [birth year] DOA: 02/08/2018  PCP: Lujean Amel, MD  Patient coming from: home   Chief Complaint: diarrhea  HPI: Claudia Moore is a 85 y.o. adult with medical history significant for hypothyroid, htn, ITP, chronic leukopenia (followed by heme), who presents with above.  Symptoms began 3-4 days ago. Began with nausea and dry heaves, then shortly thereafter developed watery diarrhea that has continued, though improved with immodium. No blood or mucous. No fevers. No actual vomiting. Decreased appetite; po has been limited. General abdominal discomfort but no pain, nothing focal. No dysuria, no hematuria, no urgency, no suprapubic pain, no new urinary incontinence. No recent antibiotics. No recent travel. Husband had a mild case of diarrhea that started around the same time and has since resolved. This has not happened before. No chest pain, no sob or DOE. No cough or shortness of breath, no runny nose.   ED Course: IV fluids, labs, imaging  Review of Systems: As per HPI otherwise 10 point review of systems negative.    Past Medical History:  Diagnosis Date  . Bilateral leg edema   . Chronic ITP (idiopathic thrombocytopenia) (HCC)   . Chronic leukopenia   . Depression   . Hypertension   . Thyroid disease     Past Surgical History:  Procedure Laterality Date  . ABDOMINAL HYSTERECTOMY     endometriosis   . APPENDECTOMY    . CHOLECYSTECTOMY    . COLONOSCOPY    . TONSILLECTOMY       reports that he has never smoked. He has never used smokeless tobacco. He reports that he does not drink alcohol or use drugs.  Allergies  Allergen Reactions  . Pine Anaphylaxis    PIne Nuts  . Celebrex [Celecoxib] Other (See Comments)    Increase Heart Rate, Sweats and Nausea  . Zyrtec [Cetirizine] Other (See Comments)    Rapid heart rate and dizziness    Family History  Problem Relation Age of Onset  . Cancer Mother        brain  tumor  . Cancer Father        colon cancer    Prior to Admission medications   Medication Sig Start Date End Date Taking? Authorizing Provider  atenolol (TENORMIN) 25 MG tablet Take 12.5 mg by mouth daily. 11/24/14  Yes [provider]  Cholecalciferol (VITAMIN D) 50 MCG (2000 UT) tablet Take 2,000 Units by mouth daily.   Yes [provider]  eltrombopag (PROMACTA) 50 MG tablet Take 1 tablet (50 mg total) by mouth daily. Increased dose due to progressive thrombocytopenia 01/01/18  Yes Gorsuch, Ni, MD  escitalopram (LEXAPRO) 10 MG tablet Take 10 mg by mouth daily. 01/05/15  Yes [provider]  furosemide (LASIX) 40 MG tablet TAKE 1 TABLET BY MOUTH EVERY DAY Patient taking differently: Take 40 mg by mouth daily as needed for fluid. Weight greater than >180 01/24/18  Yes Gorsuch, Ni, MD  levothyroxine (SYNTHROID, LEVOTHROID) 100 MCG tablet Take 100 mcg by mouth daily before breakfast.   Yes [provider]  lisinopril (PRINIVIL,ZESTRIL) 20 MG tablet Take 1 tablet (20 mg total) by mouth daily. 01/11/18  Yes Gorsuch, Ni, MD  loperamide (IMODIUM A-D) 2 MG tablet Take 2 mg by mouth 4 (four) times daily as needed for diarrhea or loose stools.   Yes [provider]  Multiple Vitamins-Minerals (MULTIVITAMIN GUMMIES ADULT) CHEW Chew 1 tablet by mouth at bedtime.   Yes  [provider]    Physical Exam: Vitals:   02/08/18 1753 02/08/18 1755 02/08/18 2134  BP: 105/77  (!) 111/52  Pulse: 96  85  Resp: 18  (!) 24  Temp: 99 F (37.2 C)    TempSrc: Oral    SpO2: 95%  96%  Weight:  81.1 kg   Height:  5\' 2"  (1.575 m)     Constitutional: No acute distress Head: Atraumatic Eyes: Conjunctiva clear ENM: dry mucous membranes. Normal dentition.  Neck: Supple Respiratory: Clear to auscultation bilaterally, no wheezing/rales/rhonchi. Normal respiratory effort. No accessory muscle use. . Cardiovascular: Regular rate and rhythm. No  murmurs/rubs/gallops. Abdomen: mild diffuse ttp, mildly distended. No masses. No rebound or guarding. Positive bowel sounds. Musculoskeletal: No joint deformity upper and lower extremities. Normal ROM, no contractures. Normal muscle tone.  Skin: No rashes, lesions, or ulcers.  Extremities: No peripheral edema. Palpable peripheral pulses. Neurologic: Alert, moving all 4 extremities. Psychiatric: Normal insight and judgement.   Labs on Admission: I have personally reviewed following labs and imaging studies  CBC: Recent Labs  Lab 02/08/18 1850  WBC 4.5  HGB 13.4  HCT 40.4  MCV 92.7  PLT 52*   Basic Metabolic Panel: Recent Labs  Lab 02/08/18 1850  NA 131*  K 3.5  CL 100  CO2 16*  GLUCOSE 108*  BUN 48*  CREATININE 1.93*  CALCIUM 8.3*   GFR: Estimated Creatinine Clearance (by C-G formula based on SCr of 1.93 mg/dL (H)) Female: 22.2 mL/min (A) Female: 27.2 mL/min (A) Liver Function Tests: Recent Labs  Lab 02/08/18 1850  AST 48*  ALT 42  ALKPHOS 35*  BILITOT 0.6  PROT 7.2  ALBUMIN 3.5   Recent Labs  Lab 02/08/18 1850  LIPASE 35   No results for input(s): AMMONIA in the last 168 hours. Coagulation Profile: No results for input(s): INR, PROTIME in the last 168 hours. Cardiac Enzymes: Recent Labs  Lab 02/08/18 2102  TROPONINI 0.06*   BNP (last 3 results) No results for input(s): PROBNP in the last 8760 hours. HbA1C: No results for input(s): HGBA1C in the last 72 hours. CBG: No results for input(s): GLUCAP in the last 168 hours. Lipid Profile: No results for input(s): CHOL, HDL, LDLCALC, TRIG, CHOLHDL, LDLDIRECT in the last 72 hours. Thyroid Function Tests: No results for input(s): TSH, T4TOTAL, FREET4, T3FREE, THYROIDAB in the last 72 hours. Anemia Panel: No results for input(s): VITAMINB12, FOLATE, FERRITIN, TIBC, IRON, RETICCTPCT in the last 72 hours. Urine analysis:    Component Value Date/Time   COLORURINE YELLOW 02/08/2018 1850   APPEARANCEUR  HAZY (A) 02/08/2018 1850   LABSPEC 1.013 02/08/2018 1850   PHURINE 5.0 02/08/2018 1850   GLUCOSEU NEGATIVE 02/08/2018 1850   HGBUR SMALL (A) 02/08/2018 1850   BILIRUBINUR NEGATIVE 02/08/2018 [place] 02/08/2018 1850   PROTEINUR 30 (A) 02/08/2018 1850   NITRITE NEGATIVE 02/08/2018 1850   LEUKOCYTESUR NEGATIVE 02/08/2018 1850    Radiological Exams on Admission: Ct Abdomen Pelvis Wo Contrast  Result Date: 02/08/2018 CLINICAL DATA:  Watery diarrhea. Abdominal pain. EXAM: CT ABDOMEN AND PELVIS WITHOUT CONTRAST TECHNIQUE: Multidetector CT imaging of the abdomen and pelvis was performed following the standard protocol without IV contrast. COMPARISON:  09/14/2017 FINDINGS: Lower chest: 5 mm posterior right middle lobe pulmonary nodule is stable. Hepatobiliary: The liver shows diffusely decreased attenuation suggesting steatosis. 2.6 cm cyst in the lateral segment left liver is stable. Gallbladder surgically absent. No intrahepatic or extrahepatic biliary dilation. Pancreas: No focal mass  lesion. No dilatation of the main duct. No intraparenchymal cyst. No peripancreatic edema. Spleen: Calcified granulomata. Adrenals/Urinary Tract: No adrenal nodule or mass. Right kidney unremarkable. Central sinus cysts in the left kidney well demonstrated on prior study with contrast material. Small low-density lesions in the left kidney are compatible with cysts, also better demonstrated previously. No evidence for hydroureter. The urinary bladder appears normal for the degree of distention. Stomach/Bowel: Stomach is nondistended. No gastric wall thickening. No evidence of outlet obstruction. Duodenum is normally positioned as is the ligament of Treitz. No small bowel wall thickening. No small bowel dilatation. The terminal ileum is normal. The appendix is not visualized, but there is no edema or inflammation in the region of the cecum. No gross colonic mass. No colonic wall thickening. Vascular/Lymphatic:  There is abdominal aortic atherosclerosis without aneurysm. There is no gastrohepatic or hepatoduodenal ligament lymphadenopathy. No intraperitoneal or retroperitoneal lymphadenopathy. No pelvic sidewall lymphadenopathy. Reproductive: Uterus surgically absent.  There is no adnexal mass. Other: No intraperitoneal free fluid. Musculoskeletal: No worrisome lytic or sclerotic osseous abnormality. IMPRESSION: 1. No acute findings in the abdomen or pelvis. Specifically, no findings to explain the patient's history of diarrhea and pain. 2. Hepatic and left renal cysts. 3. Stable 5 mm right middle lobe pulmonary nodule since 09/14/2017. No follow-up needed if patient is low-risk. Non-contrast chest CT can be considered in 12 months if patient is high-risk. This recommendation follows the consensus statement: Guidelines for Management of Incidental Pulmonary Nodules Detected on CT Images: From the Fleischner Society 2017; Radiology 2017; 284:228-243. 4.  Aortic Atherosclerois (ICD10-170.0) Electronically Signed   By: Misty Stanley M.D.   On: 02/08/2018 21:25   Dg Chest 2 View  Result Date: 02/08/2018 CLINICAL DATA:  Abdominal pain, weakness, nausea, vomiting, diarrhea. EXAM: CHEST - 2 VIEW COMPARISON:  12/20/2011 FINDINGS: The heart size and mediastinal contours are within normal limits. Both lungs are clear. The visualized skeletal structures are unremarkable. IMPRESSION: No active cardiopulmonary disease. Electronically Signed   By: Lucienne Capers M.D.   On: 02/08/2018 21:31   Ct Head Wo Contrast  Result Date: 02/08/2018 CLINICAL DATA:  Head trauma, follow-up EXAM: CT HEAD WITHOUT CONTRAST TECHNIQUE: Contiguous axial images were obtained from the base of the skull through the vertex without intravenous contrast. COMPARISON:  None. FINDINGS: Brain: Patchy subcortical white matter hypodensities more so along the left parietal lobe are identified consistent with chronic microvascular ischemic disease. Mild  involutional changes of the brain are noted. No large vascular territory infarction, hemorrhage, midline shift or edema. Midline fourth ventricle and basal cisterns without effacement. Vascular: Atherosclerosis of the carotid siphons bilaterally. No hyperdense vessel sign. Skull: Intact Sinuses/Orbits: Partially included calcified density projects over the right maxilla possibly representing inspissated/desiccated mucus or partial volume averaging of the floor of the maxillary sinus or osteoma. Intact orbits and globes. Bilateral cataract extractions. Other: None IMPRESSION: Chronic appearing microvascular ischemic disease of periventricular and subcortical white matter. No acute intracranial abnormality. Electronically Signed   By: Ashley Royalty M.D.   On: 02/08/2018 21:21    EKG: Independently reviewed. TWIs inferomedial leads, RBB  Assessment/Plan Active Problems:   Acute gastroenteritis   AKI (acute kidney injury) (HCC)   Hyponatremia   Elevated troponin   Elevated AST (SGOT)   # Acute gastroenteritis - several days nausea and diarrhea. Age and need for hospitalization represent risk factors. No acute findings on CT, which is reassuring. Hemodynamically stable. - normal saline - f/u urine culture (no overt UTI symptoms) and  flu panel (no overt respiratory symptoms) - azithromycin - f/u gi pathogen panel and c diff screen - clear liquids, advance as tolerated  # Acute kidney injury - cr 1.93, from baseline of around 1. Likely prerenal 2/2 dehydration. S/p fluid resuscitation in ED - IV fluids as above, repeat cmp in AM  # Elevated troponin - likely demand 2/2 acute illness; decreased gfr likely also contributory. EKG non-ischemic; pt denies chest pain or dyspnea. Possible atypical presentation of ACS - trend troponins - telemetry - AM EKG  # Hyponatremia # Metabolic acidosis # elevated AST - likely 2/2 dehydration - fluid resuscitation as above, CMP in AM  # HTN - hold home  lisionpril and prn lasix 2/2 acute illness - cont home atenolol for now  # Anxiety - cont home lexapro  # ITP - plts 50s, low end of patient's normal. Normal RBCs. - f/u AM platelets, continue home promacta  # pulmonary nodule - small, stable from prior cxr - outpt f/u  # Hypothyroid - cont home levothyroxine    DVT prophylaxis: heparin Code Status:  Dnr, confirmed with family present Family Communication: husband bob [phone]  Disposition Plan: tbd  Consults called: none  Admission status: tele    Desma Maxim MD [facility] Pager [phone]  If 7PM-7AM, please contact night-coverage [email or web address] Password [email or web address]  02/08/2018, 10:53 PM

## 2018-02-08 NOTE — ED Notes (Signed)
Patient transported to CT 

## 2018-02-08 NOTE — ED Triage Notes (Signed)
Pt has hx of ITP. Pt states became ill Tuesday night. Pt states that she fell out of her chair, and had watery diarrhea. Pt states she has had dry heaving as well. Pt denies abdominal pain, but states she does not want to eat. Pt states she has been drinking a lot because she doesn't want to be dehydrated.

## 2018-02-08 NOTE — ED Notes (Addendum)
Patient's daughter, Hanley Ben, [phone]. States she can be called at any time for concerns.

## 2018-02-08 NOTE — ED Provider Notes (Signed)
[facility] DEPT Provider Note   CSN: [id number] Arrival date & time: 02/08/18  3086     History   Chief Complaint Chief Complaint  Patient presents with  . Fall  . Diarrhea    HPI Claudia Moore is a 85 y.o. adult.  HPI Pt was seen at 2055. Per pt and her family, c/o gradual onset and slow improvement of multiple intermittent episodes of diarrhea that began 3 days ago. Describes the stools as "watery." Has been associated with decreased appetite, dry heaving and generalized abd "cramping." Pt's spouse had similar symptoms 3 days ago. Family states pt fell while getting out of a chair 3 days ago. Pt states she believes she got "caught up" in the heating pad she had on her. Denies abd pain, no CP/SOB, no back pain, no fevers, no black or blood in stools, no LOC, no AMS, no neck or back pain, no focal motor weakness, no tingling/numbness in extremities.     Past Medical History:  Diagnosis Date  . Bilateral leg edema   . Chronic ITP (idiopathic thrombocytopenia) (HCC)   . Chronic leukopenia   . Depression   . Hypertension   . Thyroid disease     Patient Active Problem List   Diagnosis Date Noted  . Skin lesion 01/29/2018  . Leukopenia 01/10/2018  . Tremor of both hands 01/02/2018  . Vitamin B12 deficiency 01/01/2018  . Preventive measure 10/30/2017  . Goals of care, counseling/discussion 10/16/2017  . Serum sickness due to drug 10/10/2017  . Hyperuricemia 09/26/2017  . Chronic ITP (idiopathic thrombocytopenia) (HCC) 09/25/2017  . Hives 09/07/2017  . Gout 09/07/2017  . Bilateral leg edema 09/07/2017  . Thrombocytopenia ([place]) 02/12/2015  . Nonalcoholic fatty liver disease 02/12/2015  . Bile salt-induced diarrhea 02/12/2015  . Keratosis, seborrheic 02/12/2015    Past Surgical History:  Procedure Laterality Date  . ABDOMINAL HYSTERECTOMY     endometriosis   . APPENDECTOMY    . CHOLECYSTECTOMY    . COLONOSCOPY    . TONSILLECTOMY        OB History   No obstetric history on file.      Home Medications    Prior to Admission medications   Medication Sig Start Date End Date Taking? Authorizing Provider  atenolol (TENORMIN) 25 MG tablet Take 12.5 mg by mouth daily. 11/24/14   [provider]  Cholecalciferol (VITAMIN D PO) Take 2,000 Units by mouth.    [provider]  eltrombopag (PROMACTA) 50 MG tablet Take 1 tablet (50 mg total) by mouth daily. Increased dose due to progressive thrombocytopenia 01/01/18   Heath Lark, MD  escitalopram (LEXAPRO) 10 MG tablet Take 10 mg by mouth daily. 01/05/15   [provider]  furosemide (LASIX) 40 MG tablet TAKE 1 TABLET BY MOUTH EVERY DAY 01/24/18   Heath Lark, MD  lisinopril (PRINIVIL,ZESTRIL) 20 MG tablet Take 1 tablet (20 mg total) by mouth daily. 01/11/18   Heath Lark, MD  Multiple Vitamins-Minerals (MULTIVITAMIN WITH MINERALS) tablet Take 1 tablet by mouth at bedtime.    [provider]    Family History Family History  Problem Relation Age of Onset  . Cancer Mother        brain tumor  . Cancer Father        colon cancer    Social History Social History   Tobacco Use  . Smoking status: Never Smoker  . Smokeless tobacco: Never Used  Substance Use Topics  . Alcohol use:  No  . Drug use: No     Allergies   [place]; Celebrex [celecoxib]; and Zyrtec [cetirizine]   Review of Systems Review of Systems ROS: Statement: All systems negative except as marked or noted in the HPI; Constitutional: Negative for fever and chills. ; ; Eyes: Negative for eye pain, redness and discharge. ; ; ENMT: Negative for ear pain, hoarseness, nasal congestion, sinus pressure and sore throat. ; ; Cardiovascular: Negative for chest pain, palpitations, diaphoresis, dyspnea and peripheral edema. ; ; Respiratory: Negative for cough, wheezing and stridor. ; ; Gastrointestinal: +nausea, dry heaving, diarrhea, decreased PO intake. Negative for abdominal pain,  blood in stool, hematemesis, jaundice and rectal bleeding. . ; ; Genitourinary: Negative for dysuria, flank pain and hematuria. ; ; Musculoskeletal: Negative for back pain and neck pain. Negative for swelling and trauma.; ; Skin: Negative for pruritus, rash, abrasions, blisters, bruising and skin lesion.; ; Neuro: Negative for headache, lightheadedness and neck stiffness. Negative for weakness, altered level of consciousness, altered mental status, extremity weakness, paresthesias, involuntary movement, seizure and syncope.       Physical Exam Updated Vital Signs BP (!) 111/52 (BP Location: Left Arm)   Pulse 85   Temp 99 F (37.2 C) (Oral)   Resp (!) 24   Ht 5\' 2"  (1.575 m)   Wt 81.1 kg   SpO2 96%   BMI 32.70 kg/m   Physical Exam 2100: Physical examination:  Nursing notes reviewed; Vital signs and O2 SAT reviewed;  Constitutional: Well developed, Well nourished, Well hydrated, In no acute distress; Head:  Normocephalic, atraumatic; Eyes: EOMI, PERRL, No scleral icterus; ENMT: Mouth and pharynx normal, Mucous membranes moist; Neck: Supple, Full range of motion, No lymphadenopathy; Cardiovascular: Regular rate and rhythm, No gallop; Respiratory: Breath sounds clear & equal bilaterally, No wheezes.  Speaking full sentences with ease, Normal respiratory effort/excursion; Chest: Nontender, Movement normal; Abdomen: Soft, +mild diffuse tenderness to palp. No rebound or guarding. Nondistended, Normal bowel sounds; Genitourinary: No CVA tenderness; Extremities: Peripheral pulses normal, No tenderness, +2 pedal edema bilat. No calf asymmetry.; Neuro: AA&Ox3, Major CN grossly intact.  Speech clear. No gross focal motor or sensory deficits in extremities.; Skin: Color normal, Warm, Dry.   ED Treatments / Results  Labs (all labs ordered are listed, but only abnormal results are displayed)   EKG EKG Interpretation  Date/Time:  Friday February 08 2018 21:33:06 EST Ventricular Rate:  91 PR  Interval:    QRS Duration: 123 QT Interval:  381 QTC Calculation: 469 R Axis:   -53 Text Interpretation:  Sinus rhythm RBBB and LAFB Nonspecific T abnormalities, lateral leads No old tracing to compare Confirmed by Francine Graven [phone]) on 02/08/2018 9:40:00 PM   Radiology   Procedures Procedures (including critical care time)  Medications Ordered in ED Medications  0.9 %  sodium chloride infusion (has no administration in time range)  sodium chloride 0.9 % bolus 500 mL (has no administration in time range)     Initial Impression / Assessment and Plan / ED Course  I have reviewed the triage vital signs and the nursing notes.  Pertinent labs & imaging results that were available during my care of the patient were reviewed by me and considered in my medical decision making (see chart for details).  MDM Reviewed: previous chart, nursing note and vitals Reviewed previous: labs and ECG Interpretation: labs, ECG, x-ray and CT scan   Results for orders placed or performed during the hospital encounter of 02/08/18  Lipase, blood  Result  Value Ref Range   Lipase 35 11 - 51 U/L  Comprehensive metabolic panel  Result Value Ref Range   Sodium 131 (L) 135 - 145 mmol/L   Potassium 3.5 3.5 - 5.1 mmol/L   Chloride 100 98 - 111 mmol/L   CO2 16 (L) 22 - 32 mmol/L   Glucose, Bld 108 (H) 70 - 99 mg/dL   BUN 48 (H) 8 - 23 mg/dL   Creatinine, Ser 1.93 (H) 0.44 - 1.00 mg/dL   Calcium 8.3 (L) 8.9 - 10.3 mg/dL   Total Protein 7.2 6.5 - 8.1 g/dL   Albumin 3.5 3.5 - 5.0 g/dL   AST 48 (H) 15 - 41 U/L   ALT 42 0 - 44 U/L   Alkaline Phosphatase 35 (L) 38 - 126 U/L   Total Bilirubin 0.6 0.3 - 1.2 mg/dL   GFR calc non Af Amer 24 (L) >60 mL/min   GFR calc Af Amer 27 (L) >60 mL/min   Anion gap 15 5 - 15  CBC  Result Value Ref Range   WBC 4.5 4.0 - 10.5 K/uL   RBC 4.36 3.87 - 5.11 MIL/uL   Hemoglobin 13.4 12.0 - 15.0 g/dL   HCT 40.4 36.0 - 46.0 %   MCV 92.7 80.0 - 100.0 fL   MCH 30.7  26.0 - 34.0 pg   MCHC 33.2 30.0 - 36.0 g/dL   RDW 15.4 11.5 - 15.5 %   Platelets 52 (L) 150 - 400 K/uL   nRBC 0.0 0.0 - 0.2 %  Urinalysis, Routine w reflex microscopic  Result Value Ref Range   Color, Urine YELLOW YELLOW   APPearance HAZY (A) CLEAR   Specific Gravity, Urine 1.013 1.005 - 1.030   pH 5.0 5.0 - 8.0   Glucose, UA NEGATIVE NEGATIVE mg/dL   Hgb urine dipstick SMALL (A) NEGATIVE   Bilirubin Urine NEGATIVE NEGATIVE   Ketones, ur NEGATIVE NEGATIVE mg/dL   Protein, ur 30 (A) NEGATIVE mg/dL   Nitrite NEGATIVE NEGATIVE   Leukocytes, UA NEGATIVE NEGATIVE   RBC / HPF 0-5 0 - 5 RBC/hpf   WBC, UA 6-10 0 - 5 WBC/hpf   Bacteria, UA RARE (A) NONE SEEN   Squamous Epithelial / LPF 0-5 0 - 5   Mucus PRESENT    Hyaline Casts, UA PRESENT    Granular Casts, UA PRESENT    Non Squamous Epithelial 0-5 (A) NONE SEEN  Troponin I - Once  Result Value Ref Range   Troponin I 0.06 (HH) <0.03 ng/mL   Ct Abdomen Pelvis Wo Contrast Result Date: 02/08/2018 CLINICAL DATA:  Watery diarrhea. Abdominal pain. EXAM: CT ABDOMEN AND PELVIS WITHOUT CONTRAST TECHNIQUE: Multidetector CT imaging of the abdomen and pelvis was performed following the standard protocol without IV contrast. COMPARISON:  09/14/2017 FINDINGS: Lower chest: 5 mm posterior right middle lobe pulmonary nodule is stable. Hepatobiliary: The liver shows diffusely decreased attenuation suggesting steatosis. 2.6 cm cyst in the lateral segment left liver is stable. Gallbladder surgically absent. No intrahepatic or extrahepatic biliary dilation. Pancreas: No focal mass lesion. No dilatation of the main duct. No intraparenchymal cyst. No peripancreatic edema. Spleen: Calcified granulomata. Adrenals/Urinary Tract: No adrenal nodule or mass. Right kidney unremarkable. Central sinus cysts in the left kidney well demonstrated on prior study with contrast material. Small low-density lesions in the left kidney are compatible with cysts, also better  demonstrated previously. No evidence for hydroureter. The urinary bladder appears normal for the degree of distention. Stomach/Bowel: Stomach is nondistended. No gastric  wall thickening. No evidence of outlet obstruction. Duodenum is normally positioned as is the ligament of Treitz. No small bowel wall thickening. No small bowel dilatation. The terminal ileum is normal. The appendix is not visualized, but there is no edema or inflammation in the region of the cecum. No gross colonic mass. No colonic wall thickening. Vascular/Lymphatic: There is abdominal aortic atherosclerosis without aneurysm. There is no gastrohepatic or hepatoduodenal ligament lymphadenopathy. No intraperitoneal or retroperitoneal lymphadenopathy. No pelvic sidewall lymphadenopathy. Reproductive: Uterus surgically absent.  There is no adnexal mass. Other: No intraperitoneal free fluid. Musculoskeletal: No worrisome lytic or sclerotic osseous abnormality. IMPRESSION: 1. No acute findings in the abdomen or pelvis. Specifically, no findings to explain the patient's history of diarrhea and pain. 2. Hepatic and left renal cysts. 3. Stable 5 mm right middle lobe pulmonary nodule since 09/14/2017. No follow-up needed if patient is low-risk. Non-contrast chest CT can be considered in 12 months if patient is high-risk. This recommendation follows the consensus statement: Guidelines for Management of Incidental Pulmonary Nodules Detected on CT Images: From the Fleischner Society 2017; Radiology 2017; 284:228-243. 4.  Aortic Atherosclerois (ICD10-170.0) Electronically Signed   By: Misty Stanley M.D.   On: 02/08/2018 21:25   Dg Chest 2 View Result Date: 02/08/2018 CLINICAL DATA:  Abdominal pain, weakness, nausea, vomiting, diarrhea. EXAM: CHEST - 2 VIEW COMPARISON:  12/20/2011 FINDINGS: The heart size and mediastinal contours are within normal limits. Both lungs are clear. The visualized skeletal structures are unremarkable. IMPRESSION: No active  cardiopulmonary disease. Electronically Signed   By: Lucienne Capers M.D.   On: 02/08/2018 21:31   Ct Head Wo Contrast Result Date: 02/08/2018 CLINICAL DATA:  Head trauma, follow-up EXAM: CT HEAD WITHOUT CONTRAST TECHNIQUE: Contiguous axial images were obtained from the base of the skull through the vertex without intravenous contrast. COMPARISON:  None. FINDINGS: Brain: Patchy subcortical white matter hypodensities more so along the left parietal lobe are identified consistent with chronic microvascular ischemic disease. Mild involutional changes of the brain are noted. No large vascular territory infarction, hemorrhage, midline shift or edema. Midline fourth ventricle and basal cisterns without effacement. Vascular: Atherosclerosis of the carotid siphons bilaterally. No hyperdense vessel sign. Skull: Intact Sinuses/Orbits: Partially included calcified density projects over the right maxilla possibly representing inspissated/desiccated mucus or partial volume averaging of the floor of the maxillary sinus or osteoma. Intact orbits and globes. Bilateral cataract extractions. Other: None IMPRESSION: Chronic appearing microvascular ischemic disease of periventricular and subcortical white matter. No acute intracranial abnormality. Electronically Signed   By: Ashley Royalty M.D.   On: 02/08/2018 21:21    2220:  BUN/Cr elevated from baseline; judicious IVF given. Troponin mildly elevated but no acute STTW elevation on EKG (no old to compare) and pt denies CP. Dx and testing d/w pt and family.  Questions answered.  Verb understanding, agreeable to admit.  T/C returned from [facility] Dr. Si Raider, case discussed, including:  HPI, pertinent PM/SHx, VS/PE, dx testing, ED course and treatment:  Agreeable to admit.       Final Clinical Impressions(s) / ED Diagnoses   Final diagnoses:  None    ED Discharge Orders    None       Francine Graven, DO 02/10/18 1641

## 2018-02-08 NOTE — ED Notes (Signed)
ED TO INPATIENT HANDOFF REPORT  Name/Age/Gender Claudia Moore 85 y.o. adult  Code Status Advance Directive Documentation     Most Recent Value  Type of Advance Directive  Healthcare Power of Attorney, Living will  Pre-existing out of facility DNR order (yellow form or pink MOST form)  -  "MOST" Form in Place?  -      Home/SNF/Other Home  Chief Complaint Fall, possible infection  Level of Care/Admitting Diagnosis ED Disposition    ED Disposition Condition [place]: [place] [[id number]]  Level of Care: Telemetry [5]  Admit to tele based on following criteria: Monitor for Ischemic changes  Diagnosis: Acute gastroenteritis [[id number]]  Admitting Physician: Eston Esters  Attending Physician: Gwynne Edinger [[id number]]  Estimated length of stay: past midnight tomorrow  Certification:: I certify this patient will need inpatient services for at least 2 midnights  PT Class (Do Not Modify): Inpatient [101]  PT Acc Code (Do Not Modify): Private [1]       Medical History Past Medical History:  Diagnosis Date  . Bilateral leg edema   . Chronic ITP (idiopathic thrombocytopenia) (HCC)   . Chronic leukopenia   . Depression   . Hypertension   . Thyroid disease     Allergies Allergies  Allergen Reactions  . Pine Anaphylaxis    PIne Nuts  . Celebrex [Celecoxib] Other (See Comments)    Increase Heart Rate, Sweats and Nausea  . Zyrtec [Cetirizine] Other (See Comments)    Rapid heart rate and dizziness    IV Location/Drains/Wounds Patient Lines/Drains/Airways Status   Active Line/Drains/Airways    Name:   Placement date:   Placement time:   Site:   Days:   Peripheral IV 09/14/17 Right Antecubital   09/14/17    0930    Antecubital   147   Peripheral IV 02/08/18 Right Hand   02/08/18    2144    Hand   less than 1          Labs/Imaging Results for orders placed or performed during the hospital encounter of 02/08/18  (from the past 48 hour(s))  Lipase, blood     Status: None   Collection Time: 02/08/18  6:50 PM  Result Value Ref Range   Lipase 35 11 - 51 U/L    Comment: Performed at [facility], [place] [address] [place], [place] [address]  Comprehensive metabolic panel     Status: Abnormal   Collection Time: 02/08/18  6:50 PM  Result Value Ref Range   Sodium 131 (L) 135 - 145 mmol/L   Potassium 3.5 3.5 - 5.1 mmol/L   Chloride 100 98 - 111 mmol/L   CO2 16 (L) 22 - 32 mmol/L   Glucose, Bld 108 (H) 70 - 99 mg/dL   BUN 48 (H) 8 - 23 mg/dL   Creatinine, Ser 1.93 (H) 0.44 - 1.00 mg/dL   Calcium 8.3 (L) 8.9 - 10.3 mg/dL   Total Protein 7.2 6.5 - 8.1 g/dL   Albumin 3.5 3.5 - 5.0 g/dL   AST 48 (H) 15 - 41 U/L   ALT 42 0 - 44 U/L   Alkaline Phosphatase 35 (L) 38 - 126 U/L   Total Bilirubin 0.6 0.3 - 1.2 mg/dL   GFR calc non Af Amer 24 (L) >60 mL/min   GFR calc Af Amer 27 (L) >60 mL/min   Anion gap 15 5 - 15    Comment: Performed  at [facility], [place] [address] [place], [place] [address]  CBC     Status: Abnormal   Collection Time: 02/08/18  6:50 PM  Result Value Ref Range   WBC 4.5 4.0 - 10.5 K/uL   RBC 4.36 3.87 - 5.11 MIL/uL   Hemoglobin 13.4 12.0 - 15.0 g/dL   HCT 40.4 36.0 - 46.0 %   MCV 92.7 80.0 - 100.0 fL   MCH 30.7 26.0 - 34.0 pg   MCHC 33.2 30.0 - 36.0 g/dL   RDW 15.4 11.5 - 15.5 %   Platelets 52 (L) 150 - 400 K/uL    Comment: Immature Platelet Fraction may be clinically indicated, consider ordering this additional test [id number] REPEATED TO VERIFY SPECIMEN CHECKED FOR CLOTS PLATELET COUNT CONFIRMED BY SMEAR    nRBC 0.0 0.0 - 0.2 %    Comment: Performed at [facility], [place] [address], [place], [place] [address]  Urinalysis, Routine w reflex microscopic     Status: Abnormal   Collection Time: 02/08/18  6:50 PM  Result Value Ref Range   Color, Urine YELLOW YELLOW   APPearance HAZY (A) CLEAR   Specific Gravity, Urine 1.013  1.005 - 1.030   pH 5.0 5.0 - 8.0   Glucose, UA NEGATIVE NEGATIVE mg/dL   Hgb urine dipstick SMALL (A) NEGATIVE   Bilirubin Urine NEGATIVE NEGATIVE   Ketones, ur NEGATIVE NEGATIVE mg/dL   Protein, ur 30 (A) NEGATIVE mg/dL   Nitrite NEGATIVE NEGATIVE   Leukocytes, UA NEGATIVE NEGATIVE   RBC / HPF 0-5 0 - 5 RBC/hpf   WBC, UA 6-10 0 - 5 WBC/hpf   Bacteria, UA RARE (A) NONE SEEN   Squamous Epithelial / LPF 0-5 0 - 5   Mucus PRESENT    Hyaline Casts, UA PRESENT    Granular Casts, UA PRESENT    Non Squamous Epithelial 0-5 (A) NONE SEEN    Comment: Performed at [facility], [place] [address] [place], [place] [address]  Troponin I - Once     Status: Abnormal   Collection Time: 02/08/18  9:02 PM  Result Value Ref Range   Troponin I 0.06 (HH) <0.03 ng/mL    Comment: CRITICAL RESULT CALLED TO, READ BACK BY AND VERIFIED WITH: Samul Dada RN 2150 02/08/2018 Performed at [facility], [place] [address], [place], [place] [address]    Ct Abdomen Pelvis Wo Contrast  Result Date: 02/08/2018 CLINICAL DATA:  Watery diarrhea. Abdominal pain. EXAM: CT ABDOMEN AND PELVIS WITHOUT CONTRAST TECHNIQUE: Multidetector CT imaging of the abdomen and pelvis was performed following the standard protocol without IV contrast. COMPARISON:  09/14/2017 FINDINGS: Lower chest: 5 mm posterior right middle lobe pulmonary nodule is stable. Hepatobiliary: The liver shows diffusely decreased attenuation suggesting steatosis. 2.6 cm cyst in the lateral segment left liver is stable. Gallbladder surgically absent. No intrahepatic or extrahepatic biliary dilation. Pancreas: No focal mass lesion. No dilatation of the main duct. No intraparenchymal cyst. No peripancreatic edema. Spleen: Calcified granulomata. Adrenals/Urinary Tract: No adrenal nodule or mass. Right kidney unremarkable. Central sinus cysts in the left kidney well demonstrated on prior study with contrast material. Small low-density lesions  in the left kidney are compatible with cysts, also better demonstrated previously. No evidence for hydroureter. The urinary bladder appears normal for the degree of distention. Stomach/Bowel: Stomach is nondistended. No gastric wall thickening. No evidence of outlet obstruction. Duodenum is normally positioned as is the ligament of Treitz. No small bowel wall thickening.  No small bowel dilatation. The terminal ileum is normal. The appendix is not visualized, but there is no edema or inflammation in the region of the cecum. No gross colonic mass. No colonic wall thickening. Vascular/Lymphatic: There is abdominal aortic atherosclerosis without aneurysm. There is no gastrohepatic or hepatoduodenal ligament lymphadenopathy. No intraperitoneal or retroperitoneal lymphadenopathy. No pelvic sidewall lymphadenopathy. Reproductive: Uterus surgically absent.  There is no adnexal mass. Other: No intraperitoneal free fluid. Musculoskeletal: No worrisome lytic or sclerotic osseous abnormality. IMPRESSION: 1. No acute findings in the abdomen or pelvis. Specifically, no findings to explain the patient's history of diarrhea and pain. 2. Hepatic and left renal cysts. 3. Stable 5 mm right middle lobe pulmonary nodule since 09/14/2017. No follow-up needed if patient is low-risk. Non-contrast chest CT can be considered in 12 months if patient is high-risk. This recommendation follows the consensus statement: Guidelines for Management of Incidental Pulmonary Nodules Detected on CT Images: From the Fleischner Society 2017; Radiology 2017; 284:228-243. 4.  Aortic Atherosclerois (ICD10-170.0) Electronically Signed   By: Misty Stanley M.D.   On: 02/08/2018 21:25   Dg Chest 2 View  Result Date: 02/08/2018 CLINICAL DATA:  Abdominal pain, weakness, nausea, vomiting, diarrhea. EXAM: CHEST - 2 VIEW COMPARISON:  12/20/2011 FINDINGS: The heart size and mediastinal contours are within normal limits. Both lungs are clear. The visualized skeletal  structures are unremarkable. IMPRESSION: No active cardiopulmonary disease. Electronically Signed   By: Lucienne Capers M.D.   On: 02/08/2018 21:31   Ct Head Wo Contrast  Result Date: 02/08/2018 CLINICAL DATA:  Head trauma, follow-up EXAM: CT HEAD WITHOUT CONTRAST TECHNIQUE: Contiguous axial images were obtained from the base of the skull through the vertex without intravenous contrast. COMPARISON:  None. FINDINGS: Brain: Patchy subcortical white matter hypodensities more so along the left parietal lobe are identified consistent with chronic microvascular ischemic disease. Mild involutional changes of the brain are noted. No large vascular territory infarction, hemorrhage, midline shift or edema. Midline fourth ventricle and basal cisterns without effacement. Vascular: Atherosclerosis of the carotid siphons bilaterally. No hyperdense vessel sign. Skull: Intact Sinuses/Orbits: Partially included calcified density projects over the right maxilla possibly representing inspissated/desiccated mucus or partial volume averaging of the floor of the maxillary sinus or osteoma. Intact orbits and globes. Bilateral cataract extractions. Other: None IMPRESSION: Chronic appearing microvascular ischemic disease of periventricular and subcortical white matter. No acute intracranial abnormality. Electronically Signed   By: Ashley Royalty M.D.   On: 02/08/2018 21:21    Pending Labs Unresulted Labs (From admission, onward)    Start     Ordered   Signed and Held  CBC  (heparin)  Once,   R    Comments:  Baseline for heparin therapy IF NOT ALREADY DRAWN.  Notify MD if PLT < 100 K.    Signed and Held   Signed and Held  Creatinine, serum  (heparin)  Once,   R    Comments:  Baseline for heparin therapy IF NOT ALREADY DRAWN.    Signed and Held   Signed and Held  Troponin I - Now Then Q6H  Now then every 6 hours,   R     Signed and Held   Signed and Held  Comprehensive metabolic panel  Tomorrow morning,   R     Signed and  Held   Signed and Held  CBC  Tomorrow morning,   R     Signed and Held   Signed and Held  Urine Culture  Add-on,  R    Question:  Patient immune status  Answer:  Normal   Signed and Held   Signed and Held  Influenza panel by PCR (type A & B)  Once,   R     Signed and Held   Signed and Held  Gastrointestinal Panel by PCR , Stool  (Gastrointestinal Panel by PCR, Stool)  Once,   R     Signed and Held   Signed and Held  C difficile quick scan w PCR reflex  (C Difficile quick screen w PCR reflex panel)  Once, for 24 hours,   R     Signed and Held          Vitals/Pain Today's Vitals   02/08/18 1753 02/08/18 1755 02/08/18 2134  BP: 105/77  (!) 111/52  Pulse: 96  85  Resp: 18  (!) 24  Temp: 99 F (37.2 C)    TempSrc: Oral    SpO2: 95%  96%  Weight:  81.1 kg   Height:  5\' 2"  (1.575 m)   PainSc:  0-No pain     Isolation Precautions No active isolations  Medications Medications  0.9 %  sodium chloride infusion ( Intravenous New Bag/Given (Non-Interop) 02/08/18 2146)  ondansetron (ZOFRAN) injection 4 mg (has no administration in time range)  sodium chloride 0.9 % bolus 500 mL (500 mLs Intravenous New Bag/Given (Non-Interop) 02/08/18 2145)    Mobility walks with person assist

## 2018-02-09 ENCOUNTER — Inpatient Hospital Stay (HOSPITAL_COMMUNITY): Payer: Medicare Other

## 2018-02-09 ENCOUNTER — Other Ambulatory Visit: Payer: Self-pay

## 2018-02-09 DIAGNOSIS — I351 Nonrheumatic aortic (valve) insufficiency: Secondary | ICD-10-CM

## 2018-02-09 LAB — INFLUENZA PANEL BY PCR (TYPE A & B)
Influenza A By PCR: NEGATIVE
Influenza B By PCR: NEGATIVE

## 2018-02-09 LAB — ECHOCARDIOGRAM COMPLETE
Height: 62 in
Weight: 2860.69 oz

## 2018-02-09 LAB — COMPREHENSIVE METABOLIC PANEL
ALT: 39 U/L (ref 0–44)
ANION GAP: 13 (ref 5–15)
AST: 45 U/L — ABNORMAL HIGH (ref 15–41)
Albumin: 3.3 g/dL — ABNORMAL LOW (ref 3.5–5.0)
Alkaline Phosphatase: 31 U/L — ABNORMAL LOW (ref 38–126)
BUN: 53 mg/dL — ABNORMAL HIGH (ref 8–23)
CO2: 19 mmol/L — ABNORMAL LOW (ref 22–32)
Calcium: 7.9 mg/dL — ABNORMAL LOW (ref 8.9–10.3)
Chloride: 103 mmol/L (ref 98–111)
Creatinine, Ser: 1.85 mg/dL — ABNORMAL HIGH (ref 0.44–1.00)
GFR calc Af Amer: 29 mL/min — ABNORMAL LOW (ref >60)
GFR calc non Af Amer: 25 mL/min — ABNORMAL LOW (ref >60)
Glucose, Bld: 110 mg/dL — ABNORMAL HIGH (ref 70–99)
Potassium: 3.6 mmol/L (ref 3.5–5.1)
Sodium: 135 mmol/L (ref 135–145)
Total Bilirubin: 0.6 mg/dL (ref 0.3–1.2)
Total Protein: 6.7 g/dL (ref 6.5–8.1)

## 2018-02-09 LAB — CBC
HCT: 38.4 % (ref 36.0–46.0)
Hemoglobin: 12.4 g/dL (ref 12.0–15.0)
MCH: 29.9 pg (ref 26.0–34.0)
MCHC: 32.3 g/dL (ref 30.0–36.0)
MCV: 92.5 fL (ref 80.0–100.0)
Platelets: 47 10*3/uL — ABNORMAL LOW (ref 150–400)
RBC: 4.15 MIL/uL (ref 3.87–5.11)
RDW: 15.4 % (ref 11.5–15.5)
WBC: 3.7 10*3/uL — ABNORMAL LOW (ref 4.0–10.5)
nRBC: 0 % (ref 0.0–0.2)

## 2018-02-09 LAB — C DIFFICILE QUICK SCREEN W PCR REFLEX
C DIFFICLE (CDIFF) ANTIGEN: NEGATIVE
C Diff interpretation: NOT DETECTED
C Diff toxin: NEGATIVE

## 2018-02-09 LAB — TROPONIN I
Troponin I: 0.07 ng/mL (ref <0.03)
Troponin I: 0.17 ng/mL (ref <0.03)

## 2018-02-09 MED ORDER — SODIUM CHLORIDE 0.9 % IV SOLN
500.0000 mg | INTRAVENOUS | Status: DC
  Administered 2018-02-09: 500 mg via INTRAVENOUS
  Filled 2018-02-09 (×2): qty 500

## 2018-02-09 MED ORDER — ONDANSETRON HCL 4 MG/2ML IJ SOLN
4.0000 mg | Freq: Three times a day (TID) | INTRAMUSCULAR | Status: DC | PRN
  Administered 2018-02-09 – 2018-02-10 (×4): 4 mg via INTRAVENOUS
  Filled 2018-02-09 (×4): qty 2

## 2018-02-09 MED ORDER — ONDANSETRON 4 MG PO TBDP: 4.0000 mg | ORAL_TABLET | Freq: Three times a day (TID) | ORAL | Status: DC | PRN

## 2018-02-09 MED ORDER — SODIUM CHLORIDE 0.9 % IV SOLN
INTRAVENOUS | Status: DC
  Administered 2018-02-09 – 2018-02-10 (×5): via INTRAVENOUS

## 2018-02-09 MED ORDER — HEPARIN SODIUM (PORCINE) 5000 UNIT/ML IJ SOLN
5000.0000 [IU] | Freq: Three times a day (TID) | INTRAMUSCULAR | Status: DC
  Filled 2018-02-09: qty 1

## 2018-02-09 MED ORDER — LEVOTHYROXINE SODIUM 100 MCG PO TABS
100.0000 ug | ORAL_TABLET | Freq: Every day | ORAL | Status: DC
  Administered 2018-02-09 – 2018-02-11 (×3): 100 ug via ORAL
  Filled 2018-02-09 (×3): qty 1

## 2018-02-09 MED ORDER — BOOST / RESOURCE BREEZE PO LIQD CUSTOM
1.0000 | Freq: Three times a day (TID) | ORAL | Status: DC
  Administered 2018-02-09 – 2018-02-11 (×4): 1 via ORAL

## 2018-02-09 MED ORDER — ESCITALOPRAM OXALATE 10 MG PO TABS
10.0000 mg | ORAL_TABLET | Freq: Every day | ORAL | Status: DC
  Administered 2018-02-09 – 2018-02-11 (×3): 10 mg via ORAL
  Filled 2018-02-09 (×3): qty 1

## 2018-02-09 MED ORDER — ELTROMBOPAG OLAMINE 50 MG PO TABS
50.0000 mg | ORAL_TABLET | Freq: Every day | ORAL | Status: DC
  Administered 2018-02-09 – 2018-02-11 (×3): 50 mg via ORAL

## 2018-02-09 MED ORDER — ATENOLOL 25 MG PO TABS
12.5000 mg | ORAL_TABLET | Freq: Every day | ORAL | Status: DC
  Administered 2018-02-11: 12.5 mg via ORAL
  Filled 2018-02-09 (×2): qty 1

## 2018-02-09 MED ORDER — HYDRALAZINE HCL 20 MG/ML IJ SOLN: 10.0000 mg | Freq: Four times a day (QID) | INTRAMUSCULAR | Status: DC | PRN

## 2018-02-09 NOTE — Progress Notes (Signed)
  Echocardiogram 2D Echocardiogram has been performed.  Jaileigh Weimer G Kashae Carstens 02/09/2018, 3:03 PM

## 2018-02-09 NOTE — Plan of Care (Signed)
Patient with approximately 5 stools on 7 a to 7 p shift, small amounts each time of stool that appeared to be mucus, not watery.  C diff came back negative, GI panel still pending.  Patient also noted to have frequent belching that is improved with Zofran however patient denies nausea.  Very difficult for patient to explain how she feels.  Does state her headache has eased since receiving IV fluids.  Husband and daughter at bedside.

## 2018-02-09 NOTE — Progress Notes (Signed)
Patient Demographics:    Claudia Moore, is a 85 y.o. adult, DOB - 1933-11-17, TXM:[id number]  Admit date - 02/08/2018   Admitting Physician Gwynne Edinger, MD  Outpatient Primary MD for the patient is [name], [name], MD  LOS - 1   Chief Complaint  Patient presents with  . Fall  . Diarrhea        Subjective:    Claudia Moore today has no fevers, has nausea, but no emesis,  No chest pain,  Husband at bedside,  Diarrhea persist,    Assessment  & Plan :    Active Problems:   Acute gastroenteritis   AKI (acute kidney injury) ([place])   Hyponatremia   Elevated troponin   Elevated AST (SGOT)  Brief Summary 85 y.o. adult with medical history significant for hypothyroid, htn, ITP, chronic leukopenia (followed by heme) admitted on 02/08/2018 with persistent watery diarrhea resulting in dehydration and acute kidney injury  Plan 1)AKI----acute kidney injury on CKD stage - III due to poor oral intake and persistent diarrhea compounded by lisinopril and Lasix use, creatinine on admission= 1.93  ,   baseline creatinine =  0.9  , creatinine is now=  1.85    , renally adjust medications, avoid nephrotoxic agents / dehydration /hypotension------- continue IV fluids, oral fluids as tolerated as well, continue to hold lisinopril and Lasix  2)Elevated troponin-in the setting of AKI, no chest pains, no ACS type symptoms, EKG reassuring, echocardiogram without regional wall motion normalities, EF is preserved at above 55% to 60 %--- continue atenolol,  3)Gastroenteritis--- awaiting stool studies, hydrate, advance diet to full liquid, PRN antiemetics  Disposition/Need for in-Hospital Stay- patient unable to be discharged at this time due to worsening renal function requiring IV fluids in the setting of poor oral intake and diarrhea  Code Status : DNR  Family Communication:   Husband at bedside   Disposition Plan  :  TBD  Consults  :  na  DVT Prophylaxis  :   - Heparin    Lab Results  Component Value Date   PLT 47 (L) 02/09/2018    Inpatient Medications  Scheduled Meds: . atenolol  12.5 mg Oral Daily  . eltrombopag  50 mg Oral Daily  . escitalopram  10 mg Oral Daily  . feeding supplement  1 Container Oral TID BM  . heparin  5,000 Units Subcutaneous Q8H  . levothyroxine  100 mcg Oral QAC breakfast   Continuous Infusions: . sodium chloride 125 mL/hr at 02/09/18 1801  . azithromycin 500 mg (02/09/18 0050)   PRN Meds:.ondansetron (ZOFRAN) IV, ondansetron    Anti-infectives (From admission, onward)   Start     Dose/Rate Route Frequency Ordered Stop   02/09/18 0030  azithromycin (ZITHROMAX) 500 mg in sodium chloride 0.9 % 250 mL IVPB     500 mg 250 mL/hr over 60 Minutes Intravenous Every 24 hours 02/09/18 0003 02/12/18 0029        Objective:   Vitals:   02/09/18 0004 02/09/18 0511 02/09/18 1050 02/09/18 1508  BP: (!) 107/52 (!) 92/56 (!) 88/50 (!) 94/55  Pulse: 90 89 86 84  Resp: 20 18  18   Temp: 98.9 F (37.2 C) 98 F (36.7 C)  99.8 F (37.7 C)  TempSrc: Oral   Oral  SpO2: 95% 97% 94% 94%  Weight:      Height:        Wt Readings from Last 3 Encounters:  02/08/18 81.1 kg  01/29/18 81.1 kg  01/10/18 81.8 kg     Intake/Output Summary (Last 24 hours) at 02/09/2018 1822 Last data filed at 02/09/2018 1803 Gross per 24 hour  Intake 1686.04 ml  Output 700 ml  Net 986.04 ml    Physical Exam Patient is examined daily including today on 02/09/2018 , exams remain the same as of yesterday except that has changed   Gen:- Awake Alert,  , no apparent distress  HEENT:- [place].AT, No sclera icterus Neck-Supple Neck,No JVD,.  Lungs-  CTAB , fair symmetrical air movement CV- S1, S2 normal, regular  Abd-  +ve B.Sounds, Abd Soft, No tenderness,    Extremity/Skin:- No  edema, pedal pulses present  Psych-affect is appropriate, oriented x3 Neuro-no new focal deficits, no tremors    Data Review:   Micro Results Recent Results (from the past 240 hour(s))  C difficile quick scan w PCR reflex     Status: None   Collection Time: 02/09/18 12:04 AM  Result Value Ref Range Status   C Diff antigen NEGATIVE NEGATIVE Final   C Diff toxin NEGATIVE NEGATIVE Final   C Diff interpretation No C. difficile detected.  Final    Comment: Performed at [place], [place] [address] [place], [place] [address]    Radiology Reports Ct Abdomen Pelvis Wo Contrast  Result Date: 02/08/2018 CLINICAL DATA:  Watery diarrhea. Abdominal pain. EXAM: CT ABDOMEN AND PELVIS WITHOUT CONTRAST TECHNIQUE: Multidetector CT imaging of the abdomen and pelvis was performed following the standard protocol without IV contrast. COMPARISON:  09/14/2017 FINDINGS: Lower chest: 5 mm posterior right middle lobe pulmonary nodule is stable. Hepatobiliary: The liver shows diffusely decreased attenuation suggesting steatosis. 2.6 cm cyst in the lateral segment left liver is stable. Gallbladder surgically absent. No intrahepatic or extrahepatic biliary dilation. Pancreas: No focal mass lesion. No dilatation of the main duct. No intraparenchymal cyst. No peripancreatic edema. Spleen: Calcified granulomata. Adrenals/Urinary Tract: No adrenal nodule or mass. Right kidney unremarkable. Central sinus cysts in the left kidney well demonstrated on prior study with contrast material. Small low-density lesions in the left kidney are compatible with cysts, also better demonstrated previously. No evidence for hydroureter. The urinary bladder appears normal for the degree of distention. Stomach/Bowel: Stomach is nondistended. No gastric wall thickening. No evidence of outlet obstruction. Duodenum is normally positioned as is the ligament of Treitz. No small bowel wall thickening. No small bowel dilatation. The terminal ileum is normal. The appendix is not visualized, but there is no edema or inflammation in the region of the  cecum. No gross colonic mass. No colonic wall thickening. Vascular/Lymphatic: There is abdominal aortic atherosclerosis without aneurysm. There is no gastrohepatic or hepatoduodenal ligament lymphadenopathy. No intraperitoneal or retroperitoneal lymphadenopathy. No pelvic sidewall lymphadenopathy. Reproductive: Uterus surgically absent.  There is no adnexal mass. Other: No intraperitoneal free fluid. Musculoskeletal: No worrisome lytic or sclerotic osseous abnormality. IMPRESSION: 1. No acute findings in the abdomen or pelvis. Specifically, no findings to explain the patient's history of diarrhea and pain. 2. Hepatic and left renal cysts. 3. Stable 5 mm right middle lobe pulmonary nodule since 09/14/2017. No follow-up needed if patient is low-risk. Non-contrast chest CT can be considered in 12 months if patient is high-risk. This recommendation follows the consensus statement: Guidelines for Management of  Incidental Pulmonary Nodules Detected on CT Images: From the Fleischner Society 2017; Radiology 2017; 284:228-243. 4.  Aortic Atherosclerois (ICD10-170.0) Electronically Signed   By: Misty Stanley M.D.   On: 02/08/2018 21:25   Dg Chest 2 View  Result Date: 02/08/2018 CLINICAL DATA:  Abdominal pain, weakness, nausea, vomiting, diarrhea. EXAM: CHEST - 2 VIEW COMPARISON:  12/20/2011 FINDINGS: The heart size and mediastinal contours are within normal limits. Both lungs are clear. The visualized skeletal structures are unremarkable. IMPRESSION: No active cardiopulmonary disease. Electronically Signed   By: Lucienne Capers M.D.   On: 02/08/2018 21:31   Ct Head Wo Contrast  Result Date: 02/08/2018 CLINICAL DATA:  Head trauma, follow-up EXAM: CT HEAD WITHOUT CONTRAST TECHNIQUE: Contiguous axial images were obtained from the base of the skull through the vertex without intravenous contrast. COMPARISON:  None. FINDINGS: Brain: Patchy subcortical white matter hypodensities more so along the left parietal lobe are  identified consistent with chronic microvascular ischemic disease. Mild involutional changes of the brain are noted. No large vascular territory infarction, hemorrhage, midline shift or edema. Midline fourth ventricle and basal cisterns without effacement. Vascular: Atherosclerosis of the carotid siphons bilaterally. No hyperdense vessel sign. Skull: Intact Sinuses/Orbits: Partially included calcified density projects over the right maxilla possibly representing inspissated/desiccated mucus or partial volume averaging of the floor of the maxillary sinus or osteoma. Intact orbits and globes. Bilateral cataract extractions. Other: None IMPRESSION: Chronic appearing microvascular ischemic disease of periventricular and subcortical white matter. No acute intracranial abnormality. Electronically Signed   By: Ashley Royalty M.D.   On: 02/08/2018 21:21     CBC Recent Labs  Lab 02/08/18 1850 02/09/18 0611  WBC 4.5 3.7*  HGB 13.4 12.4  HCT 40.4 38.4  PLT 52* 47*  MCV 92.7 92.5  MCH 30.7 29.9  MCHC 33.2 32.3  RDW 15.4 15.4    Chemistries  Recent Labs  Lab 02/08/18 1850 02/09/18 0611  NA 131* 135  K 3.5 3.6  CL 100 103  CO2 16* 19*  GLUCOSE 108* 110*  BUN 48* 53*  CREATININE 1.93* 1.85*  CALCIUM 8.3* 7.9*  AST 48* 45*  ALT 42 39  ALKPHOS 35* 31*  BILITOT 0.6 0.6   ------------------------------------------------------------------------------------------------------------------ No results for input(s): CHOL, HDL, LDLCALC, TRIG, CHOLHDL, LDLDIRECT in the last 72 hours.  No results found for: HGBA1C ------------------------------------------------------------------------------------------------------------------ No results for input(s): TSH, T4TOTAL, T3FREE, THYROIDAB in the last 72 hours.  Invalid input(s): FREET3 ------------------------------------------------------------------------------------------------------------------ No results for input(s): VITAMINB12, FOLATE, FERRITIN, TIBC,  IRON, RETICCTPCT in the last 72 hours.  Coagulation profile No results for input(s): INR, PROTIME in the last 168 hours.  No results for input(s): DDIMER in the last 72 hours.  Cardiac Enzymes Recent Labs  Lab 02/08/18 2102 02/09/18 0021 02/09/18 0611  TROPONINI 0.06* 0.07* 0.17*   ------------------------------------------------------------------------------------------------------------------ No results found for: BNP   Roxan Hockey M.D on 02/09/2018 at 6:22 PM  Go to [email or web address] - for contact info  [facility] - Office  [phone]

## 2018-02-10 LAB — GASTROINTESTINAL PANEL BY PCR, STOOL (REPLACES STOOL CULTURE)

## 2018-02-10 LAB — COMPREHENSIVE METABOLIC PANEL
ALT: 41 U/L (ref 0–44)
AST: 56 U/L — ABNORMAL HIGH (ref 15–41)
Albumin: 2.8 g/dL — ABNORMAL LOW (ref 3.5–5.0)
Alkaline Phosphatase: 34 U/L — ABNORMAL LOW (ref 38–126)
Anion gap: 10 (ref 5–15)
BUN: 45 mg/dL — ABNORMAL HIGH (ref 8–23)
CHLORIDE: 108 mmol/L (ref 98–111)
CO2: 17 mmol/L — ABNORMAL LOW (ref 22–32)
Calcium: 7.5 mg/dL — ABNORMAL LOW (ref 8.9–10.3)
Creatinine, Ser: 1.69 mg/dL — ABNORMAL HIGH (ref 0.44–1.00)
GFR calc Af Amer: 32 mL/min — ABNORMAL LOW (ref >60)
GFR calc non Af Amer: 28 mL/min — ABNORMAL LOW (ref >60)
Glucose, Bld: 123 mg/dL — ABNORMAL HIGH (ref 70–99)
Potassium: 3 mmol/L — ABNORMAL LOW (ref 3.5–5.1)
Sodium: 135 mmol/L (ref 135–145)
Total Bilirubin: 0.6 mg/dL (ref 0.3–1.2)
Total Protein: 5.7 g/dL — ABNORMAL LOW (ref 6.5–8.1)

## 2018-02-10 LAB — CBC
HCT: 34.6 % — ABNORMAL LOW (ref 36.0–46.0)
Hemoglobin: 11.4 g/dL — ABNORMAL LOW (ref 12.0–15.0)
MCH: 31 pg (ref 26.0–34.0)
MCHC: 32.9 g/dL (ref 30.0–36.0)
MCV: 94 fL (ref 80.0–100.0)
Platelets: 50 10*3/uL — ABNORMAL LOW (ref 150–400)
RBC: 3.68 MIL/uL — ABNORMAL LOW (ref 3.87–5.11)
RDW: 15.3 % (ref 11.5–15.5)
WBC: 3.3 10*3/uL — ABNORMAL LOW (ref 4.0–10.5)
nRBC: 0 % (ref 0.0–0.2)

## 2018-02-10 MED ORDER — DOXYCYCLINE HYCLATE 100 MG PO TABS
100.0000 mg | ORAL_TABLET | Freq: Two times a day (BID) | ORAL | Status: DC
  Administered 2018-02-10 – 2018-02-11 (×3): 100 mg via ORAL
  Filled 2018-02-10 (×3): qty 1

## 2018-02-10 MED ORDER — HYDROCORTISONE 1 % EX CREA
TOPICAL_CREAM | Freq: Three times a day (TID) | CUTANEOUS | Status: DC | PRN
  Filled 2018-02-10: qty 28

## 2018-02-10 MED ORDER — POTASSIUM CHLORIDE CRYS ER 20 MEQ PO TBCR
40.0000 meq | EXTENDED_RELEASE_TABLET | Freq: Once | ORAL | Status: DC
  Filled 2018-02-10: qty 2

## 2018-02-10 MED ORDER — POTASSIUM CHLORIDE CRYS ER 20 MEQ PO TBCR
40.0000 meq | EXTENDED_RELEASE_TABLET | Freq: Once | ORAL | Status: AC
  Administered 2018-02-10: 40 meq via ORAL
  Filled 2018-02-10: qty 2

## 2018-02-10 NOTE — Progress Notes (Signed)
Patient negative for C diff, GI panel negative.  Enteric precautions discontinued as per protocol.

## 2018-02-10 NOTE — Progress Notes (Signed)
Patient Demographics:    Claudia Moore, is a 85 y.o. adult, DOB - 02-05-1933, ZYY:[id number]  Admit date - 02/08/2018   Admitting Physician Gwynne Edinger, MD  Outpatient Primary MD for the patient is Lujean Amel, MD  LOS - 2   Chief Complaint  Patient presents with  . Fall  . Diarrhea        Subjective:    Claudia Moore today has no fevers, has nausea, but no emesis,  No chest pain,  Husband and daughter at bedside, complains of foul-smelling left-sided facial wound, diarrhea less frequent with less volume  Assessment  & Plan :    Active Problems:   Acute gastroenteritis   AKI (acute kidney injury) ([place])   Hyponatremia   Elevated troponin   Elevated AST (SGOT)  Brief Summary 85 y.o. adult with medical history significant for hypothyroid, htn, ITP, chronic leukopenia (followed by heme) admitted on 02/08/2018 with persistent watery diarrhea resulting in dehydration and acute kidney injury  Plan 1)AKI----acute kidney injury on CKD stage - III due to poor oral intake and persistent diarrhea compounded by lisinopril and Lasix use, creatinine on admission= 1.93  ,   baseline creatinine =  0.9  , creatinine is now=  1.69    , renally adjust medications, avoid nephrotoxic agents / dehydration /hypotension------- continue IV fluids, oral fluids as tolerated as well, continue to hold lisinopril and Lasix  2)Elevated troponin-in the setting of AKI, no chest pains, no ACS type symptoms, EKG reassuring, echocardiogram without regional wall motion normalities, EF is preserved at above 55% to 60 %--- continue atenolol,  3)Gastroenteritis--- volume and frequency of stools is reduced, C. difficile and GI panel negative,, hydrate, advance diet, PRN antiemetics, may use Imodium as needed  4) left-sided facial cellulitis and infection with purulent drainage--- see photo below, treat empirically with doxycycline 100  mg twice daily to cover for possible MRSA pending wound culture results  Disposition/Need for in-Hospital Stay- patient unable to be discharged at this time due to worsening renal function requiring IV fluids in the setting of poor oral intake and diarrhea  Code Status : DNR  Family Communication:   Husband at bedside   Disposition Plan  : TBD -get PT eval   Consults  :  na  DVT Prophylaxis  :   - Heparin    Lab Results  Component Value Date   PLT 50 (L) 02/10/2018    Inpatient Medications  Scheduled Meds: . atenolol  12.5 mg Oral Daily  . doxycycline  100 mg Oral Q12H  . eltrombopag  50 mg Oral Daily  . escitalopram  10 mg Oral Daily  . feeding supplement  1 Container Oral TID BM  . heparin  5,000 Units Subcutaneous Q8H  . levothyroxine  100 mcg Oral QAC breakfast  . potassium chloride  40 mEq Oral Once   Continuous Infusions: . sodium chloride 125 mL/hr at 02/10/18 1013   PRN Meds:.ondansetron (ZOFRAN) IV, ondansetron    Anti-infectives (From admission, onward)   Start     Dose/Rate Route Frequency Ordered Stop   02/10/18 1100  doxycycline (VIBRA-TABS) tablet 100 mg     100 mg Oral Every 12 hours 02/10/18 1022 02/17/18 0959   02/09/18 0030  azithromycin (  ZITHROMAX) 500 mg in sodium chloride 0.9 % 250 mL IVPB  Status:  Discontinued     500 mg 250 mL/hr over 60 Minutes Intravenous Every 24 hours 02/09/18 0003 02/09/18 1829        Objective:   Vitals:   02/09/18 1508 02/09/18 2054 02/09/18 2100 02/10/18 0531  BP: (!) 94/55 (!) 88/49 (!) 96/50 (!) 96/57  Pulse: 84 87    Resp: 18 20  20   Temp: 99.8 F (37.7 C) 99.6 F (37.6 C)  97.6 F (36.4 C)  TempSrc: Oral Oral  Oral  SpO2: 94% 93%  95%  Weight:      Height:        Wt Readings from Last 3 Encounters:  02/08/18 81.1 kg  01/29/18 81.1 kg  01/10/18 81.8 kg     Intake/Output Summary (Last 24 hours) at 02/10/2018 1520 Last data filed at 02/10/2018 1434 Gross per 24 hour  Intake 2336.93 ml  Output  1900 ml  Net 436.93 ml    Physical Exam Patient is examined daily including today on 02/10/2018 , exams remain the same as of yesterday except that has changed   Gen:- Awake Alert,  , no apparent distress  HEENT:- [place].AT, No sclera icterus Neck-Supple Neck,No JVD,.  Lungs-  CTAB , fair symmetrical air movement CV- S1, S2 normal, regular  Abd-  +ve B.Sounds, Abd Soft, No tenderness,    Extremity:- No  edema, pedal pulses present  Psych-affect is appropriate, oriented x3 Neuro-no new focal deficits, no tremors Skin--left facial lesion with foul-smelling purulent drainage,  culture obtained, please see photo below Media Information   Document Information   Photos    02/10/2018 10:17  Attached To:  Hospital Encounter on 02/08/18  Source Information   Roxan Hockey, MD  [facility] Telemetry      Data Review:   Micro Results Recent Results (from the past 240 hour(s))  Gastrointestinal Panel by PCR , Stool     Status: None   Collection Time: 02/09/18 12:04 AM  Result Value Ref Range Status   Campylobacter species NOT DETECTED NOT DETECTED Final   Plesimonas shigelloides NOT DETECTED NOT DETECTED Final   Salmonella species NOT DETECTED NOT DETECTED Final   Yersinia enterocolitica NOT DETECTED NOT DETECTED Final   Vibrio species NOT DETECTED NOT DETECTED Final   Vibrio cholerae NOT DETECTED NOT DETECTED Final   Enteroaggregative E coli (EAEC) NOT DETECTED NOT DETECTED Final   Enteropathogenic E coli (EPEC) NOT DETECTED NOT DETECTED Final   Enterotoxigenic E coli (ETEC) NOT DETECTED NOT DETECTED Final   Shiga like toxin producing E coli (STEC) NOT DETECTED NOT DETECTED Final   Shigella/Enteroinvasive E coli (EIEC) NOT DETECTED NOT DETECTED Final   Cryptosporidium NOT DETECTED NOT DETECTED Final   Cyclospora cayetanensis NOT DETECTED NOT DETECTED Final   Entamoeba histolytica NOT DETECTED NOT DETECTED Final   Giardia lamblia NOT DETECTED NOT DETECTED Final   Adenovirus  F40/41 NOT DETECTED NOT DETECTED Final   Astrovirus NOT DETECTED NOT DETECTED Final   Norovirus GI/GII NOT DETECTED NOT DETECTED Final   Rotavirus A NOT DETECTED NOT DETECTED Final   Sapovirus (I, II, IV, and V) NOT DETECTED NOT DETECTED Final    Comment: Performed at [facility], [address] [place], [place] [address]  C difficile quick scan w PCR reflex     Status: None   Collection Time: 02/09/18 12:04 AM  Result Value Ref Range Status   C Diff antigen NEGATIVE NEGATIVE Final  C Diff toxin NEGATIVE NEGATIVE Final   C Diff interpretation No C. difficile detected.  Final    Comment: Performed at [facility], [place] [address], [place], [place] [address]    Radiology Reports Ct Abdomen Pelvis Wo Contrast  Result Date: 02/08/2018 CLINICAL DATA:  Watery diarrhea. Abdominal pain. EXAM: CT ABDOMEN AND PELVIS WITHOUT CONTRAST TECHNIQUE: Multidetector CT imaging of the abdomen and pelvis was performed following the standard protocol without IV contrast. COMPARISON:  09/14/2017 FINDINGS: Lower chest: 5 mm posterior right middle lobe pulmonary nodule is stable. Hepatobiliary: The liver shows diffusely decreased attenuation suggesting steatosis. 2.6 cm cyst in the lateral segment left liver is stable. Gallbladder surgically absent. No intrahepatic or extrahepatic biliary dilation. Pancreas: No focal mass lesion. No dilatation of the main duct. No intraparenchymal cyst. No peripancreatic edema. Spleen: Calcified granulomata. Adrenals/Urinary Tract: No adrenal nodule or mass. Right kidney unremarkable. Central sinus cysts in the left kidney well demonstrated on prior study with contrast material. Small low-density lesions in the left kidney are compatible with cysts, also better demonstrated previously. No evidence for hydroureter. The urinary bladder appears normal for the degree of distention. Stomach/Bowel: Stomach is nondistended. No gastric wall thickening. No evidence of  outlet obstruction. Duodenum is normally positioned as is the ligament of Treitz. No small bowel wall thickening. No small bowel dilatation. The terminal ileum is normal. The appendix is not visualized, but there is no edema or inflammation in the region of the cecum. No gross colonic mass. No colonic wall thickening. Vascular/Lymphatic: There is abdominal aortic atherosclerosis without aneurysm. There is no gastrohepatic or hepatoduodenal ligament lymphadenopathy. No intraperitoneal or retroperitoneal lymphadenopathy. No pelvic sidewall lymphadenopathy. Reproductive: Uterus surgically absent.  There is no adnexal mass. Other: No intraperitoneal free fluid. Musculoskeletal: No worrisome lytic or sclerotic osseous abnormality. IMPRESSION: 1. No acute findings in the abdomen or pelvis. Specifically, no findings to explain the patient's history of diarrhea and pain. 2. Hepatic and left renal cysts. 3. Stable 5 mm right middle lobe pulmonary nodule since 09/14/2017. No follow-up needed if patient is low-risk. Non-contrast chest CT can be considered in 12 months if patient is high-risk. This recommendation follows the consensus statement: Guidelines for Management of Incidental Pulmonary Nodules Detected on CT Images: From the Fleischner Society 2017; Radiology 2017; 284:228-243. 4.  Aortic Atherosclerois (ICD10-170.0) Electronically Signed   By: Misty Stanley M.D.   On: 02/08/2018 21:25   Dg Chest 2 View  Result Date: 02/08/2018 CLINICAL DATA:  Abdominal pain, weakness, nausea, vomiting, diarrhea. EXAM: CHEST - 2 VIEW COMPARISON:  12/20/2011 FINDINGS: The heart size and mediastinal contours are within normal limits. Both lungs are clear. The visualized skeletal structures are unremarkable. IMPRESSION: No active cardiopulmonary disease. Electronically Signed   By: Lucienne Capers M.D.   On: 02/08/2018 21:31   Ct Head Wo Contrast  Result Date: 02/08/2018 CLINICAL DATA:  Head trauma, follow-up EXAM: CT HEAD  WITHOUT CONTRAST TECHNIQUE: Contiguous axial images were obtained from the base of the skull through the vertex without intravenous contrast. COMPARISON:  None. FINDINGS: Brain: Patchy subcortical white matter hypodensities more so along the left parietal lobe are identified consistent with chronic microvascular ischemic disease. Mild involutional changes of the brain are noted. No large vascular territory infarction, hemorrhage, midline shift or edema. Midline fourth ventricle and basal cisterns without effacement. Vascular: Atherosclerosis of the carotid siphons bilaterally. No hyperdense vessel sign. Skull: Intact Sinuses/Orbits: Partially included calcified density projects over the right maxilla possibly representing inspissated/desiccated mucus or  partial volume averaging of the floor of the maxillary sinus or osteoma. Intact orbits and globes. Bilateral cataract extractions. Other: None IMPRESSION: Chronic appearing microvascular ischemic disease of periventricular and subcortical white matter. No acute intracranial abnormality. Electronically Signed   By: Ashley Royalty M.D.   On: 02/08/2018 21:21     CBC Recent Labs  Lab 02/08/18 1850 02/09/18 0611 02/10/18 0519  WBC 4.5 3.7* 3.3*  HGB 13.4 12.4 11.4*  HCT 40.4 38.4 34.6*  PLT 52* 47* 50*  MCV 92.7 92.5 94.0  MCH 30.7 29.9 31.0  MCHC 33.2 32.3 32.9  RDW 15.4 15.4 15.3    Chemistries  Recent Labs  Lab 02/08/18 1850 02/09/18 0611 02/10/18 0519  NA 131* 135 135  K 3.5 3.6 3.0*  CL 100 103 108  CO2 16* 19* 17*  GLUCOSE 108* 110* 123*  BUN 48* 53* 45*  CREATININE 1.93* 1.85* 1.69*  CALCIUM 8.3* 7.9* 7.5*  AST 48* 45* 56*  ALT 42 39 41  ALKPHOS 35* 31* 34*  BILITOT 0.6 0.6 0.6   Cardiac Enzymes Recent Labs  Lab 02/08/18 2102 02/09/18 0021 02/09/18 0611  TROPONINI 0.06* 0.07* 0.17*   ------------------------------------------------------------------------------------------------------------------ No results found for:  BNP   Roxan Hockey M.D on 02/10/2018 at 3:20 PM  Go to [email or web address] - for contact info  [facility] - Office  [phone]

## 2018-02-10 NOTE — Plan of Care (Signed)
Patient continues with diarrhea, 5-6 small stools on 7 a to 7 p shift.  Tolerating some sips of clear liquids and bites of sherbert only.  Denies abdominal pain or nausea.  Daughter and husband at bedside.

## 2018-02-11 LAB — BASIC METABOLIC PANEL
ANION GAP: 8 (ref 5–15)
BUN: 32 mg/dL — ABNORMAL HIGH (ref 8–23)
CO2: 18 mmol/L — ABNORMAL LOW (ref 22–32)
Calcium: 7.7 mg/dL — ABNORMAL LOW (ref 8.9–10.3)
Chloride: 115 mmol/L — ABNORMAL HIGH (ref 98–111)
Creatinine, Ser: 1.43 mg/dL — ABNORMAL HIGH (ref 0.44–1.00)
GFR calc Af Amer: 39 mL/min — ABNORMAL LOW (ref >60)
GFR calc non Af Amer: 34 mL/min — ABNORMAL LOW (ref >60)
Glucose, Bld: 107 mg/dL — ABNORMAL HIGH (ref 70–99)
Potassium: 3.8 mmol/L (ref 3.5–5.1)
SODIUM: 141 mmol/L (ref 135–145)

## 2018-02-11 MED ORDER — ONDANSETRON 4 MG PO TBDP: 4.0000 mg | ORAL_TABLET | Freq: Three times a day (TID) | ORAL | 0 refills | Status: DC | PRN

## 2018-02-11 MED ORDER — LOPERAMIDE HCL 2 MG PO CAPS
2.0000 mg | ORAL_CAPSULE | Freq: Once | ORAL | Status: AC
  Administered 2018-02-11: 2 mg via ORAL
  Filled 2018-02-11: qty 1

## 2018-02-11 MED ORDER — LISINOPRIL 20 MG PO TABS: 20.0000 mg | ORAL_TABLET | Freq: Every day | ORAL | 1 refills | Status: DC

## 2018-02-11 MED ORDER — DOXYCYCLINE HYCLATE 100 MG PO TABS: 100.0000 mg | ORAL_TABLET | Freq: Two times a day (BID) | ORAL | 0 refills | Status: DC

## 2018-02-11 MED ORDER — FUROSEMIDE 40 MG PO TABS: 40.0000 mg | ORAL_TABLET | Freq: Every day | ORAL | 0 refills | Status: DC | PRN

## 2018-02-11 MED ORDER — LOPERAMIDE HCL 2 MG PO TABS: 2.0000 mg | ORAL_TABLET | Freq: Three times a day (TID) | ORAL | 0 refills | Status: DC | PRN

## 2018-02-11 MED ORDER — ADULT MULTIVITAMIN W/MINERALS CH: 1.0000 | ORAL_TABLET | Freq: Every day | ORAL | Status: DC

## 2018-02-11 NOTE — Progress Notes (Signed)
Initial Nutrition Assessment  DOCUMENTATION CODES:   Obesity unspecified  INTERVENTION:  - Continue Boost Breeze TID. - Continue to encourage PO intakes.  - Continue to encourage adequate hydration.  - Will order daily multivitamin with minerals.    NUTRITION DIAGNOSIS:   Inadequate oral intake related to acute illness, poor appetite as evidenced by per patient/family report.  GOAL:   Patient will meet greater than or equal to 90% of their needs  MONITOR:   PO intake, Supplement acceptance, Weight trends, Labs, I & O's  REASON FOR ASSESSMENT:   Malnutrition Screening Tool  ASSESSMENT:   85 y.o. female with medical history significant for hypothyroid, HTN, ITP, chronic leukopenia (followed by hemeonc). She was admitted on 1/10 with persistent watery diarrhea resulting in dehydration and AKI and was diagnosed with acute gastroenteritis.   No intakes documented since admission. Patient reports feeling very sick today. Daughter, who is at bedside, reports that patient ate 2 bites of vanilla yogurt and 1-2 bites of a bagel for breakfast and did not want to order anything for lunch. Patient reports prior to feeling sick she was eating well and had a good appetite. Daughter reports that patient has been sick with poor appetite and intakes since last Tuesday (1/7).   Patient and daughter report ongoing diarrhea. Talked with them about the importance of adequate hydration and provided patient with pitcher full of ice water, per her request. Daughter reports that she has items such as Pedialyte at home for when patient is discharged. Boost Breeze was ordered TID and patient has accepted 4 out of 7 cartons.   Per chart review, current weight is 179 lb and weight on 12/3 was 184 lb. This indicates 5 lb weight loss (2.7% body weight) in the past 1 month; not significant for time frame.   Medications reviewed; 100 mcg oral synthroid/day, 2 mg imodium x1 dose 1/13, 40 mEq oral KCl x2 doses  1/12.  Labs reviewed; Cl: 115 mmol/l, BUN: 32 mg/dL, creatinine: 1.43 mg/dL, Ca: 7.7 mg/dL, GFR: 34 mL/min.  IVF; NS @ 125 mL/hr ordered, not infusing at this time.      NUTRITION - FOCUSED PHYSICAL EXAM:  Patient requested this not be done at this time d/t not feeling well.   Diet Order:   Diet Order            Diet - low sodium heart healthy        Diet regular Room service appropriate? Yes; Fluid consistency: Thin  Diet effective now              EDUCATION NEEDS:   Education needs have been addressed  Skin:  Skin Assessment: Reviewed RN Assessment  Last BM:  PTA/unknown  Height:   Ht Readings from Last 1 Encounters:  02/08/18 5\' 2"  (1.575 m)    Weight:   Wt Readings from Last 1 Encounters:  02/08/18 81.1 kg    Ideal Body Weight:  50 kg  BMI:  Body mass index is 32.7 kg/m.  Estimated Nutritional Needs:   Kcal:  1600-1865 kcal  Protein:  65-80 grams  Fluid:  >/= 2 L/day     Jarome Matin, MS, RD, LDN, [facility] Inpatient Clinical Dietitian Pager # [phone] After hours/weekend pager # [phone]

## 2018-02-11 NOTE — Plan of Care (Signed)
  Problem: Education: Goal: Knowledge of General Education information will improve Description Including pain rating scale, medication(s)/side effects and non-pharmacologic comfort measures Outcome: Progressing   

## 2018-02-11 NOTE — Discharge Instructions (Signed)
1)Hold Lisinopril and Lasix until Saturday 02/16/2018 due to kidney concerns 2)Take Doxycycline for left-sided facial infection as prescribed 3)Call back at [phone] on 02/12/2018 to get the results of your wound culture and to see if the antibiotics can be changed---  4)drink plenty water/fluids 5)Follow- up with PCP for repeat BMP and CBC within a week 6)Avoid ibuprofen/Advil/Aleve/Motrin/Goody Powders/Naproxen/BC powders/Meloxicam/Diclofenac/Indomethacin and other Nonsteroidal anti-inflammatory medications as these will make you more likely to bleed and can cause stomach ulcers, can also cause Kidney problems.

## 2018-02-11 NOTE — Evaluation (Signed)
Physical Therapy Evaluation Patient Details Name: Claudia Moore MRN: [id number] DOB: 03-31-1933 Today's Date: 02/11/2018   History of Present Illness  85 y.o.adultwith medical history significant forhypothyroid, HTN, ITP, chronic leukopenia (followed by heme) admitted on 02/08/2018 acute kidney injury and gastroenteritis  Clinical Impression  Pt admitted with above diagnosis. Pt currently with functional limitations due to the deficits listed below (see PT Problem List).  Pt mostly min/guard level except one instance of LOB upon returning to recliner requiring min assist.  Daughter present and states she can stay at her parents and assist as needed (she is in town for 2 weeks).  Pt would benefit from f/u HHPT if agreeable. Pt will benefit from skilled PT to increase their independence and safety with mobility to allow discharge to the venue listed below.       Follow Up Recommendations Home health PT;Supervision/Assistance - 24 hour    Equipment Recommendations  Cane    Recommendations for Other Services       Precautions / Restrictions Precautions Precautions: Fall Restrictions Weight Bearing Restrictions: No      Mobility  Bed Mobility Overal bed mobility: Needs Assistance Bed Mobility: Supine to Sit     Supine to sit: Supervision;HOB elevated        Transfers Overall transfer level: Needs assistance Equipment used: None Transfers: Sit to/from Stand Sit to Stand: Min guard         General transfer comment: min/guard for safety  Ambulation/Gait Ambulation/Gait assistance: Min guard;Min assist Gait Distance (Feet): 200 Feet Assistive device: None Gait Pattern/deviations: Step-through pattern;Narrow base of support;Decreased stride length     General Gait Details: pt remained near hand rail for UE support if needed, slight LOB upon returning to recliner (min assist to steady); pt reports weakness and fatigues quickly due to poor oral intake  Stairs             Wheelchair Mobility    Modified Rankin (Stroke Patients Only)       Balance Overall balance assessment: History of Falls                                           Pertinent Vitals/Pain Pain Assessment: No/denies pain    Home Living Family/patient expects to be discharged to:: Private residence Living Arrangements: Spouse/significant other Available Help at Discharge: Family;Available 24 hours/day(daughter can stay with pt upon d/c (visiting for 2 weeks)) Type of Home: House Home Access: Stairs to enter   [employer] of Steps: 3-4 Home Layout: One level Home Equipment: None      Prior Function Level of Independence: Independent               Hand Dominance        Extremity/Trunk Assessment        Lower Extremity Assessment Lower Extremity Assessment: Generalized weakness       Communication   Communication: No difficulties  Cognition Arousal/Alertness: Awake/alert Behavior During Therapy: WFL for tasks assessed/performed Overall Cognitive Status: Within Functional Limits for tasks assessed                                        General Comments      Exercises     Assessment/Plan    PT Assessment Patient needs continued PT services  PT Problem List Decreased strength;Decreased mobility;Decreased activity tolerance;Decreased balance;Decreased knowledge of use of DME;Decreased coordination;Decreased knowledge of precautions       PT Treatment Interventions Gait training;Therapeutic activities;DME instruction;Functional mobility training;Balance training;Patient/family education;Stair training;Therapeutic exercise;Neuromuscular re-education    PT Goals (Current goals can be found in the Care Plan section)  Acute Rehab PT Goals PT Goal Formulation: With patient Time For Goal Achievement: 02/25/18 Potential to Achieve Goals: Good    Frequency Min 3X/week   Barriers to discharge         Co-evaluation               AM-PAC PT "6 Clicks" Mobility  Outcome Measure Help needed turning from your back to your side while in a flat bed without using bedrails?: None Help needed moving from lying on your back to sitting on the side of a flat bed without using bedrails?: None Help needed moving to and from a bed to a chair (including a wheelchair)?: None Help needed standing up from a chair using your arms (e.g., wheelchair or bedside chair)?: A Little Help needed to walk in hospital room?: A Little Help needed climbing 3-5 steps with a railing? : A Little 6 Click Score: 21    End of Session Equipment Utilized During Treatment: Gait belt Activity Tolerance: Patient tolerated treatment well Patient left: in chair;with call bell/phone within reach;with family/visitor present Nurse Communication: Mobility status PT Visit Diagnosis: Other abnormalities of gait and mobility (R26.89)    Time: 4680-3212 PT Time Calculation (min) (ACUTE ONLY): 21 min   Charges:   PT Evaluation $PT Eval Low Complexity: [place], PT, DPT Acute Rehabilitation Services Office: [phone] Pager: [phone]  Trena Platt 02/11/2018, 12:14 PM

## 2018-02-11 NOTE — Care Management Important Message (Signed)
Important Message  Patient Details  Name: Claudia Moore MRN: [id number] Date of Birth: January 29, 1934   Medicare Important Message Given:  Yes    Kerin Salen 02/11/2018, 12:16 [place] Message  Patient Details  Name: Claudia Moore MRN: [id number] Date of Birth: 1933-04-27   Medicare Important Message Given:  Yes    Kerin Salen 02/11/2018, 12:16 PM

## 2018-02-11 NOTE — Discharge Summary (Signed)
Claudia Moore, is a 85 y.o. adult  DOB 1933/06/15  MRN [id number].  Admission date:  02/08/2018  Admitting Physician  Gwynne Edinger, MD  Discharge Date:  02/11/2018   Primary MD  Lujean Amel, MD  Recommendations for primary care physician for things to follow:   1)Hold Lisinopril and Lasix until Saturday 02/16/2018 due to kidney concerns 2)Take Doxycycline for left-sided facial infection as prescribed 3)Call back at [phone] on 02/12/2018 to get the results of your wound culture and to see if the antibiotics can be changed---  4)drink plenty water/fluids 5)Follow- up with PCP for repeat BMP and CBC within a week 6)Avoid ibuprofen/Advil/Aleve/Motrin/Goody Powders/Naproxen/BC powders/Meloxicam/Diclofenac/Indomethacin and other Nonsteroidal anti-inflammatory medications as these will make you more likely to bleed and can cause stomach ulcers, can also cause Kidney problems.     Admission Diagnosis  Elevated troponin [R79.89] AKI (acute kidney injury) ([place]) [N17.9] Nausea vomiting and diarrhea [R11.2, R19.7]   Discharge Diagnosis  Elevated troponin [R79.89] AKI (acute kidney injury) ([place]) [N17.9] Nausea vomiting and diarrhea [R11.2, R19.7]   Active Problems:   Acute gastroenteritis   AKI (acute kidney injury) ([place])   Hyponatremia   Elevated troponin   Elevated AST (SGOT)      Past Medical History:  Diagnosis Date  . Bilateral leg edema   . Chronic ITP (idiopathic thrombocytopenia) (HCC)   . Chronic leukopenia   . Depression   . Hypertension   . Thyroid disease     Past Surgical History:  Procedure Laterality Date  . ABDOMINAL HYSTERECTOMY     endometriosis   . APPENDECTOMY    . CHOLECYSTECTOMY    . COLONOSCOPY    . TONSILLECTOMY       HPI  from the history and physical done on the day of admission:     Chief Complaint: diarrhea  HPI: Claudia Moore is a 85 y.o. adult  with medical history significant for hypothyroid, htn, ITP, chronic leukopenia (followed by heme), who presents with above.  Symptoms began 3-4 days ago. Began with nausea and dry heaves, then shortly thereafter developed watery diarrhea that has continued, though improved with immodium. No blood or mucous. No fevers. No actual vomiting. Decreased appetite; po has been limited. General abdominal discomfort but no pain, nothing focal. No dysuria, no hematuria, no urgency, no suprapubic pain, no new urinary incontinence. No recent antibiotics. No recent travel. Husband had a mild case of diarrhea that started around the same time and has since resolved. This has not happened before. No chest pain, no sob or DOE. No cough or shortness of breath, no runny nose.   ED Course: IV fluids, labs, imaging    Hospital Course:   Brief Summary 85 y.o.adultwith medical history significant forhypothyroid, htn, ITP, chronic leukopenia (followed by heme) admitted on 02/08/2018 with persistent watery diarrhea resulting in dehydration and acute kidney injury  Plan 1)AKI----acute kidney injury on CKD stage - III due to poor oral intake and persistent diarrhea compounded by lisinopril and Lasix use, creatinine on admission=  1.93  ,   baseline creatinine =  0.9  , creatinine is now=  1.4    , renally adjust medications, avoid nephrotoxic agents / dehydration /hypotension------- continue IV fluids, oral fluids as tolerated as well, continue to hold lisinopril and Lasix until 02/16/18, avoid NSAIDs, push fluids  2)Elevated troponin-in the setting of AKI, no chest pains, no ACS type symptoms, EKG reassuring, echocardiogram without regional wall motion normalities, EF is preserved at above 55% to 60 %--- continue atenolol,  3)Gastroenteritis--- volume and frequency of stools is reduced, C. difficile and GI panel negative,, hydrate, advance diet, PRN antiemetics, may use Imodium as needed  4) left-sided facial  cellulitis and infection with purulent drainage--- see photo below, treat empirically with doxycycline 100 mg twice daily to cover for possible MRSA pending wound culture results, follow-up with dermatologist for biopsy of this lesion which may be a skin malignancy  Code Status : DNR  Family Communication:   Husband at bedside   Disposition Plan  : Home with home health physical therapy Consults  :  na  DVT Prophylaxis  :   - Heparin    Discharge Condition: Stable  Follow UP  Follow-up [facility] Follow [facility].   Specialty:  Home Health Services Why:  [facility] physical therapy Contact information: [address] [place] [address] [phone]           Diet and Activity recommendation:  As advised  Discharge Instructions    Discharge Instructions    Call MD for:  difficulty breathing, headache or visual disturbances   Complete by:  As directed    Call MD for:  persistant dizziness or light-headedness   Complete by:  As directed    Call MD for:  persistant nausea and vomiting   Complete by:  As directed    Call MD for:  severe uncontrolled pain   Complete by:  As directed    Call MD for:  temperature >100.4   Complete by:  As directed    Diet - low sodium heart healthy   Complete by:  As directed    Discharge instructions   Complete by:  As directed    1)Hold Lisinopril and Lasix until Saturday 02/16/2018 due to kidney concerns 2)Take Doxycycline for left-sided facial infection as prescribed 3)Call back at [phone] on 02/12/2018 to get the results of your wound culture and to see if the antibiotics can be changed---  4)drink plenty water/fluids 5)Follow- up with PCP for repeat BMP and CBC within a week 6)Avoid ibuprofen/Advil/Aleve/Motrin/Goody Powders/Naproxen/BC powders/Meloxicam/Diclofenac/Indomethacin and other Nonsteroidal anti-inflammatory medications as these will make you more likely to bleed and can cause  stomach ulcers, can also cause Kidney problems.   Increase activity slowly   Complete by:  As directed        Discharge Medications     Allergies as of 02/11/2018      Reactions   Pine Anaphylaxis   PIne Nuts   Celebrex [celecoxib] Other (See Comments)   Increase Heart Rate, Sweats and Nausea   Zyrtec [cetirizine] Other (See Comments)   Rapid heart rate and dizziness      Medication List    TAKE these medications   atenolol 25 MG tablet Commonly known as:  TENORMIN Take 12.5 mg by mouth daily.   doxycycline 100 MG tablet Commonly known as:  VIBRA-TABS Take 1 tablet (100 mg total) by mouth 2 (two) times daily.   eltrombopag 50 MG tablet Commonly  known as:  PROMACTA Take 1 tablet (50 mg total) by mouth daily. Increased dose due to progressive thrombocytopenia   escitalopram 10 MG tablet Commonly known as:  LEXAPRO Take 10 mg by mouth daily.   furosemide 40 MG tablet Commonly known as:  LASIX Take 1 tablet (40 mg total) by mouth daily as needed for fluid (Hold until Saturday 02/16/2018). Weight greater than >180 Hold until Saturday 02/16/2018 What changed:    when to take this  reasons to take this  additional instructions   levothyroxine 100 MCG tablet Commonly known as:  SYNTHROID, LEVOTHROID Take 100 mcg by mouth daily before breakfast.   lisinopril 20 MG tablet Commonly known as:  PRINIVIL,ZESTRIL Take 1 tablet (20 mg total) by mouth daily. Hold until Saturday 02/16/2018 What changed:  additional instructions   loperamide 2 MG tablet Commonly known as:  IMODIUM A-D Take 1 tablet (2 mg total) by mouth 3 (three) times daily as needed for diarrhea or loose stools. What changed:  when to take this   MULTIVITAMIN GUMMIES ADULT Chew Chew 1 tablet by mouth at bedtime.   ondansetron 4 MG disintegrating tablet Commonly known as:  ZOFRAN-ODT Take 1 tablet (4 mg total) by mouth every 8 (eight) hours as needed for nausea or vomiting.   Vitamin D 50 MCG (2000  UT) tablet Take 2,000 Units by mouth daily.       Major procedures and Radiology Reports - PLEASE review detailed and final reports for all details, in brief -    Ct Abdomen Pelvis Wo Contrast  Result Date: 02/08/2018 CLINICAL DATA:  Watery diarrhea. Abdominal pain. EXAM: CT ABDOMEN AND PELVIS WITHOUT CONTRAST TECHNIQUE: Multidetector CT imaging of the abdomen and pelvis was performed following the standard protocol without IV contrast. COMPARISON:  09/14/2017 FINDINGS: Lower chest: 5 mm posterior right middle lobe pulmonary nodule is stable. Hepatobiliary: The liver shows diffusely decreased attenuation suggesting steatosis. 2.6 cm cyst in the lateral segment left liver is stable. Gallbladder surgically absent. No intrahepatic or extrahepatic biliary dilation. Pancreas: No focal mass lesion. No dilatation of the main duct. No intraparenchymal cyst. No peripancreatic edema. Spleen: Calcified granulomata. Adrenals/Urinary Tract: No adrenal nodule or mass. Right kidney unremarkable. Central sinus cysts in the left kidney well demonstrated on prior study with contrast material. Small low-density lesions in the left kidney are compatible with cysts, also better demonstrated previously. No evidence for hydroureter. The urinary bladder appears normal for the degree of distention. Stomach/Bowel: Stomach is nondistended. No gastric wall thickening. No evidence of outlet obstruction. Duodenum is normally positioned as is the ligament of Treitz. No small bowel wall thickening. No small bowel dilatation. The terminal ileum is normal. The appendix is not visualized, but there is no edema or inflammation in the region of the cecum. No gross colonic mass. No colonic wall thickening. Vascular/Lymphatic: There is abdominal aortic atherosclerosis without aneurysm. There is no gastrohepatic or hepatoduodenal ligament lymphadenopathy. No intraperitoneal or retroperitoneal lymphadenopathy. No pelvic sidewall lymphadenopathy.  Reproductive: Uterus surgically absent.  There is no adnexal mass. Other: No intraperitoneal free fluid. Musculoskeletal: No worrisome lytic or sclerotic osseous abnormality. IMPRESSION: 1. No acute findings in the abdomen or pelvis. Specifically, no findings to explain the patient's history of diarrhea and pain. 2. Hepatic and left renal cysts. 3. Stable 5 mm right middle lobe pulmonary nodule since 09/14/2017. No follow-up needed if patient is low-risk. Non-contrast chest CT can be considered in 12 months if patient is high-risk. This recommendation follows the consensus statement: Guidelines  for Management of Incidental Pulmonary Nodules Detected on CT Images: From the Fleischner Society 2017; Radiology 2017; 284:[id number]. 4.  Aortic Atherosclerois (ICD10-170.0) Electronically Signed   By: Misty Stanley M.D.   On: 02/08/2018 21:25   Dg Chest 2 View  Result Date: 02/08/2018 CLINICAL DATA:  Abdominal pain, weakness, nausea, vomiting, diarrhea. EXAM: CHEST - 2 VIEW COMPARISON:  12/20/2011 FINDINGS: The heart size and mediastinal contours are within normal limits. Both lungs are clear. The visualized skeletal structures are unremarkable. IMPRESSION: No active cardiopulmonary disease. Electronically Signed   By: Lucienne Capers M.D.   On: 02/08/2018 21:31   Ct Head Wo Contrast  Result Date: 02/08/2018 CLINICAL DATA:  Head trauma, follow-up EXAM: CT HEAD WITHOUT CONTRAST TECHNIQUE: Contiguous axial images were obtained from the base of the skull through the vertex without intravenous contrast. COMPARISON:  None. FINDINGS: Brain: Patchy subcortical white matter hypodensities more so along the left parietal lobe are identified consistent with chronic microvascular ischemic disease. Mild involutional changes of the brain are noted. No large vascular territory infarction, hemorrhage, midline shift or edema. Midline fourth ventricle and basal cisterns without effacement. Vascular: Atherosclerosis of the carotid  siphons bilaterally. No hyperdense vessel sign. Skull: Intact Sinuses/Orbits: Partially included calcified density projects over the right maxilla possibly representing inspissated/desiccated mucus or partial volume averaging of the floor of the maxillary sinus or osteoma. Intact orbits and globes. Bilateral cataract extractions. Other: None IMPRESSION: Chronic appearing microvascular ischemic disease of periventricular and subcortical white matter. No acute intracranial abnormality. Electronically Signed   By: Ashley Royalty M.D.   On: 02/08/2018 21:21    Micro Results    Recent Results (from the past 240 hour(s))  Gastrointestinal Panel by PCR , Stool     Status: None   Collection Time: 02/09/18 12:04 AM  Result Value Ref Range Status   Campylobacter species NOT DETECTED NOT DETECTED Final   Plesimonas shigelloides NOT DETECTED NOT DETECTED Final   Salmonella species NOT DETECTED NOT DETECTED Final   Yersinia enterocolitica NOT DETECTED NOT DETECTED Final   Vibrio species NOT DETECTED NOT DETECTED Final   Vibrio cholerae NOT DETECTED NOT DETECTED Final   Enteroaggregative E coli (EAEC) NOT DETECTED NOT DETECTED Final   Enteropathogenic E coli (EPEC) NOT DETECTED NOT DETECTED Final   Enterotoxigenic E coli (ETEC) NOT DETECTED NOT DETECTED Final   Shiga like toxin producing E coli (STEC) NOT DETECTED NOT DETECTED Final   Shigella/Enteroinvasive E coli (EIEC) NOT DETECTED NOT DETECTED Final   Cryptosporidium NOT DETECTED NOT DETECTED Final   Cyclospora cayetanensis NOT DETECTED NOT DETECTED Final   Entamoeba histolytica NOT DETECTED NOT DETECTED Final   Giardia lamblia NOT DETECTED NOT DETECTED Final   Adenovirus F40/41 NOT DETECTED NOT DETECTED Final   Astrovirus NOT DETECTED NOT DETECTED Final   Norovirus GI/GII NOT DETECTED NOT DETECTED Final   Rotavirus A NOT DETECTED NOT DETECTED Final   Sapovirus (I, II, IV, and V) NOT DETECTED NOT DETECTED Final    Comment: Performed at [facility], [address] [place], [place] [address]  C difficile quick scan w PCR reflex     Status: None   Collection Time: 02/09/18 12:04 AM  Result Value Ref Range Status   C Diff antigen NEGATIVE NEGATIVE Final   C Diff toxin NEGATIVE NEGATIVE Final   C Diff interpretation No C. difficile detected.  Final    Comment: Performed at [facility], [place] [address] [place], [place] [address]  Aerobic  Culture (superficial specimen)     Status: None (Preliminary result)   Collection Time: 02/10/18 10:45 AM  Result Value Ref Range Status   Specimen Description   Final    FACE LEFT Performed at [place] [address], [place], [place] [address]    Special Requests   Final    NONE Performed at [facility], [place] [address] [place], [place] [address]    Gram Stain   Final    RARE SQUAMOUS EPITHELIAL CELLS PRESENT RARE WBC PRESENT, PREDOMINANTLY PMN MODERATE GRAM POSITIVE COCCI FEW GRAM NEGATIVE RODS    Culture   Final    CULTURE REINCUBATED FOR BETTER GROWTH Performed at [facility], [place] [address] [place], [place] [address]    Report Status PENDING  Incomplete       Today   Subjective    Claudia Moore today has no concerns, no fevers no chills voiding well, no flank pain          Patient has been seen and examined prior to discharge   Objective   Blood pressure 106/62, pulse 79, temperature 98 F (36.7 C), temperature source Oral, resp. rate 18, height 5\' 2"  (1.575 m), weight 81.1 kg, SpO2 95 %.   Intake/Output Summary (Last 24 hours) at 02/11/2018 1423 Last data filed at 02/11/2018 0835 Gross per 24 hour  Intake 1972.26 ml  Output 1600 ml  Net 372.26 ml    Exam Gen:- Awake Alert,  , no apparent distress  HEENT:- [place].AT, No sclera icterus Neck-Supple Neck,No JVD,.  Lungs-  CTAB , fair symmetrical air movement CV- S1, S2 normal, regular  Abd-  +ve B.Sounds, Abd Soft, No tenderness, no CVA  tenderness   Extremity:- No  edema, pedal pulses present  Psych-affect is appropriate, oriented x3 Neuro-no new focal deficits, no tremors Skin--left facial lesion with foul-smelling purulent drainage,  culture obtained, please see photo in epic   Data Review   CBC w Diff:  Lab Results  Component Value Date   WBC 3.3 (L) 02/10/2018   HGB 11.4 (L) 02/10/2018   HGB 12.3 12/24/2017   HCT 34.6 (L) 02/10/2018   PLT 50 (L) 02/10/2018   PLT 78 (L) 12/24/2017   LYMPHOPCT 21 01/29/2018   MONOPCT 3 01/29/2018   EOSPCT 1 01/29/2018   BASOPCT 1 01/29/2018    CMP:  Lab Results  Component Value Date   NA 141 02/11/2018   K 3.8 02/11/2018   CL 115 (H) 02/11/2018   CO2 18 (L) 02/11/2018   BUN 32 (H) 02/11/2018   CREATININE 1.43 (H) 02/11/2018   CREATININE 0.95 12/24/2017   PROT 5.7 (L) 02/10/2018   ALBUMIN 2.8 (L) 02/10/2018   BILITOT 0.6 02/10/2018   BILITOT 0.7 12/24/2017   ALKPHOS 34 (L) 02/10/2018   AST 56 (H) 02/10/2018   AST 16 12/24/2017   ALT 41 02/10/2018   ALT 15 12/24/2017  .   Total Discharge time is about 33 minutes  Roxan Hockey M.D on 02/11/2018 at 2:23 PM  Go to [email or web address] -  for contact info  [facility] - Office  [phone]

## 2018-02-11 NOTE — Care Management Note (Signed)
Case Management Note  Patient Details  Name: LAYNEE LOCKAMY MRN: [id number] Date of Birth: Nov 30, 1933  Subjective/Objective:Acute gastroenteritis. From home. PT recc HHPT-patient chose [facility] rep Santiago Glad aware of HHPT order, & d/c No further CM needs.                    Action/Plan:d/c home w/HHC.   Expected Discharge Date:                  Expected Discharge Plan:  [place]  In-House Referral:     Discharge planning Services  CM Consult  Post Acute Care Choice:    Choice offered to:  Patient  DME Arranged:    DME Agency:     HH Arranged:  PT [place]:  [place]  Status of Service:  Completed, signed off  If discussed at [place] of Stay Meetings, dates discussed:    Additional Comments:  Dessa Phi, RN 02/11/2018, 12:31 PM

## 2018-02-12 DIAGNOSIS — E871 Hypo-osmolality and hyponatremia: Secondary | ICD-10-CM | POA: Diagnosis not present

## 2018-02-12 DIAGNOSIS — N179 Acute kidney failure, unspecified: Secondary | ICD-10-CM | POA: Diagnosis not present

## 2018-02-12 DIAGNOSIS — N183 Chronic kidney disease, stage 3 (moderate): Secondary | ICD-10-CM | POA: Diagnosis not present

## 2018-02-12 DIAGNOSIS — Z9181 History of falling: Secondary | ICD-10-CM | POA: Diagnosis not present

## 2018-02-12 DIAGNOSIS — L03211 Cellulitis of face: Secondary | ICD-10-CM | POA: Diagnosis not present

## 2018-02-12 DIAGNOSIS — I129 Hypertensive chronic kidney disease with stage 1 through stage 4 chronic kidney disease, or unspecified chronic kidney disease: Secondary | ICD-10-CM | POA: Diagnosis not present

## 2018-02-12 DIAGNOSIS — K529 Noninfective gastroenteritis and colitis, unspecified: Secondary | ICD-10-CM | POA: Diagnosis not present

## 2018-02-13 ENCOUNTER — Telehealth: Payer: Self-pay

## 2018-02-13 LAB — AEROBIC CULTURE W GRAM STAIN (SUPERFICIAL SPECIMEN): Culture: NORMAL

## 2018-02-13 LAB — AEROBIC CULTURE  (SUPERFICIAL SPECIMEN)

## 2018-02-13 NOTE — Telephone Encounter (Signed)
Antony Haste, PT with [facility] called and left a message. Asking for verbal orders for PT 2 x a week for 3 weeks from Dr. Alvy Bimler.  Called back and given verbal order from Dr. Alvy Bimler for above.

## 2018-02-14 DIAGNOSIS — N183 Chronic kidney disease, stage 3 (moderate): Secondary | ICD-10-CM | POA: Diagnosis not present

## 2018-02-14 DIAGNOSIS — E871 Hypo-osmolality and hyponatremia: Secondary | ICD-10-CM | POA: Diagnosis not present

## 2018-02-14 DIAGNOSIS — I129 Hypertensive chronic kidney disease with stage 1 through stage 4 chronic kidney disease, or unspecified chronic kidney disease: Secondary | ICD-10-CM | POA: Diagnosis not present

## 2018-02-14 DIAGNOSIS — L03211 Cellulitis of face: Secondary | ICD-10-CM | POA: Diagnosis not present

## 2018-02-14 DIAGNOSIS — K529 Noninfective gastroenteritis and colitis, unspecified: Secondary | ICD-10-CM | POA: Diagnosis not present

## 2018-02-14 DIAGNOSIS — N179 Acute kidney failure, unspecified: Secondary | ICD-10-CM | POA: Diagnosis not present

## 2018-02-16 ENCOUNTER — Other Ambulatory Visit: Payer: Self-pay | Admitting: Hematology and Oncology

## 2018-02-18 ENCOUNTER — Telehealth: Payer: Self-pay

## 2018-02-18 DIAGNOSIS — K529 Noninfective gastroenteritis and colitis, unspecified: Secondary | ICD-10-CM | POA: Diagnosis not present

## 2018-02-18 DIAGNOSIS — Z09 Encounter for follow-up examination after completed treatment for conditions other than malignant neoplasm: Secondary | ICD-10-CM | POA: Diagnosis not present

## 2018-02-18 DIAGNOSIS — Z5181 Encounter for therapeutic drug level monitoring: Secondary | ICD-10-CM | POA: Diagnosis not present

## 2018-02-18 DIAGNOSIS — D649 Anemia, unspecified: Secondary | ICD-10-CM | POA: Diagnosis not present

## 2018-02-18 DIAGNOSIS — E871 Hypo-osmolality and hyponatremia: Secondary | ICD-10-CM | POA: Diagnosis not present

## 2018-02-18 NOTE — Telephone Encounter (Signed)
Husband called and left a message to call him regarding recent hospitalization.  Called back. He is asking if recent hospitalization on 1/10 for 3 days could be due to increasing the Promacta? Did Dr. Alvy Bimler know she was in the hospital? He wants to make sure you review her recent hospital results. The nausea/ vomiting and diarrhea came on gradually and PCP recommended they go to ER.

## 2018-02-18 NOTE — Telephone Encounter (Signed)
Called and gave husband below message. He verbalized understanding.

## 2018-02-18 NOTE — Telephone Encounter (Signed)
I was not aware of her hospital stay and they are not due to side-effects of Promacta

## 2018-02-19 DIAGNOSIS — K529 Noninfective gastroenteritis and colitis, unspecified: Secondary | ICD-10-CM | POA: Diagnosis not present

## 2018-02-19 DIAGNOSIS — N179 Acute kidney failure, unspecified: Secondary | ICD-10-CM | POA: Diagnosis not present

## 2018-02-19 DIAGNOSIS — I129 Hypertensive chronic kidney disease with stage 1 through stage 4 chronic kidney disease, or unspecified chronic kidney disease: Secondary | ICD-10-CM | POA: Diagnosis not present

## 2018-02-19 DIAGNOSIS — N183 Chronic kidney disease, stage 3 (moderate): Secondary | ICD-10-CM | POA: Diagnosis not present

## 2018-02-19 DIAGNOSIS — L03211 Cellulitis of face: Secondary | ICD-10-CM | POA: Diagnosis not present

## 2018-02-19 DIAGNOSIS — E871 Hypo-osmolality and hyponatremia: Secondary | ICD-10-CM | POA: Diagnosis not present

## 2018-02-20 ENCOUNTER — Other Ambulatory Visit: Payer: Self-pay | Admitting: Physician Assistant

## 2018-02-20 DIAGNOSIS — C44329 Squamous cell carcinoma of skin of other parts of face: Secondary | ICD-10-CM | POA: Diagnosis not present

## 2018-02-21 DIAGNOSIS — K529 Noninfective gastroenteritis and colitis, unspecified: Secondary | ICD-10-CM | POA: Diagnosis not present

## 2018-02-21 DIAGNOSIS — I129 Hypertensive chronic kidney disease with stage 1 through stage 4 chronic kidney disease, or unspecified chronic kidney disease: Secondary | ICD-10-CM | POA: Diagnosis not present

## 2018-02-21 DIAGNOSIS — L03211 Cellulitis of face: Secondary | ICD-10-CM | POA: Diagnosis not present

## 2018-02-21 DIAGNOSIS — N183 Chronic kidney disease, stage 3 (moderate): Secondary | ICD-10-CM | POA: Diagnosis not present

## 2018-02-21 DIAGNOSIS — N179 Acute kidney failure, unspecified: Secondary | ICD-10-CM | POA: Diagnosis not present

## 2018-02-21 DIAGNOSIS — E871 Hypo-osmolality and hyponatremia: Secondary | ICD-10-CM | POA: Diagnosis not present

## 2018-02-25 DIAGNOSIS — K529 Noninfective gastroenteritis and colitis, unspecified: Secondary | ICD-10-CM | POA: Diagnosis not present

## 2018-02-25 DIAGNOSIS — N183 Chronic kidney disease, stage 3 (moderate): Secondary | ICD-10-CM | POA: Diagnosis not present

## 2018-02-25 DIAGNOSIS — I129 Hypertensive chronic kidney disease with stage 1 through stage 4 chronic kidney disease, or unspecified chronic kidney disease: Secondary | ICD-10-CM | POA: Diagnosis not present

## 2018-02-25 DIAGNOSIS — N179 Acute kidney failure, unspecified: Secondary | ICD-10-CM | POA: Diagnosis not present

## 2018-02-25 DIAGNOSIS — L03211 Cellulitis of face: Secondary | ICD-10-CM | POA: Diagnosis not present

## 2018-02-25 DIAGNOSIS — E871 Hypo-osmolality and hyponatremia: Secondary | ICD-10-CM | POA: Diagnosis not present

## 2018-02-26 ENCOUNTER — Other Ambulatory Visit: Payer: Self-pay | Admitting: Physician Assistant

## 2018-02-26 DIAGNOSIS — D0462 Carcinoma in situ of skin of left upper limb, including shoulder: Secondary | ICD-10-CM | POA: Diagnosis not present

## 2018-02-27 ENCOUNTER — Other Ambulatory Visit: Payer: Self-pay | Admitting: Hematology and Oncology

## 2018-02-27 DIAGNOSIS — D693 Immune thrombocytopenic purpura: Secondary | ICD-10-CM

## 2018-03-01 ENCOUNTER — Encounter: Payer: Self-pay | Admitting: Hematology and Oncology

## 2018-03-01 ENCOUNTER — Telehealth: Payer: Self-pay | Admitting: Hematology and Oncology

## 2018-03-01 ENCOUNTER — Inpatient Hospital Stay: Payer: Medicare Other | Attending: Hematology and Oncology

## 2018-03-01 ENCOUNTER — Inpatient Hospital Stay (HOSPITAL_BASED_OUTPATIENT_CLINIC_OR_DEPARTMENT_OTHER): Payer: Medicare Other | Admitting: Hematology and Oncology

## 2018-03-01 ENCOUNTER — Telehealth: Payer: Self-pay

## 2018-03-01 DIAGNOSIS — Z79899 Other long term (current) drug therapy: Secondary | ICD-10-CM | POA: Insufficient documentation

## 2018-03-01 DIAGNOSIS — D61818 Other pancytopenia: Secondary | ICD-10-CM | POA: Insufficient documentation

## 2018-03-01 DIAGNOSIS — D693 Immune thrombocytopenic purpura: Secondary | ICD-10-CM

## 2018-03-01 DIAGNOSIS — C44309 Unspecified malignant neoplasm of skin of other parts of face: Secondary | ICD-10-CM | POA: Insufficient documentation

## 2018-03-01 DIAGNOSIS — R600 Localized edema: Secondary | ICD-10-CM

## 2018-03-01 DIAGNOSIS — Z85828 Personal history of other malignant neoplasm of skin: Secondary | ICD-10-CM | POA: Insufficient documentation

## 2018-03-01 DIAGNOSIS — M1A00X0 Idiopathic chronic gout, unspecified site, without tophus (tophi): Secondary | ICD-10-CM

## 2018-03-01 LAB — COMPREHENSIVE METABOLIC PANEL
ALK PHOS: 68 U/L (ref 38–126)
ALT: 32 U/L (ref 0–44)
AST: 44 U/L — ABNORMAL HIGH (ref 15–41)
Albumin: 3.6 g/dL (ref 3.5–5.0)
Anion gap: 11 (ref 5–15)
BUN: 18 mg/dL (ref 8–23)
CO2: 29 mmol/L (ref 22–32)
Calcium: 9.4 mg/dL (ref 8.9–10.3)
Chloride: 101 mmol/L (ref 98–111)
Creatinine, Ser: 0.79 mg/dL (ref 0.44–1.00)
GFR calc Af Amer: >60 mL/min (ref >60)
GFR calc non Af Amer: >60 mL/min (ref >60)
Glucose, Bld: 105 mg/dL — ABNORMAL HIGH (ref 70–99)
Potassium: 3.5 mmol/L (ref 3.5–5.1)
Sodium: 141 mmol/L (ref 135–145)
Total Bilirubin: 1.7 mg/dL — ABNORMAL HIGH (ref 0.3–1.2)
Total Protein: 6.9 g/dL (ref 6.5–8.1)

## 2018-03-01 LAB — CBC WITH DIFFERENTIAL/PLATELET
Abs Immature Granulocytes: 0.01 10*3/uL (ref 0.00–0.07)
Basophils Absolute: 0 10*3/uL (ref 0.0–0.1)
Basophils Relative: 0 %
Eosinophils Absolute: 0 10*3/uL (ref 0.0–0.5)
Eosinophils Relative: 0 %
HCT: 32 % — ABNORMAL LOW (ref 36.0–46.0)
Hemoglobin: 10.9 g/dL — ABNORMAL LOW (ref 12.0–15.0)
IMMATURE GRANULOCYTES: 1 %
Lymphocytes Relative: 39 %
Lymphs Abs: 0.7 10*3/uL (ref 0.7–4.0)
MCH: 30.1 pg (ref 26.0–34.0)
MCHC: 34.1 g/dL (ref 30.0–36.0)
MCV: 88.4 fL (ref 80.0–100.0)
MONOS PCT: 2 %
Monocytes Absolute: 0 10*3/uL — ABNORMAL LOW (ref 0.1–1.0)
Neutro Abs: 1.1 10*3/uL — ABNORMAL LOW (ref 1.7–7.7)
Neutrophils Relative %: 58 %
Platelets: 52 10*3/uL — ABNORMAL LOW (ref 150–400)
RBC: 3.62 MIL/uL — ABNORMAL LOW (ref 3.87–5.11)
RDW: 16.7 % — ABNORMAL HIGH (ref 11.5–15.5)
WBC: 1.8 10*3/uL — ABNORMAL LOW (ref 4.0–10.5)
nRBC: 0 % (ref 0.0–0.2)

## 2018-03-01 NOTE — Progress Notes (Signed)
[facility] OFFICE PROGRESS NOTE  Patient Care Team: [name], [name], MD as PCP - General (Family Medicine)  ASSESSMENT & PLAN:  Chronic ITP (idiopathic thrombocytopenia) (HCC) Her platelet count is stable She will continue Promacta 50 mg daily  Cancer of skin of external cheek She had recent skin cancer removed by her dermatologist I will defer to them for further management  Bilateral leg edema Her leg edema has improved and she has lost almost 20 pounds due to aggressive diuresis Her blood pressure is low and she is symptomatic I recommend holding of lisinopril and to cut furosemide to 20 mg daily I plan to see her again next week for further follow-up  Pancytopenia, acquired ([place]) She has significant pancytopenia likely due to recent surgery and bone marrow suppression from antibiotic treatment We discussed neutropenic precaution I recommend resumption of 10 mg of prednisone I plan to repeat blood work again next week for further follow-up   No orders of the defined types were placed in this encounter.   INTERVAL HISTORY: Please see below for problem oriented charting. She returns with her husband for further follow-up She was recently hospitalized She had skin cancer removed from her cheek and nose aggressive diuresis.  She has lost almost 25 pounds since her last time she was seen here Her leg swelling has improved She denies shortness of breath No recent infection, fever or chills The patient denies any recent signs or symptoms of bleeding such as spontaneous epistaxis, hematuria or hematochezia.   SUMMARY OF ONCOLOGIC HISTORY:  She was last seen in 2017, after referral for abnormal CBC Her total white blood cell count usually range slightly higher than upper limits of normal, between 11.5-12.9  With associated mild thrombocytopenia. Differential confirm mild neutrophilia She was recommend observation only She was subsequently referred back to me in  August 2019 due to progressive thrombocytopenia. CT scan showed no evidence of lymphoma.  She has responded well to low-dose prednisone therapy that was prescribed to treat her hives On 10/03/2017, she received 1 dose of IVIG with no response to therapy.  She also developed serum sickness after that. On October 25, 2017, she started on Promacta 25 mg daily along with prednisone taper On November 12, 2017, prednisone dose is reduced to 10 mg alternate with 5 mg every other day along with Promacta 25 mg daily On November 26, 2017, prednisone dose is reduced to 5 mg alternate with 0 mg every other day along with Promacta 25 mg daily and prednisone was subsequently discontinued On January 02, 2018, the dose of Promacta is increased to 50 mg daily   REVIEW OF SYSTEMS:   Constitutional: Denies fevers, chills or abnormal weight loss Eyes: Denies blurriness of vision Ears, nose, mouth, throat, and face: Denies mucositis or sore throat Respiratory: Denies cough, dyspnea or wheezes Cardiovascular: Denies palpitation, chest discomfort  Gastrointestinal:  Denies nausea, heartburn or change in bowel habits Lymphatics: Denies new lymphadenopathy or easy bruising Neurological:Denies numbness, tingling or new weaknesses Behavioral/Psych: Mood is stable, no new changes  All other systems were reviewed with the patient and are negative.  I have reviewed the past medical history, past surgical history, social history and family history with the patient and they are unchanged from previous note.  ALLERGIES:  is allergic to pine; celebrex [celecoxib]; and zyrtec [cetirizine].  MEDICATIONS:  Current Outpatient Medications  Medication Sig Dispense Refill  . predniSONE (DELTASONE) 5 MG tablet Take 10 mg by mouth daily with breakfast.    .  atenolol (TENORMIN) 25 MG tablet Take 12.5 mg by mouth daily.  0  . Cholecalciferol (VITAMIN D) 50 MCG (2000 UT) tablet Take 2,000 Units by mouth daily.    Marland Kitchen escitalopram  (LEXAPRO) 10 MG tablet Take 10 mg by mouth daily.  3  . furosemide (LASIX) 40 MG tablet TAKE 1 TABLET BY MOUTH EVERY DAY 30 tablet 1  . levothyroxine (SYNTHROID, LEVOTHROID) 100 MCG tablet Take 100 mcg by mouth daily before breakfast.    . Multiple Vitamins-Minerals (MULTIVITAMIN GUMMIES ADULT) CHEW Chew 1 tablet by mouth at bedtime.    . ondansetron (ZOFRAN-ODT) 4 MG disintegrating tablet Take 1 tablet (4 mg total) by mouth every 8 (eight) hours as needed for nausea or vomiting. 20 tablet 0  . PROMACTA 50 MG tablet TAKE 1 TABLET (50 MG TOTAL) BY MOUTH DAILY. INCREASED DOSE DUE TO PROGRESSIVE THROMBOCYTOPENIA 30 tablet 1   No current facility-administered medications for this visit.     PHYSICAL EXAMINATION: ECOG PERFORMANCE STATUS: 1 - Symptomatic but completely ambulatory  Vitals:   03/01/18 1144  BP: 107/82  Pulse: 83  Resp: 18  Temp: 97.6 F (36.4 C)  SpO2: 97%   Filed Weights   03/01/18 1144  Weight: 159 lb (72.1 kg)    GENERAL:alert, no distress and comfortable HEART: noted lower extremity edema NEURO: alert & oriented x 3 with fluent speech, no focal motor/sensory deficits  LABORATORY DATA:  I have reviewed the data as listed    Component Value Date/Time   NA 141 03/01/2018 1048   K 3.5 03/01/2018 1048   CL 101 03/01/2018 1048   CO2 29 03/01/2018 1048   GLUCOSE 105 (H) 03/01/2018 1048   BUN 18 03/01/2018 1048   CREATININE 0.79 03/01/2018 1048   CREATININE 0.95 12/24/2017 0735   CALCIUM 9.4 03/01/2018 1048   PROT 6.9 03/01/2018 1048   ALBUMIN 3.6 03/01/2018 1048   AST 44 (H) 03/01/2018 1048   AST 16 12/24/2017 0735   ALT 32 03/01/2018 1048   ALT 15 12/24/2017 0735   ALKPHOS 68 03/01/2018 1048   BILITOT 1.7 (H) 03/01/2018 1048   BILITOT 0.7 12/24/2017 0735   GFRNONAA >60 03/01/2018 1048   GFRNONAA 54 (L) 12/24/2017 0735   GFRAA >60 03/01/2018 1048   GFRAA >60 12/24/2017 0735    No results found for: SPEP, UPEP  Lab Results  Component Value Date    WBC 1.8 (L) 03/01/2018   NEUTROABS 1.1 (L) 03/01/2018   HGB 10.9 (L) 03/01/2018   HCT 32.0 (L) 03/01/2018   MCV 88.4 03/01/2018   PLT 52 (L) 03/01/2018      Chemistry      Component Value Date/Time   NA 141 03/01/2018 1048   K 3.5 03/01/2018 1048   CL 101 03/01/2018 1048   CO2 29 03/01/2018 1048   BUN 18 03/01/2018 1048   CREATININE 0.79 03/01/2018 1048   CREATININE 0.95 12/24/2017 0735      Component Value Date/Time   CALCIUM 9.4 03/01/2018 1048   ALKPHOS 68 03/01/2018 1048   AST 44 (H) 03/01/2018 1048   AST 16 12/24/2017 0735   ALT 32 03/01/2018 1048   ALT 15 12/24/2017 0735   BILITOT 1.7 (H) 03/01/2018 1048   BILITOT 0.7 12/24/2017 0735       RADIOGRAPHIC STUDIES: I have personally reviewed the radiological images as listed and agreed with the findings in the report. Ct Abdomen Pelvis Wo Contrast  Result Date: 02/08/2018 CLINICAL DATA:  Watery diarrhea. Abdominal pain.  EXAM: CT ABDOMEN AND PELVIS WITHOUT CONTRAST TECHNIQUE: Multidetector CT imaging of the abdomen and pelvis was performed following the standard protocol without IV contrast. COMPARISON:  09/14/2017 FINDINGS: Lower chest: 5 mm posterior right middle lobe pulmonary nodule is stable. Hepatobiliary: The liver shows diffusely decreased attenuation suggesting steatosis. 2.6 cm cyst in the lateral segment left liver is stable. Gallbladder surgically absent. No intrahepatic or extrahepatic biliary dilation. Pancreas: No focal mass lesion. No dilatation of the main duct. No intraparenchymal cyst. No peripancreatic edema. Spleen: Calcified granulomata. Adrenals/Urinary Tract: No adrenal nodule or mass. Right kidney unremarkable. Central sinus cysts in the left kidney well demonstrated on prior study with contrast material. Small low-density lesions in the left kidney are compatible with cysts, also better demonstrated previously. No evidence for hydroureter. The urinary bladder appears normal for the degree of distention.  Stomach/Bowel: Stomach is nondistended. No gastric wall thickening. No evidence of outlet obstruction. Duodenum is normally positioned as is the ligament of Treitz. No small bowel wall thickening. No small bowel dilatation. The terminal ileum is normal. The appendix is not visualized, but there is no edema or inflammation in the region of the cecum. No gross colonic mass. No colonic wall thickening. Vascular/Lymphatic: There is abdominal aortic atherosclerosis without aneurysm. There is no gastrohepatic or hepatoduodenal ligament lymphadenopathy. No intraperitoneal or retroperitoneal lymphadenopathy. No pelvic sidewall lymphadenopathy. Reproductive: Uterus surgically absent.  There is no adnexal mass. Other: No intraperitoneal free fluid. Musculoskeletal: No worrisome lytic or sclerotic osseous abnormality. IMPRESSION: 1. No acute findings in the abdomen or pelvis. Specifically, no findings to explain the patient's history of diarrhea and pain. 2. Hepatic and left renal cysts. 3. Stable 5 mm right middle lobe pulmonary nodule since 09/14/2017. No follow-up needed if patient is low-risk. Non-contrast chest CT can be considered in 12 months if patient is high-risk. This recommendation follows the consensus statement: Guidelines for Management of Incidental Pulmonary Nodules Detected on CT Images: From the Fleischner Society 2017; Radiology 2017; [id number]. 4.  Aortic Atherosclerois (ICD10-170.0) Electronically Signed   By: Misty Stanley M.D.   On: 02/08/2018 21:25   Dg Chest 2 View  Result Date: 02/08/2018 CLINICAL DATA:  Abdominal pain, weakness, nausea, vomiting, diarrhea. EXAM: CHEST - 2 VIEW COMPARISON:  12/20/2011 FINDINGS: The heart size and mediastinal contours are within normal limits. Both lungs are clear. The visualized skeletal structures are unremarkable. IMPRESSION: No active cardiopulmonary disease. Electronically Signed   By: Lucienne Capers M.D.   On: 02/08/2018 21:31   Ct Head Wo  Contrast  Result Date: 02/08/2018 CLINICAL DATA:  Head trauma, follow-up EXAM: CT HEAD WITHOUT CONTRAST TECHNIQUE: Contiguous axial images were obtained from the base of the skull through the vertex without intravenous contrast. COMPARISON:  None. FINDINGS: Brain: Patchy subcortical white matter hypodensities more so along the left parietal lobe are identified consistent with chronic microvascular ischemic disease. Mild involutional changes of the brain are noted. No large vascular territory infarction, hemorrhage, midline shift or edema. Midline fourth ventricle and basal cisterns without effacement. Vascular: Atherosclerosis of the carotid siphons bilaterally. No hyperdense vessel sign. Skull: Intact Sinuses/Orbits: Partially included calcified density projects over the right maxilla possibly representing inspissated/desiccated mucus or partial volume averaging of the floor of the maxillary sinus or osteoma. Intact orbits and globes. Bilateral cataract extractions. Other: None IMPRESSION: Chronic appearing microvascular ischemic disease of periventricular and subcortical white matter. No acute intracranial abnormality. Electronically Signed   By: Ashley Royalty M.D.   On: 02/08/2018 21:21    All  questions were answered. The patient knows to call the clinic with any problems, questions or concerns. No barriers to learning was detected.  I spent 15 minutes counseling the patient face to face. The total time spent in the appointment was 20 minutes and more than 50% was on counseling and review of test results  Heath Lark, MD 03/01/2018 2:41 PM

## 2018-03-01 NOTE — Telephone Encounter (Signed)
Gave avs and calendar ° °

## 2018-03-01 NOTE — Telephone Encounter (Signed)
Antony Haste, PT with [facility] called requesting orders for PT starting next week. 2 x week x 2 weeks, then 1 x a week for 1 week.  Called back and given verbal order to Antony Haste from Dr. Alvy Bimler for the above.

## 2018-03-01 NOTE — Assessment & Plan Note (Addendum)
She has significant pancytopenia likely due to recent surgery and bone marrow suppression from antibiotic treatment We discussed neutropenic precaution I recommend resumption of 10 mg of prednisone I plan to repeat blood work again next week for further follow-up

## 2018-03-01 NOTE — Assessment & Plan Note (Signed)
Her platelet count is stable She will continue Promacta 50 mg daily

## 2018-03-01 NOTE — Assessment & Plan Note (Signed)
She had recent skin cancer removed by her dermatologist I will defer to them for further management

## 2018-03-01 NOTE — Assessment & Plan Note (Signed)
Her leg edema has improved and she has lost almost 20 pounds due to aggressive diuresis Her blood pressure is low and she is symptomatic I recommend holding of lisinopril and to cut furosemide to 20 mg daily I plan to see her again next week for further follow-up

## 2018-03-05 ENCOUNTER — Other Ambulatory Visit: Payer: Self-pay | Admitting: Physician Assistant

## 2018-03-05 DIAGNOSIS — D0462 Carcinoma in situ of skin of left upper limb, including shoulder: Secondary | ICD-10-CM | POA: Diagnosis not present

## 2018-03-06 DIAGNOSIS — E871 Hypo-osmolality and hyponatremia: Secondary | ICD-10-CM | POA: Diagnosis not present

## 2018-03-06 DIAGNOSIS — L03211 Cellulitis of face: Secondary | ICD-10-CM | POA: Diagnosis not present

## 2018-03-06 DIAGNOSIS — K529 Noninfective gastroenteritis and colitis, unspecified: Secondary | ICD-10-CM | POA: Diagnosis not present

## 2018-03-06 DIAGNOSIS — I129 Hypertensive chronic kidney disease with stage 1 through stage 4 chronic kidney disease, or unspecified chronic kidney disease: Secondary | ICD-10-CM | POA: Diagnosis not present

## 2018-03-06 DIAGNOSIS — N179 Acute kidney failure, unspecified: Secondary | ICD-10-CM | POA: Diagnosis not present

## 2018-03-06 DIAGNOSIS — N183 Chronic kidney disease, stage 3 (moderate): Secondary | ICD-10-CM | POA: Diagnosis not present

## 2018-03-07 ENCOUNTER — Inpatient Hospital Stay: Payer: Medicare Other | Attending: Hematology and Oncology | Admitting: Hematology and Oncology

## 2018-03-07 ENCOUNTER — Encounter: Payer: Self-pay | Admitting: Hematology and Oncology

## 2018-03-07 ENCOUNTER — Telehealth: Payer: Self-pay | Admitting: Hematology and Oncology

## 2018-03-07 ENCOUNTER — Inpatient Hospital Stay: Payer: Medicare Other

## 2018-03-07 DIAGNOSIS — C44309 Unspecified malignant neoplasm of skin of other parts of face: Secondary | ICD-10-CM | POA: Insufficient documentation

## 2018-03-07 DIAGNOSIS — I10 Essential (primary) hypertension: Secondary | ICD-10-CM | POA: Diagnosis not present

## 2018-03-07 DIAGNOSIS — D61818 Other pancytopenia: Secondary | ICD-10-CM | POA: Diagnosis not present

## 2018-03-07 DIAGNOSIS — R600 Localized edema: Secondary | ICD-10-CM | POA: Insufficient documentation

## 2018-03-07 DIAGNOSIS — D693 Immune thrombocytopenic purpura: Secondary | ICD-10-CM

## 2018-03-07 DIAGNOSIS — Z79899 Other long term (current) drug therapy: Secondary | ICD-10-CM | POA: Insufficient documentation

## 2018-03-07 DIAGNOSIS — M1A00X0 Idiopathic chronic gout, unspecified site, without tophus (tophi): Secondary | ICD-10-CM

## 2018-03-07 LAB — CBC WITH DIFFERENTIAL/PLATELET
Abs Immature Granulocytes: 0.02 10*3/uL (ref 0.00–0.07)
BASOS ABS: 0 10*3/uL (ref 0.0–0.1)
Basophils Relative: 0 %
Eosinophils Absolute: 0 10*3/uL (ref 0.0–0.5)
Eosinophils Relative: 0 %
HCT: 29.7 % — ABNORMAL LOW (ref 36.0–46.0)
Hemoglobin: 9.8 g/dL — ABNORMAL LOW (ref 12.0–15.0)
Immature Granulocytes: 1 %
LYMPHS ABS: 0.9 10*3/uL (ref 0.7–4.0)
Lymphocytes Relative: 38 %
MCH: 30.2 pg (ref 26.0–34.0)
MCHC: 33 g/dL (ref 30.0–36.0)
MCV: 91.4 fL (ref 80.0–100.0)
Monocytes Absolute: 0.1 10*3/uL (ref 0.1–1.0)
Monocytes Relative: 2 %
NRBC: 0.8 % — AB (ref 0.0–0.2)
Neutro Abs: 1.4 10*3/uL — ABNORMAL LOW (ref 1.7–7.7)
Neutrophils Relative %: 59 %
Platelets: 78 10*3/uL — ABNORMAL LOW (ref 150–400)
RBC: 3.25 MIL/uL — ABNORMAL LOW (ref 3.87–5.11)
RDW: 20 % — AB (ref 11.5–15.5)
WBC: 2.5 10*3/uL — ABNORMAL LOW (ref 4.0–10.5)

## 2018-03-07 LAB — COMPREHENSIVE METABOLIC PANEL
ALT: 38 U/L (ref 0–44)
AST: 35 U/L (ref 15–41)
Albumin: 3.6 g/dL (ref 3.5–5.0)
Alkaline Phosphatase: 57 U/L (ref 38–126)
Anion gap: 9 (ref 5–15)
BUN: 16 mg/dL (ref 8–23)
CO2: 28 mmol/L (ref 22–32)
Calcium: 9.5 mg/dL (ref 8.9–10.3)
Chloride: 105 mmol/L (ref 98–111)
Creatinine, Ser: 0.78 mg/dL (ref 0.44–1.00)
GFR calc non Af Amer: >60 mL/min (ref >60)
Glucose, Bld: 100 mg/dL — ABNORMAL HIGH (ref 70–99)
Potassium: 4.1 mmol/L (ref 3.5–5.1)
Sodium: 142 mmol/L (ref 135–145)
Total Bilirubin: 1.4 mg/dL — ABNORMAL HIGH (ref 0.3–1.2)
Total Protein: 6.6 g/dL (ref 6.5–8.1)

## 2018-03-07 MED ORDER — FUROSEMIDE 40 MG PO TABS: 20.0000 mg | ORAL_TABLET | Freq: Every day | ORAL | 1 refills | Status: DC

## 2018-03-07 MED FILL — PROMACTA 50 MG TABLET: 50 | 30 days supply | Qty: 30 | Fill #0

## 2018-03-07 NOTE — Assessment & Plan Note (Signed)
Her blood pressure is improving nicely I recommend continue on furosemide and low-dose atenolol We will continue to hold lisinopril for now

## 2018-03-07 NOTE — Assessment & Plan Note (Signed)
The pancytopenia was related to recent hospital stay, renal failure and antibiotics treatment It is improving Observe only

## 2018-03-07 NOTE — Assessment & Plan Note (Signed)
Her leg edema is improving She will continue low-dose furosemide at 20 mg daily

## 2018-03-07 NOTE — Assessment & Plan Note (Signed)
Her blood counts are improving nicely with addition of prednisone recently I recommend reducing prednisone to 5 mg daily until assessment next week She will continue Promacta 50 mg daily

## 2018-03-07 NOTE — Telephone Encounter (Signed)
Gave avs and calendar ° °

## 2018-03-07 NOTE — Progress Notes (Signed)
[facility] OFFICE PROGRESS NOTE  [name], [name], MD  ASSESSMENT & PLAN:  Chronic ITP (idiopathic thrombocytopenia) (HCC) Her blood counts are improving nicely with addition of prednisone recently I recommend reducing prednisone to 5 mg daily until assessment next week She will continue Promacta 50 mg daily  Pancytopenia, acquired ([place]) The pancytopenia was related to recent hospital stay, renal failure and antibiotics treatment It is improving Observe only  Bilateral leg edema Her leg edema is improving She will continue low-dose furosemide at 20 mg daily  Essential hypertension Her blood pressure is improving nicely I recommend continue on furosemide and low-dose atenolol We will continue to hold lisinopril for now   No orders of the defined types were placed in this encounter.   INTERVAL HISTORY: Claudia Moore 85 y.o. adult returns for further evaluation and follow-up with her husband She is feeling well She is eating well and gaining some weight Her skin lesion is healing well The patient denies any recent signs or symptoms of bleeding such as spontaneous epistaxis, hematuria or hematochezia. Her blood pressure is stable.  SUMMARY OF HEMATOLOGIC HISTORY:  She was last seen in 2017, after referral for abnormal CBC Her total white blood cell count usually range slightly higher than upper limits of normal, between 11.5-12.9  With associated mild thrombocytopenia. Differential confirm mild neutrophilia She was recommend observation only She was subsequently referred back to me in August 2019 due to progressive thrombocytopenia. CT scan showed no evidence of lymphoma.  She has responded well to low-dose prednisone therapy that was prescribed to treat her hives On 10/03/2017, she received 1 dose of IVIG with no response to therapy.  She also developed serum sickness after that. On October 25, 2017, she started on Promacta 25 mg daily along with prednisone  taper On November 12, 2017, prednisone dose is reduced to 10 mg alternate with 5 mg every other day along with Promacta 25 mg daily On November 26, 2017, prednisone dose is reduced to 5 mg alternate with 0 mg every other day along with Promacta 25 mg daily and prednisone was subsequently discontinued On January 02, 2018, the dose of Promacta is increased to 50 mg daily  I have reviewed the past medical history, past surgical history, social history and family history with the patient and they are unchanged from previous note.  ALLERGIES:  is allergic to pine; celebrex [celecoxib]; and zyrtec [cetirizine].  MEDICATIONS:  Current Outpatient Medications  Medication Sig Dispense Refill  . atenolol (TENORMIN) 25 MG tablet Take 12.5 mg by mouth daily.  0  . Cholecalciferol (VITAMIN D) 50 MCG (2000 UT) tablet Take 2,000 Units by mouth daily.    Marland Kitchen escitalopram (LEXAPRO) 10 MG tablet Take 10 mg by mouth daily.  3  . furosemide (LASIX) 40 MG tablet Take 0.5 tablets (20 mg total) by mouth daily. 30 tablet 1  . levothyroxine (SYNTHROID, LEVOTHROID) 100 MCG tablet Take 100 mcg by mouth daily before breakfast.    . Multiple Vitamins-Minerals (MULTIVITAMIN GUMMIES ADULT) CHEW Chew 1 tablet by mouth at bedtime.    . ondansetron (ZOFRAN-ODT) 4 MG disintegrating tablet Take 1 tablet (4 mg total) by mouth every 8 (eight) hours as needed for nausea or vomiting. 20 tablet 0  . predniSONE (DELTASONE) 5 MG tablet Take 10 mg by mouth daily with breakfast.    . PROMACTA 50 MG tablet TAKE 1 TABLET (50 MG TOTAL) BY MOUTH DAILY. INCREASED DOSE DUE TO PROGRESSIVE THROMBOCYTOPENIA 30 tablet 1  No current facility-administered medications for this visit.      REVIEW OF SYSTEMS:   Constitutional: Denies fevers, chills or night sweats Eyes: Denies blurriness of vision Ears, nose, mouth, throat, and face: Denies mucositis or sore throat Respiratory: Denies cough, dyspnea or wheezes Gastrointestinal:  Denies nausea,  heartburn or change in bowel habits Lymphatics: Denies new lymphadenopathy or easy bruising Neurological:Denies numbness, tingling or new weaknesses Behavioral/Psych: Mood is stable, no new changes  All other systems were reviewed with the patient and are negative.  PHYSICAL EXAMINATION: ECOG PERFORMANCE STATUS: 1 - Symptomatic but completely ambulatory  Vitals:   03/07/18 0954  BP: 124/64  Pulse: 82  Resp: 18  Temp: (!) 97.4 F (36.3 C)  SpO2: 100%   Filed Weights   03/07/18 0954  Weight: 162 lb 12.8 oz (73.8 kg)    GENERAL:alert, no distress and comfortable SKIN: skin color, texture, turgor are normal, no rashes or significant lesions.  Previous skin surgery on the left cheek is healing well HEART: Noted mild persistent bilateral lower extremity edema NEURO: alert & oriented x 3 with fluent speech, no focal motor/sensory deficits  LABORATORY DATA:  I have reviewed the data as listed     Component Value Date/Time   NA 142 03/07/2018 0911   K 4.1 03/07/2018 0911   CL 105 03/07/2018 0911   CO2 28 03/07/2018 0911   GLUCOSE 100 (H) 03/07/2018 0911   BUN 16 03/07/2018 0911   CREATININE 0.78 03/07/2018 0911   CREATININE 0.95 12/24/2017 0735   CALCIUM 9.5 03/07/2018 0911   PROT 6.6 03/07/2018 0911   ALBUMIN 3.6 03/07/2018 0911   AST 35 03/07/2018 0911   AST 16 12/24/2017 0735   ALT 38 03/07/2018 0911   ALT 15 12/24/2017 0735   ALKPHOS 57 03/07/2018 0911   BILITOT 1.4 (H) 03/07/2018 0911   BILITOT 0.7 12/24/2017 0735   GFRNONAA >60 03/07/2018 0911   GFRNONAA 54 (L) 12/24/2017 0735   GFRAA >60 03/07/2018 0911   GFRAA >60 12/24/2017 0735    No results found for: SPEP, UPEP  Lab Results  Component Value Date   WBC 2.5 (L) 03/07/2018   NEUTROABS 1.4 (L) 03/07/2018   HGB 9.8 (L) 03/07/2018   HCT 29.7 (L) 03/07/2018   MCV 91.4 03/07/2018   PLT 78 (L) 03/07/2018      Chemistry      Component Value Date/Time   NA 142 03/07/2018 0911   K 4.1 03/07/2018 0911    CL 105 03/07/2018 0911   CO2 28 03/07/2018 0911   BUN 16 03/07/2018 0911   CREATININE 0.78 03/07/2018 0911   CREATININE 0.95 12/24/2017 0735      Component Value Date/Time   CALCIUM 9.5 03/07/2018 0911   ALKPHOS 57 03/07/2018 0911   AST 35 03/07/2018 0911   AST 16 12/24/2017 0735   ALT 38 03/07/2018 0911   ALT 15 12/24/2017 0735   BILITOT 1.4 (H) 03/07/2018 0911   BILITOT 0.7 12/24/2017 0735       I spent 15 minutes counseling the patient face to face. The total time spent in the appointment was 20 minutes and more than 50% was on counseling.   All questions were answered. The patient knows to call the clinic with any problems, questions or concerns. No barriers to learning was detected.    Heath Lark, MD 2/6/202010:43 AM

## 2018-03-14 ENCOUNTER — Inpatient Hospital Stay: Payer: Medicare Other

## 2018-03-14 ENCOUNTER — Telehealth: Payer: Self-pay | Admitting: Hematology and Oncology

## 2018-03-14 ENCOUNTER — Inpatient Hospital Stay (HOSPITAL_BASED_OUTPATIENT_CLINIC_OR_DEPARTMENT_OTHER): Payer: Medicare Other | Admitting: Hematology and Oncology

## 2018-03-14 DIAGNOSIS — C44309 Unspecified malignant neoplasm of skin of other parts of face: Secondary | ICD-10-CM | POA: Diagnosis not present

## 2018-03-14 DIAGNOSIS — I10 Essential (primary) hypertension: Secondary | ICD-10-CM | POA: Diagnosis not present

## 2018-03-14 DIAGNOSIS — D693 Immune thrombocytopenic purpura: Secondary | ICD-10-CM

## 2018-03-14 DIAGNOSIS — Z79899 Other long term (current) drug therapy: Secondary | ICD-10-CM | POA: Diagnosis not present

## 2018-03-14 DIAGNOSIS — R600 Localized edema: Secondary | ICD-10-CM

## 2018-03-14 DIAGNOSIS — M1A00X0 Idiopathic chronic gout, unspecified site, without tophus (tophi): Secondary | ICD-10-CM

## 2018-03-14 DIAGNOSIS — D61818 Other pancytopenia: Secondary | ICD-10-CM | POA: Diagnosis not present

## 2018-03-14 LAB — COMPREHENSIVE METABOLIC PANEL
ALT: 25 U/L (ref 0–44)
AST: 24 U/L (ref 15–41)
Albumin: 3.6 g/dL (ref 3.5–5.0)
Alkaline Phosphatase: 49 U/L (ref 38–126)
Anion gap: 9 (ref 5–15)
BUN: 11 mg/dL (ref 8–23)
CO2: 29 mmol/L (ref 22–32)
Calcium: 9.4 mg/dL (ref 8.9–10.3)
Chloride: 105 mmol/L (ref 98–111)
Creatinine, Ser: 0.8 mg/dL (ref 0.44–1.00)
GFR calc Af Amer: >60 mL/min (ref >60)
GFR calc non Af Amer: >60 mL/min (ref >60)
GLUCOSE: 95 mg/dL (ref 70–99)
Potassium: 3.9 mmol/L (ref 3.5–5.1)
Sodium: 143 mmol/L (ref 135–145)
Total Bilirubin: 1.2 mg/dL (ref 0.3–1.2)
Total Protein: 6.7 g/dL (ref 6.5–8.1)

## 2018-03-14 LAB — CBC WITH DIFFERENTIAL/PLATELET
Abs Immature Granulocytes: 0.01 10*3/uL (ref 0.00–0.07)
BASOS ABS: 0 10*3/uL (ref 0.0–0.1)
Basophils Relative: 0 %
EOS PCT: 0 %
Eosinophils Absolute: 0 10*3/uL (ref 0.0–0.5)
HCT: 33.9 % — ABNORMAL LOW (ref 36.0–46.0)
Hemoglobin: 10.9 g/dL — ABNORMAL LOW (ref 12.0–15.0)
Immature Granulocytes: 0 %
Lymphocytes Relative: 29 %
Lymphs Abs: 0.9 10*3/uL (ref 0.7–4.0)
MCH: 31.9 pg (ref 26.0–34.0)
MCHC: 32.2 g/dL (ref 30.0–36.0)
MCV: 99.1 fL (ref 80.0–100.0)
MONO ABS: 0.1 10*3/uL (ref 0.1–1.0)
Monocytes Relative: 2 %
NRBC: 0 % (ref 0.0–0.2)
Neutro Abs: 2 10*3/uL (ref 1.7–7.7)
Neutrophils Relative %: 69 %
Platelets: 83 10*3/uL — ABNORMAL LOW (ref 150–400)
RBC: 3.42 MIL/uL — ABNORMAL LOW (ref 3.87–5.11)
RDW: 23.7 % — ABNORMAL HIGH (ref 11.5–15.5)
WBC: 3 10*3/uL — AB (ref 4.0–10.5)

## 2018-03-14 NOTE — Telephone Encounter (Signed)
Gave avs and calendar ° °

## 2018-03-15 ENCOUNTER — Encounter: Payer: Self-pay | Admitting: Hematology and Oncology

## 2018-03-15 NOTE — Assessment & Plan Note (Signed)
Her blood counts are improving nicely with addition of prednisone recently I recommend reducing prednisone taper She will continue Promacta 50 mg daily

## 2018-03-15 NOTE — Progress Notes (Signed)
[place] OFFICE PROGRESS NOTE  Patient Care Team: [name], MD as PCP - General (Family Medicine)  ASSESSMENT & PLAN:  Chronic ITP (idiopathic thrombocytopenia) (HCC) Her blood counts are improving nicely with addition of prednisone recently I recommend reducing prednisone taper She will continue Promacta 50 mg daily  Bilateral leg edema Her leg edema is stable but she has persistent leg edema I recommend increasing the dose of furosemide to 40 mg daily. Our goal would be to get her weight down to around 155 pounds  Cancer of skin of external cheek She had recent skin cancer removed by her dermatologist I will defer to them for further management  Essential hypertension Her blood pressure is improving nicely I recommend continue on furosemide and low-dose atenolol We will continue to hold lisinopril for now   No orders of the defined types were placed in this encounter.   INTERVAL HISTORY: Please see below for problem oriented charting. She returns with her husband for further follow-up She brought with her copies of her blood pressure monitor at home which is stable Her weight is not improving when I reduce the dose of furosemide to 20 mg.  In fact, she has gained 2 pounds of weight The skin lesion on the left cheek is stable.  Her appetite is fair The patient denies any recent signs or symptoms of bleeding such as spontaneous epistaxis, hematuria or hematochezia.   SUMMARY OF ONCOLOGIC HISTORY:  No history exists.    REVIEW OF SYSTEMS:   Constitutional: Denies fevers, chills or abnormal weight loss Eyes: Denies blurriness of vision Ears, nose, mouth, throat, and face: Denies mucositis or sore throat Respiratory: Denies cough, dyspnea or wheezes Cardiovascular: Denies palpitation, chest discomfort or lower extremity swelling Gastrointestinal:  Denies nausea, heartburn or change in bowel habits Skin: Denies abnormal skin rashes Lymphatics: Denies  new lymphadenopathy or easy bruising Neurological:Denies numbness, tingling or new weaknesses Behavioral/Psych: Mood is stable, no new changes  All other systems were reviewed with the patient and are negative.  I have reviewed the past medical history, past surgical history, social history and family history with the patient and they are unchanged from previous note.  ALLERGIES:  is allergic to pine; celebrex [celecoxib]; and zyrtec [cetirizine].  MEDICATIONS:  Current Outpatient Medications  Medication Sig Dispense Refill  . atenolol (TENORMIN) 25 MG tablet Take 12.5 mg by mouth daily.  0  . Cholecalciferol (VITAMIN D) 50 MCG (2000 UT) tablet Take 2,000 Units by mouth daily.    Marland Kitchen escitalopram (LEXAPRO) 10 MG tablet Take 10 mg by mouth daily.  3  . furosemide (LASIX) 40 MG tablet Take 0.5 tablets (20 mg total) by mouth daily. 30 tablet 1  . levothyroxine (SYNTHROID, LEVOTHROID) 100 MCG tablet Take 100 mcg by mouth daily before breakfast.    . Multiple Vitamins-Minerals (MULTIVITAMIN GUMMIES ADULT) CHEW Chew 1 tablet by mouth at bedtime.    . ondansetron (ZOFRAN-ODT) 4 MG disintegrating tablet Take 1 tablet (4 mg total) by mouth every 8 (eight) hours as needed for nausea or vomiting. 20 tablet 0  . predniSONE (DELTASONE) 5 MG tablet Take 10 mg by mouth daily with breakfast.    . PROMACTA 50 MG tablet TAKE 1 TABLET (50 MG TOTAL) BY MOUTH DAILY. INCREASED DOSE DUE TO PROGRESSIVE THROMBOCYTOPENIA 30 tablet 1   No current facility-administered medications for this visit.     PHYSICAL EXAMINATION: ECOG PERFORMANCE STATUS: 2 - Symptomatic, <50% confined to bed  Vitals:   03/14/18  0949  BP: 132/72  Pulse: 69  Resp: 20  Temp: 98.2 F (36.8 C)  SpO2: 98%   Filed Weights   03/14/18 0949  Weight: 165 lb 6.4 oz (75 kg)    GENERAL:alert, no distress and comfortable SKIN: skin color, texture, turgor are normal, no rashes or significant lesions.  Noted persistent skin lesion on the left  cheek HEART: She has moderate 2+ lower extremity edema NEURO: alert & oriented x 3 with fluent speech, no focal motor/sensory deficits  LABORATORY DATA:  I have reviewed the data as listed    Component Value Date/Time   NA 143 03/14/2018 0923   K 3.9 03/14/2018 0923   CL 105 03/14/2018 0923   CO2 29 03/14/2018 0923   GLUCOSE 95 03/14/2018 0923   BUN 11 03/14/2018 0923   CREATININE 0.80 03/14/2018 0923   CREATININE 0.95 12/24/2017 0735   CALCIUM 9.4 03/14/2018 0923   PROT 6.7 03/14/2018 0923   ALBUMIN 3.6 03/14/2018 0923   AST 24 03/14/2018 0923   AST 16 12/24/2017 0735   ALT 25 03/14/2018 0923   ALT 15 12/24/2017 0735   ALKPHOS 49 03/14/2018 0923   BILITOT 1.2 03/14/2018 0923   BILITOT 0.7 12/24/2017 0735   GFRNONAA >60 03/14/2018 0923   GFRNONAA 54 (L) 12/24/2017 0735   GFRAA >60 03/14/2018 0923   GFRAA >60 12/24/2017 0735    No results found for: SPEP, UPEP  Lab Results  Component Value Date   WBC 3.0 (L) 03/14/2018   NEUTROABS 2.0 03/14/2018   HGB 10.9 (L) 03/14/2018   HCT 33.9 (L) 03/14/2018   MCV 99.1 03/14/2018   PLT 83 (L) 03/14/2018      Chemistry      Component Value Date/Time   NA 143 03/14/2018 0923   K 3.9 03/14/2018 0923   CL 105 03/14/2018 0923   CO2 29 03/14/2018 0923   BUN 11 03/14/2018 0923   CREATININE 0.80 03/14/2018 0923   CREATININE 0.95 12/24/2017 0735      Component Value Date/Time   CALCIUM 9.4 03/14/2018 0923   ALKPHOS 49 03/14/2018 0923   AST 24 03/14/2018 0923   AST 16 12/24/2017 0735   ALT 25 03/14/2018 0923   ALT 15 12/24/2017 0735   BILITOT 1.2 03/14/2018 0923   BILITOT 0.7 12/24/2017 0735       All questions were answered. The patient knows to call the clinic with any problems, questions or concerns. No barriers to learning was detected.  I spent 15 minutes counseling the patient face to face. The total time spent in the appointment was 20 minutes and more than 50% was on counseling and review of test results  Heath Lark, MD 03/15/2018 9:23 AM

## 2018-03-15 NOTE — Assessment & Plan Note (Signed)
Her blood pressure is improving nicely I recommend continue on furosemide and low-dose atenolol We will continue to hold lisinopril for now

## 2018-03-15 NOTE — Assessment & Plan Note (Signed)
Her leg edema is stable but she has persistent leg edema I recommend increasing the dose of furosemide to 40 mg daily. Our goal would be to get her weight down to around 155 pounds

## 2018-03-15 NOTE — Assessment & Plan Note (Signed)
She had recent skin cancer removed by her dermatologist I will defer to them for further management

## 2018-03-19 DIAGNOSIS — Z029 Encounter for administrative examinations, unspecified: Secondary | ICD-10-CM | POA: Diagnosis not present

## 2018-03-22 ENCOUNTER — Telehealth: Payer: Self-pay | Admitting: Hematology and Oncology

## 2018-03-22 ENCOUNTER — Inpatient Hospital Stay (HOSPITAL_BASED_OUTPATIENT_CLINIC_OR_DEPARTMENT_OTHER): Payer: Medicare Other | Admitting: Hematology and Oncology

## 2018-03-22 ENCOUNTER — Encounter: Payer: Self-pay | Admitting: Hematology and Oncology

## 2018-03-22 ENCOUNTER — Other Ambulatory Visit: Payer: Self-pay | Admitting: Hematology and Oncology

## 2018-03-22 ENCOUNTER — Inpatient Hospital Stay: Payer: Medicare Other

## 2018-03-22 DIAGNOSIS — D693 Immune thrombocytopenic purpura: Secondary | ICD-10-CM

## 2018-03-22 DIAGNOSIS — D61818 Other pancytopenia: Secondary | ICD-10-CM | POA: Diagnosis not present

## 2018-03-22 DIAGNOSIS — I10 Essential (primary) hypertension: Secondary | ICD-10-CM | POA: Diagnosis not present

## 2018-03-22 DIAGNOSIS — M1A00X0 Idiopathic chronic gout, unspecified site, without tophus (tophi): Secondary | ICD-10-CM

## 2018-03-22 DIAGNOSIS — Z79899 Other long term (current) drug therapy: Secondary | ICD-10-CM | POA: Diagnosis not present

## 2018-03-22 DIAGNOSIS — C44309 Unspecified malignant neoplasm of skin of other parts of face: Secondary | ICD-10-CM | POA: Diagnosis not present

## 2018-03-22 DIAGNOSIS — R600 Localized edema: Secondary | ICD-10-CM | POA: Diagnosis not present

## 2018-03-22 LAB — COMPREHENSIVE METABOLIC PANEL
ALT: 24 U/L (ref 0–44)
ANION GAP: 11 (ref 5–15)
AST: 22 U/L (ref 15–41)
Albumin: 3.9 g/dL (ref 3.5–5.0)
Alkaline Phosphatase: 51 U/L (ref 38–126)
BUN: 16 mg/dL (ref 8–23)
CO2: 28 mmol/L (ref 22–32)
Calcium: 9.8 mg/dL (ref 8.9–10.3)
Chloride: 103 mmol/L (ref 98–111)
Creatinine, Ser: 0.85 mg/dL (ref 0.44–1.00)
GFR calc non Af Amer: >60 mL/min (ref >60)
Glucose, Bld: 114 mg/dL — ABNORMAL HIGH (ref 70–99)
Potassium: 4.7 mmol/L (ref 3.5–5.1)
SODIUM: 142 mmol/L (ref 135–145)
Total Bilirubin: 1.2 mg/dL (ref 0.3–1.2)
Total Protein: 7 g/dL (ref 6.5–8.1)

## 2018-03-22 LAB — CBC WITH DIFFERENTIAL/PLATELET
Abs Immature Granulocytes: 0.03 10*3/uL (ref 0.00–0.07)
Basophils Absolute: 0 10*3/uL (ref 0.0–0.1)
Basophils Relative: 0 %
Eosinophils Absolute: 0 10*3/uL (ref 0.0–0.5)
Eosinophils Relative: 1 %
HCT: 36.7 % (ref 36.0–46.0)
Hemoglobin: 12 g/dL (ref 12.0–15.0)
Immature Granulocytes: 1 %
LYMPHS ABS: 1 10*3/uL (ref 0.7–4.0)
Lymphocytes Relative: 28 %
MCH: 32.3 pg (ref 26.0–34.0)
MCHC: 32.7 g/dL (ref 30.0–36.0)
MCV: 98.7 fL (ref 80.0–100.0)
Monocytes Absolute: 0.1 10*3/uL (ref 0.1–1.0)
Monocytes Relative: 1 %
Neutro Abs: 2.4 10*3/uL (ref 1.7–7.7)
Neutrophils Relative %: 69 %
Platelets: 95 10*3/uL — ABNORMAL LOW (ref 150–400)
RBC: 3.72 MIL/uL — ABNORMAL LOW (ref 3.87–5.11)
RDW: 21.4 % — ABNORMAL HIGH (ref 11.5–15.5)
WBC: 3.5 10*3/uL — ABNORMAL LOW (ref 4.0–10.5)
nRBC: 0 % (ref 0.0–0.2)

## 2018-03-22 NOTE — Progress Notes (Signed)
[facility] OFFICE PROGRESS NOTE  [name], MD  ASSESSMENT & PLAN:  Chronic ITP (idiopathic thrombocytopenia) (HCC) Her blood counts are improving nicely with addition of prednisone recently I recommend reducing prednisone with 5 mg every 3 days She will continue Promacta 50 mg daily  Bilateral leg edema Her leg edema is stable but she has persistent leg edema I recommend increasing the dose of furosemide to 40 mg daily and she is doing well Our goal would be to get her weight down to around 155 pounds   No orders of the defined types were placed in this encounter.   INTERVAL HISTORY: [facility] 85 y.o. adult returns for further follow-up with her husband She is doing well since last time I saw her Her weight at home is usually 2 to 3 pounds lower than what we measured Her leg swelling continues to improve Her blood pressure monitoring at home is stable She tolerated prednisone taper well.  SUMMARY OF HEMATOLOGIC HISTORY:  She was last seen in 2017, after referral for abnormal CBC Her total white blood cell count usually range slightly higher than upper limits of normal, between 11.5-12.9  With associated mild thrombocytopenia. Differential confirm mild neutrophilia She was recommend observation only She was subsequently referred back to me in August 2019 due to progressive thrombocytopenia. CT scan showed no evidence of lymphoma.  She has responded well to low-dose prednisone therapy that was prescribed to treat her hives On 10/03/2017, she received 1 dose of IVIG with no response to therapy.  She also developed serum sickness after that. On October 25, 2017, she started on Promacta 25 mg daily along with prednisone taper On November 12, 2017, prednisone dose is reduced to 10 mg alternate with 5 mg every other day along with Promacta 25 mg daily On November 26, 2017, prednisone dose is reduced to 5 mg alternate with 0 mg every other day along with Promacta 25  mg daily and prednisone was subsequently discontinued On January 02, 2018, the dose of Promacta is increased to 50 mg daily  I have reviewed the past medical history, past surgical history, social history and family history with the patient and they are unchanged from previous note.  ALLERGIES:  is allergic to pine; celebrex [celecoxib]; and zyrtec [cetirizine].  MEDICATIONS:  Current Outpatient Medications  Medication Sig Dispense Refill  . atenolol (TENORMIN) 25 MG tablet Take 12.5 mg by mouth daily.  0  . Cholecalciferol (VITAMIN D) 50 MCG (2000 UT) tablet Take 2,000 Units by mouth daily.    Marland Kitchen escitalopram (LEXAPRO) 10 MG tablet Take 10 mg by mouth daily.  3  . furosemide (LASIX) 40 MG tablet TAKE 1 TABLET BY MOUTH EVERY DAY 30 tablet 1  . levothyroxine (SYNTHROID, LEVOTHROID) 100 MCG tablet Take 100 mcg by mouth daily before breakfast.    . Multiple Vitamins-Minerals (MULTIVITAMIN GUMMIES ADULT) CHEW Chew 1 tablet by mouth at bedtime.    . predniSONE (DELTASONE) 5 MG tablet Take 5 mg by mouth every other day.    Marland Kitchen PROMACTA 50 MG tablet TAKE 1 TABLET (50 MG TOTAL) BY MOUTH DAILY. INCREASED DOSE DUE TO PROGRESSIVE THROMBOCYTOPENIA 30 tablet 1   No current facility-administered medications for this visit.      REVIEW OF SYSTEMS:   Constitutional: Denies fevers, chills or night sweats Eyes: Denies blurriness of vision Ears, nose, mouth, throat, and face: Denies mucositis or sore throat Respiratory: Denies cough, dyspnea or wheezes Cardiovascular: Denies palpitation, chest discomfort Gastrointestinal:  Denies nausea, heartburn or change in bowel habits Skin: Denies abnormal skin rashes Lymphatics: Denies new lymphadenopathy or easy bruising Neurological:Denies numbness, tingling or new weaknesses Behavioral/Psych: Mood is stable, no new changes  All other systems were reviewed with the patient and are negative.  PHYSICAL EXAMINATION: ECOG PERFORMANCE STATUS: 1 - Symptomatic but  completely ambulatory  Vitals:   03/22/18 1058  BP: (!) 126/55  Pulse: 73  Resp: 18  Temp: 97.8 F (36.6 C)  SpO2: 95%   Filed Weights   03/22/18 1058  Weight: 165 lb 12.8 oz (75.2 kg)    GENERAL:alert, no distress and comfortable SKIN: skin color, texture, turgor are normal, no rashes or significant lesions.  Noted persistent skin lesion on the left cheek HEART: Noted mild bilateral lower extremity edema  LABORATORY DATA:  I have reviewed the data as listed     Component Value Date/Time   NA 142 03/22/2018 1013   K 4.7 03/22/2018 1013   CL 103 03/22/2018 1013   CO2 28 03/22/2018 1013   GLUCOSE 114 (H) 03/22/2018 1013   BUN 16 03/22/2018 1013   CREATININE 0.85 03/22/2018 1013   CREATININE 0.95 12/24/2017 0735   CALCIUM 9.8 03/22/2018 1013   PROT 7.0 03/22/2018 1013   ALBUMIN 3.9 03/22/2018 1013   AST 22 03/22/2018 1013   AST 16 12/24/2017 0735   ALT 24 03/22/2018 1013   ALT 15 12/24/2017 0735   ALKPHOS 51 03/22/2018 1013   BILITOT 1.2 03/22/2018 1013   BILITOT 0.7 12/24/2017 0735   GFRNONAA >60 03/22/2018 1013   GFRNONAA 54 (L) 12/24/2017 0735   GFRAA >60 03/22/2018 1013   GFRAA >60 12/24/2017 0735    No results found for: SPEP, UPEP  Lab Results  Component Value Date   WBC 3.5 (L) 03/22/2018   NEUTROABS 2.4 03/22/2018   HGB 12.0 03/22/2018   HCT 36.7 03/22/2018   MCV 98.7 03/22/2018   PLT 95 (L) 03/22/2018      Chemistry      Component Value Date/Time   NA 142 03/22/2018 1013   K 4.7 03/22/2018 1013   CL 103 03/22/2018 1013   CO2 28 03/22/2018 1013   BUN 16 03/22/2018 1013   CREATININE 0.85 03/22/2018 1013   CREATININE 0.95 12/24/2017 0735      Component Value Date/Time   CALCIUM 9.8 03/22/2018 1013   ALKPHOS 51 03/22/2018 1013   AST 22 03/22/2018 1013   AST 16 12/24/2017 0735   ALT 24 03/22/2018 1013   ALT 15 12/24/2017 0735   BILITOT 1.2 03/22/2018 1013   BILITOT 0.7 12/24/2017 0735      I spent 10 minutes counseling the patient  face to face. The total time spent in the appointment was 15 minutes and more than 50% was on counseling.   All questions were answered. The patient knows to call the clinic with any problems, questions or concerns. No barriers to learning was detected.    Heath Lark, MD 2/21/20204:01 PM

## 2018-03-22 NOTE — Assessment & Plan Note (Signed)
Her blood counts are improving nicely with addition of prednisone recently I recommend reducing prednisone with 5 mg every 3 days She will continue Promacta 50 mg daily

## 2018-03-22 NOTE — Assessment & Plan Note (Signed)
Her leg edema is stable but she has persistent leg edema I recommend increasing the dose of furosemide to 40 mg daily and she is doing well Our goal would be to get her weight down to around 155 pounds

## 2018-03-22 NOTE — Telephone Encounter (Signed)
Gave avs and calendar ° °

## 2018-04-02 DIAGNOSIS — Z029 Encounter for administrative examinations, unspecified: Secondary | ICD-10-CM | POA: Diagnosis not present

## 2018-04-05 ENCOUNTER — Inpatient Hospital Stay: Payer: Medicare Other

## 2018-04-05 ENCOUNTER — Inpatient Hospital Stay: Payer: Medicare Other | Attending: Hematology and Oncology | Admitting: Hematology and Oncology

## 2018-04-05 ENCOUNTER — Other Ambulatory Visit: Payer: Self-pay | Admitting: Hematology and Oncology

## 2018-04-05 ENCOUNTER — Encounter: Payer: Self-pay | Admitting: Hematology and Oncology

## 2018-04-05 ENCOUNTER — Telehealth: Payer: Self-pay | Admitting: Hematology and Oncology

## 2018-04-05 DIAGNOSIS — D693 Immune thrombocytopenic purpura: Secondary | ICD-10-CM | POA: Insufficient documentation

## 2018-04-05 DIAGNOSIS — M1A00X0 Idiopathic chronic gout, unspecified site, without tophus (tophi): Secondary | ICD-10-CM

## 2018-04-05 DIAGNOSIS — C44309 Unspecified malignant neoplasm of skin of other parts of face: Secondary | ICD-10-CM

## 2018-04-05 DIAGNOSIS — I10 Essential (primary) hypertension: Secondary | ICD-10-CM

## 2018-04-05 DIAGNOSIS — E876 Hypokalemia: Secondary | ICD-10-CM | POA: Insufficient documentation

## 2018-04-05 DIAGNOSIS — Z79899 Other long term (current) drug therapy: Secondary | ICD-10-CM | POA: Diagnosis not present

## 2018-04-05 DIAGNOSIS — R600 Localized edema: Secondary | ICD-10-CM | POA: Diagnosis not present

## 2018-04-05 DIAGNOSIS — D61818 Other pancytopenia: Secondary | ICD-10-CM | POA: Insufficient documentation

## 2018-04-05 LAB — CBC WITH DIFFERENTIAL/PLATELET
ABS IMMATURE GRANULOCYTES: 0.02 10*3/uL (ref 0.00–0.07)
Basophils Absolute: 0 10*3/uL (ref 0.0–0.1)
Basophils Relative: 0 %
Eosinophils Absolute: 0.1 10*3/uL (ref 0.0–0.5)
Eosinophils Relative: 2 %
HCT: 36.1 % (ref 36.0–46.0)
Hemoglobin: 11.9 g/dL — ABNORMAL LOW (ref 12.0–15.0)
Immature Granulocytes: 1 %
LYMPHS PCT: 26 %
Lymphs Abs: 1 10*3/uL (ref 0.7–4.0)
MCH: 32.2 pg (ref 26.0–34.0)
MCHC: 33 g/dL (ref 30.0–36.0)
MCV: 97.8 fL (ref 80.0–100.0)
Monocytes Absolute: 0.1 10*3/uL (ref 0.1–1.0)
Monocytes Relative: 1 %
Neutro Abs: 2.6 10*3/uL (ref 1.7–7.7)
Neutrophils Relative %: 70 %
Platelets: 93 10*3/uL — ABNORMAL LOW (ref 150–400)
RBC: 3.69 MIL/uL — ABNORMAL LOW (ref 3.87–5.11)
RDW: 16.9 % — ABNORMAL HIGH (ref 11.5–15.5)
WBC: 3.7 10*3/uL — ABNORMAL LOW (ref 4.0–10.5)
nRBC: 0 % (ref 0.0–0.2)

## 2018-04-05 LAB — COMPREHENSIVE METABOLIC PANEL
ALT: 27 U/L (ref 0–44)
AST: 26 U/L (ref 15–41)
Albumin: 3.7 g/dL (ref 3.5–5.0)
Alkaline Phosphatase: 44 U/L (ref 38–126)
Anion gap: 11 (ref 5–15)
BUN: 14 mg/dL (ref 8–23)
CHLORIDE: 101 mmol/L (ref 98–111)
CO2: 27 mmol/L (ref 22–32)
Calcium: 9.2 mg/dL (ref 8.9–10.3)
Creatinine, Ser: 1.1 mg/dL — ABNORMAL HIGH (ref 0.44–1.00)
GFR calc Af Amer: 54 mL/min — ABNORMAL LOW (ref >60)
GFR calc non Af Amer: 47 mL/min — ABNORMAL LOW (ref >60)
Glucose, Bld: 109 mg/dL — ABNORMAL HIGH (ref 70–99)
POTASSIUM: 3 mmol/L — AB (ref 3.5–5.1)
SODIUM: 139 mmol/L (ref 135–145)
Total Bilirubin: 0.7 mg/dL (ref 0.3–1.2)
Total Protein: 6.8 g/dL (ref 6.5–8.1)

## 2018-04-05 MED ORDER — POTASSIUM CHLORIDE CRYS ER 20 MEQ PO TBCR: 20.0000 meq | EXTENDED_RELEASE_TABLET | Freq: Every day | ORAL | 1 refills | Status: DC

## 2018-04-05 NOTE — Assessment & Plan Note (Signed)
She has appointment to follow-up with dermatologist for another skin check and possible further resection I will defer to her dermatologist for further management Her platelet count is adequate and there should be no excessive risk of bleeding with platelet count greater than 50,000

## 2018-04-05 NOTE — Progress Notes (Signed)
[place] OFFICE PROGRESS NOTE  Patient Care Team: Claudia Small, MD as PCP - General (Family Medicine)  ASSESSMENT & PLAN:  Chronic ITP (idiopathic thrombocytopenia) (HCC) Her blood counts are improving nicely  I recommend stopping prednisone  She will continue Promacta 50 mg daily  Cancer of skin of external cheek She has appointment to follow-up with dermatologist for another skin check and possible further resection I will defer to her dermatologist for further management Her platelet count is adequate and there should be no excessive risk of bleeding with platelet count greater than 50,000  Hypokalemia This is likely due to furosemide I recommend potassium replacement therapy  Essential hypertension Her blood pressure control is satisfactory She will continue the same medication Leg swelling is also improving and she is maintaining her weight I do not plan to increase or change her blood pressure medications today   No orders of the defined types were placed in this encounter.   INTERVAL HISTORY: Please see below for problem oriented charting. She returns with her husband for further follow-up She is doing well Her weight is stable Her blood pressure control at home is excellent She complained of some mild hair loss The lesion on her cheek is growing again She has appointment to see dermatologist She complained of some dry skin Leg swelling is stable  SUMMARY OF HEMATOLOGIC HISTORY:  She was last seen in 2017, after referral for abnormal CBC Her total white blood cell count usually range slightly higher than upper limits of normal, between 11.5-12.9  With associated mild thrombocytopenia. Differential confirm mild neutrophilia She was recommend observation only She was subsequently referred back to me in August 2019 due to progressive thrombocytopenia. CT scan showed no evidence of lymphoma.  She has responded well to low-dose prednisone therapy  that was prescribed to treat her hives On 10/03/2017, she received 1 dose of IVIG with no response to therapy.  She also developed serum sickness after that. On October 25, 2017, she started on Promacta 25 mg daily along with prednisone taper On November 12, 2017, prednisone dose is reduced to 10 mg alternate with 5 mg every other day along with Promacta 25 mg daily On November 26, 2017, prednisone dose is reduced to 5 mg alternate with 0 mg every other day along with Promacta 25 mg daily and prednisone was subsequently discontinued On January 02, 2018, the dose of Promacta is increased to 50 mg daily On 04/05/2018, she will stop prednisone therapy and maintained on Promacta 50 mg daily  REVIEW OF SYSTEMS:   Constitutional: Denies fevers, chills or abnormal weight loss Eyes: Denies blurriness of vision Ears, nose, mouth, throat, and face: Denies mucositis or sore throat Respiratory: Denies cough, dyspnea or wheezes Cardiovascular: Denies palpitation, chest discomfort  Gastrointestinal:  Denies nausea, heartburn or change in bowel habits Lymphatics: Denies new lymphadenopathy or easy bruising Neurological:Denies numbness, tingling or new weaknesses Behavioral/Psych: Mood is stable, no new changes  All other systems were reviewed with the patient and are negative.  I have reviewed the past medical history, past surgical history, social history and family history with the patient and they are unchanged from previous note.  ALLERGIES:  is allergic to pine; celebrex [celecoxib]; and zyrtec [cetirizine].  MEDICATIONS:  Current Outpatient Medications  Medication Sig Dispense Refill  . atenolol (TENORMIN) 25 MG tablet Take 12.5 mg by mouth daily.  0  . Cholecalciferol (VITAMIN D) 50 MCG (2000 UT) tablet Take 2,000 Units by mouth daily.    Marland Kitchen  escitalopram (LEXAPRO) 10 MG tablet Take 10 mg by mouth daily.  3  . furosemide (LASIX) 40 MG tablet TAKE 1 TABLET BY MOUTH EVERY DAY 30 tablet 1  .  levothyroxine (SYNTHROID, LEVOTHROID) 100 MCG tablet Take 100 mcg by mouth daily before breakfast.    . Multiple Vitamins-Minerals (MULTIVITAMIN GUMMIES ADULT) CHEW Chew 1 tablet by mouth at bedtime.    . potassium chloride SA (K-DUR,KLOR-CON) 20 MEQ tablet Take 1 tablet (20 mEq total) by mouth daily. 30 tablet 1  . PROMACTA 50 MG tablet TAKE 1 TABLET (50 MG TOTAL) BY MOUTH DAILY. INCREASED DOSE DUE TO PROGRESSIVE THROMBOCYTOPENIA 30 tablet 1   No current facility-administered medications for this visit.     PHYSICAL EXAMINATION: ECOG PERFORMANCE STATUS: 1 - Symptomatic but completely ambulatory  Vitals:   04/05/18 1147  BP: (!) 131/54  Pulse: 68  Resp: 18  Temp: 98 F (36.7 C)  SpO2: 98%   Filed Weights   04/05/18 1147  Weight: 166 lb (75.3 kg)    GENERAL:alert, no distress and comfortable SKIN: There is a skin lesion on the left cheek, worrisome for recurrence of skin cancer HEART: Noted moderate bilateral lower extremity edema Musculoskeletal:no cyanosis of digits and no clubbing  NEURO: alert & oriented x 3 with fluent speech, no focal motor/sensory deficits  LABORATORY DATA:  I have reviewed the data as listed    Component Value Date/Time   NA 139 04/05/2018 1112   K 3.0 (L) 04/05/2018 1112   CL 101 04/05/2018 1112   CO2 27 04/05/2018 1112   GLUCOSE 109 (H) 04/05/2018 1112   BUN 14 04/05/2018 1112   CREATININE 1.10 (H) 04/05/2018 1112   CREATININE 0.95 12/24/2017 0735   CALCIUM 9.2 04/05/2018 1112   PROT 6.8 04/05/2018 1112   ALBUMIN 3.7 04/05/2018 1112   AST 26 04/05/2018 1112   AST 16 12/24/2017 0735   ALT 27 04/05/2018 1112   ALT 15 12/24/2017 0735   ALKPHOS 44 04/05/2018 1112   BILITOT 0.7 04/05/2018 1112   BILITOT 0.7 12/24/2017 0735   GFRNONAA 47 (L) 04/05/2018 1112   GFRNONAA 54 (L) 12/24/2017 0735   GFRAA 54 (L) 04/05/2018 1112   GFRAA >60 12/24/2017 0735    No results found for: SPEP, UPEP  Lab Results  Component Value Date   WBC 3.7 (L)  04/05/2018   NEUTROABS 2.6 04/05/2018   HGB 11.9 (L) 04/05/2018   HCT 36.1 04/05/2018   MCV 97.8 04/05/2018   PLT 93 (L) 04/05/2018      Chemistry      Component Value Date/Time   NA 139 04/05/2018 1112   K 3.0 (L) 04/05/2018 1112   CL 101 04/05/2018 1112   CO2 27 04/05/2018 1112   BUN 14 04/05/2018 1112   CREATININE 1.10 (H) 04/05/2018 1112   CREATININE 0.95 12/24/2017 0735      Component Value Date/Time   CALCIUM 9.2 04/05/2018 1112   ALKPHOS 44 04/05/2018 1112   AST 26 04/05/2018 1112   AST 16 12/24/2017 0735   ALT 27 04/05/2018 1112   ALT 15 12/24/2017 0735   BILITOT 0.7 04/05/2018 1112   BILITOT 0.7 12/24/2017 0735       All questions were answered. The patient knows to call the clinic with any problems, questions or concerns. No barriers to learning was detected.  I spent 15 minutes counseling the patient face to face. The total time spent in the appointment was 20 minutes and more than 50% was  on counseling and review of test results  Heath Lark, MD 04/05/2018 2:06 PM

## 2018-04-05 NOTE — Assessment & Plan Note (Signed)
Her blood pressure control is satisfactory She will continue the same medication Leg swelling is also improving and she is maintaining her weight I do not plan to increase or change her blood pressure medications today

## 2018-04-05 NOTE — Progress Notes (Unsigned)
Potassium=3.0 called to [facility] 04/05/2018 at 1341. [place], [place]

## 2018-04-05 NOTE — Telephone Encounter (Signed)
Gave avs and calendar ° °

## 2018-04-05 NOTE — Assessment & Plan Note (Signed)
Her blood counts are improving nicely  I recommend stopping prednisone  She will continue Promacta 50 mg daily

## 2018-04-05 NOTE — Assessment & Plan Note (Signed)
This is likely due to furosemide I recommend potassium replacement therapy

## 2018-04-08 ENCOUNTER — Other Ambulatory Visit: Payer: Self-pay

## 2018-04-08 MED ORDER — POTASSIUM CHLORIDE ER 10 MEQ PO TBCR: EXTENDED_RELEASE_TABLET | ORAL | 1 refills | Status: DC

## 2018-04-08 MED FILL — PROMACTA 50 MG TABLET: 50 | 30 days supply | Qty: 30 | Fill #1

## 2018-04-09 ENCOUNTER — Other Ambulatory Visit: Payer: Self-pay | Admitting: Physician Assistant

## 2018-04-09 DIAGNOSIS — C44329 Squamous cell carcinoma of skin of other parts of face: Secondary | ICD-10-CM | POA: Diagnosis not present

## 2018-04-16 DIAGNOSIS — Z029 Encounter for administrative examinations, unspecified: Secondary | ICD-10-CM | POA: Diagnosis not present

## 2018-04-19 ENCOUNTER — Inpatient Hospital Stay: Payer: Medicare Other

## 2018-04-19 ENCOUNTER — Inpatient Hospital Stay (HOSPITAL_BASED_OUTPATIENT_CLINIC_OR_DEPARTMENT_OTHER): Payer: Medicare Other | Admitting: Hematology and Oncology

## 2018-04-19 ENCOUNTER — Other Ambulatory Visit: Payer: Self-pay

## 2018-04-19 ENCOUNTER — Telehealth: Payer: Self-pay | Admitting: Hematology and Oncology

## 2018-04-19 ENCOUNTER — Encounter: Payer: Self-pay | Admitting: Hematology and Oncology

## 2018-04-19 VITALS — BP 136/81 | HR 66 | Temp 98.1°F | Resp 17 | Ht 62.0 in | Wt 175.4 lb

## 2018-04-19 DIAGNOSIS — I10 Essential (primary) hypertension: Secondary | ICD-10-CM | POA: Diagnosis not present

## 2018-04-19 DIAGNOSIS — D693 Immune thrombocytopenic purpura: Secondary | ICD-10-CM | POA: Diagnosis not present

## 2018-04-19 DIAGNOSIS — D61818 Other pancytopenia: Secondary | ICD-10-CM | POA: Diagnosis not present

## 2018-04-19 DIAGNOSIS — C44309 Unspecified malignant neoplasm of skin of other parts of face: Secondary | ICD-10-CM

## 2018-04-19 DIAGNOSIS — M1A00X0 Idiopathic chronic gout, unspecified site, without tophus (tophi): Secondary | ICD-10-CM

## 2018-04-19 DIAGNOSIS — E876 Hypokalemia: Secondary | ICD-10-CM | POA: Diagnosis not present

## 2018-04-19 DIAGNOSIS — R600 Localized edema: Secondary | ICD-10-CM

## 2018-04-19 LAB — CBC WITH DIFFERENTIAL/PLATELET
ABS IMMATURE GRANULOCYTES: 0.01 10*3/uL (ref 0.00–0.07)
Basophils Absolute: 0 10*3/uL (ref 0.0–0.1)
Basophils Relative: 1 %
Eosinophils Absolute: 0.1 10*3/uL (ref 0.0–0.5)
Eosinophils Relative: 2 %
HCT: 38 % (ref 36.0–46.0)
Hemoglobin: 12.5 g/dL (ref 12.0–15.0)
Immature Granulocytes: 0 %
Lymphocytes Relative: 32 %
Lymphs Abs: 1 10*3/uL (ref 0.7–4.0)
MCH: 33 pg (ref 26.0–34.0)
MCHC: 32.9 g/dL (ref 30.0–36.0)
MCV: 100.3 fL — ABNORMAL HIGH (ref 80.0–100.0)
MONO ABS: 0.1 10*3/uL (ref 0.1–1.0)
Monocytes Relative: 2 %
NEUTROS ABS: 1.9 10*3/uL (ref 1.7–7.7)
Neutrophils Relative %: 63 %
Platelets: 84 10*3/uL — ABNORMAL LOW (ref 150–400)
RBC: 3.79 MIL/uL — ABNORMAL LOW (ref 3.87–5.11)
RDW: 16.2 % — ABNORMAL HIGH (ref 11.5–15.5)
WBC: 3 10*3/uL — ABNORMAL LOW (ref 4.0–10.5)
nRBC: 0 % (ref 0.0–0.2)

## 2018-04-19 LAB — COMPREHENSIVE METABOLIC PANEL
ALT: 47 U/L — ABNORMAL HIGH (ref 0–44)
AST: 39 U/L (ref 15–41)
Albumin: 3.7 g/dL (ref 3.5–5.0)
Alkaline Phosphatase: 47 U/L (ref 38–126)
Anion gap: 9 (ref 5–15)
BUN: 11 mg/dL (ref 8–23)
CO2: 27 mmol/L (ref 22–32)
Calcium: 9.3 mg/dL (ref 8.9–10.3)
Chloride: 105 mmol/L (ref 98–111)
Creatinine, Ser: 0.88 mg/dL (ref 0.44–1.00)
GFR calc Af Amer: >60 mL/min (ref >60)
GFR calc non Af Amer: >60 mL/min (ref >60)
Glucose, Bld: 120 mg/dL — ABNORMAL HIGH (ref 70–99)
Potassium: 4.7 mmol/L (ref 3.5–5.1)
Sodium: 141 mmol/L (ref 135–145)
Total Bilirubin: 1.2 mg/dL (ref 0.3–1.2)
Total Protein: 6.9 g/dL (ref 6.5–8.1)

## 2018-04-19 MED ORDER — SPIRONOLACTONE 25 MG PO TABS: 25.0000 mg | ORAL_TABLET | Freq: Every day | ORAL | 1 refills | Status: DC

## 2018-04-19 NOTE — Assessment & Plan Note (Signed)
The pancytopenia is stable/ improving Observe only

## 2018-04-19 NOTE — Telephone Encounter (Signed)
Gave avs and calendar ° °

## 2018-04-19 NOTE — Assessment & Plan Note (Signed)
She has excessive bilateral lower extremity edema. Her documented heart rate at home is low. I have discontinue atenolol I recommend second diuretic with spironolactone.  I have discontinued her potassium supplement I discussed with the patient and her husband extensively about dietary modification.

## 2018-04-19 NOTE — Assessment & Plan Note (Signed)
She has appointment to follow-up with dermatologist for another skin check and possible further resection I will defer to her dermatologist for further management Her platelet count is adequate and there should be no excessive risk of bleeding with platelet count greater than 50,000

## 2018-04-19 NOTE — Assessment & Plan Note (Signed)
Her blood counts are stable She will continue Promacta 50 mg daily

## 2018-04-19 NOTE — Progress Notes (Signed)
[place] OFFICE PROGRESS NOTE  Patient Care Team: [name], MD as PCP - General (Family Medicine)  ASSESSMENT & PLAN:  Chronic ITP (idiopathic thrombocytopenia) (HCC) Her blood counts are stable She will continue Promacta 50 mg daily  Pancytopenia, acquired ([place]) The pancytopenia is stable/ improving Observe only  Cancer of skin of external cheek She has appointment to follow-up with dermatologist for another skin check and possible further resection I will defer to her dermatologist for further management Her platelet count is adequate and there should be no excessive risk of bleeding with platelet count greater than 50,000  Bilateral leg edema She has excessive bilateral lower extremity edema. Her documented heart rate at home is low. I have discontinue atenolol I recommend second diuretic with spironolactone.  I have discontinued her potassium supplement I discussed with the patient and her husband extensively about dietary modification.   No orders of the defined types were placed in this encounter.   INTERVAL HISTORY: Please see below for problem oriented charting. She returns with her husband for further follow-up She is currently referred to see a surgeon for Mohs procedure due to enlarging skin cancer on her left cheek She has gained a lot of fluid weight with diffuse edema.  She think is related to recent dietary noncompliance Her documented blood pressure at home is intermittently elevated but her heart rate is noted to be in the mid 50s. She denies recent infection, fever or chills. The patient denies any recent signs or symptoms of bleeding such as spontaneous epistaxis, hematuria or hematochezia.   SUMMARY OF ONCOLOGIC HISTORY:  She was last seen in 2017, after referral for abnormal CBC Her total white blood cell count usually range slightly higher than upper limits of normal, between 11.5-12.9  With associated mild thrombocytopenia.  Differential confirm mild neutrophilia She was recommend observation only She was subsequently referred back to me in August 2019 due to progressive thrombocytopenia. CT scan showed no evidence of lymphoma.  She has responded well to low-dose prednisone therapy that was prescribed to treat her hives On 10/03/2017, she received 1 dose of IVIG with no response to therapy.  She also developed serum sickness after that. On October 25, 2017, she started on Promacta 25 mg daily along with prednisone taper On November 12, 2017, prednisone dose is reduced to 10 mg alternate with 5 mg every other day along with Promacta 25 mg daily On November 26, 2017, prednisone dose is reduced to 5 mg alternate with 0 mg every other day along with Promacta 25 mg daily and prednisone was subsequently discontinued On January 02, 2018, the dose of Promacta is increased to 50 mg daily On 04/05/2018, she will stop prednisone therapy and maintained on Promacta 50 mg daily  REVIEW OF SYSTEMS:   Constitutional: Denies fevers, chills or abnormal weight loss Eyes: Denies blurriness of vision Ears, nose, mouth, throat, and face: Denies mucositis or sore throat Respiratory: Denies cough, dyspnea or wheezes Cardiovascular: Denies palpitation, chest discomfort  Gastrointestinal:  Denies nausea, heartburn or change in bowel habits Lymphatics: Denies new lymphadenopathy or easy bruising Neurological:Denies numbness, tingling or new weaknesses Behavioral/Psych: Mood is stable, no new changes  All other systems were reviewed with the patient and are negative.  I have reviewed the past medical history, past surgical history, social history and family history with the patient and they are unchanged from previous note.  ALLERGIES:  is allergic to pine; celebrex [celecoxib]; and zyrtec [cetirizine].  MEDICATIONS:  Current Outpatient  Medications  Medication Sig Dispense Refill  . Cholecalciferol (VITAMIN D) 50 MCG (2000 UT) tablet Take  2,000 Units by mouth daily.    Marland Kitchen escitalopram (LEXAPRO) 10 MG tablet Take 10 mg by mouth daily.  3  . furosemide (LASIX) 40 MG tablet TAKE 1 TABLET BY MOUTH EVERY DAY 30 tablet 1  . levothyroxine (SYNTHROID, LEVOTHROID) 100 MCG tablet Take 100 mcg by mouth daily before breakfast.    . Multiple Vitamins-Minerals (MULTIVITAMIN GUMMIES ADULT) CHEW Chew 1 tablet by mouth at bedtime.    Marland Kitchen PROMACTA 50 MG tablet TAKE 1 TABLET (50 MG TOTAL) BY MOUTH DAILY. INCREASED DOSE DUE TO PROGRESSIVE THROMBOCYTOPENIA 30 tablet 1  . spironolactone (ALDACTONE) 25 MG tablet Take 1 tablet (25 mg total) by mouth daily. 30 tablet 1   No current facility-administered medications for this visit.     PHYSICAL EXAMINATION: ECOG PERFORMANCE STATUS: 1 - Symptomatic but completely ambulatory  Vitals:   04/19/18 1032  BP: 136/81  Pulse: 66  Resp: 17  Temp: 98.1 F (36.7 C)  SpO2: 96%   Filed Weights   04/19/18 1032  Weight: 175 lb 6.4 oz (79.6 kg)    GENERAL:alert, no distress and comfortable SKIN: Noted skin lesion on the left cheek HEART: Noted moderate bilateral lower extremity edema NEURO: alert & oriented x 3 with fluent speech, no focal motor/sensory deficits  LABORATORY DATA:  I have reviewed the data as listed    Component Value Date/Time   NA 141 04/19/2018 1009   K 4.7 04/19/2018 1009   CL 105 04/19/2018 1009   CO2 27 04/19/2018 1009   GLUCOSE 120 (H) 04/19/2018 1009   BUN 11 04/19/2018 1009   CREATININE 0.88 04/19/2018 1009   CREATININE 0.95 12/24/2017 0735   CALCIUM 9.3 04/19/2018 1009   PROT 6.9 04/19/2018 1009   ALBUMIN 3.7 04/19/2018 1009   AST 39 04/19/2018 1009   AST 16 12/24/2017 0735   ALT 47 (H) 04/19/2018 1009   ALT 15 12/24/2017 0735   ALKPHOS 47 04/19/2018 1009   BILITOT 1.2 04/19/2018 1009   BILITOT 0.7 12/24/2017 0735   GFRNONAA >60 04/19/2018 1009   GFRNONAA 54 (L) 12/24/2017 0735   GFRAA >60 04/19/2018 1009   GFRAA >60 12/24/2017 0735    No results found for:  SPEP, UPEP  Lab Results  Component Value Date   WBC 3.0 (L) 04/19/2018   NEUTROABS 1.9 04/19/2018   HGB 12.5 04/19/2018   HCT 38.0 04/19/2018   MCV 100.3 (H) 04/19/2018   PLT 84 (L) 04/19/2018      Chemistry      Component Value Date/Time   NA 141 04/19/2018 1009   K 4.7 04/19/2018 1009   CL 105 04/19/2018 1009   CO2 27 04/19/2018 1009   BUN 11 04/19/2018 1009   CREATININE 0.88 04/19/2018 1009   CREATININE 0.95 12/24/2017 0735      Component Value Date/Time   CALCIUM 9.3 04/19/2018 1009   ALKPHOS 47 04/19/2018 1009   AST 39 04/19/2018 1009   AST 16 12/24/2017 0735   ALT 47 (H) 04/19/2018 1009   ALT 15 12/24/2017 0735   BILITOT 1.2 04/19/2018 1009   BILITOT 0.7 12/24/2017 0735      All questions were answered. The patient knows to call the clinic with any problems, questions or concerns. No barriers to learning was detected.  I spent 15 minutes counseling the patient face to face. The total time spent in the appointment was 20 minutes and  more than 50% was on counseling and review of test results  Heath Lark, MD 04/19/2018 3:55 PM

## 2018-04-20 ENCOUNTER — Other Ambulatory Visit: Payer: Self-pay | Admitting: Hematology and Oncology

## 2018-04-24 DIAGNOSIS — C44329 Squamous cell carcinoma of skin of other parts of face: Secondary | ICD-10-CM | POA: Diagnosis not present

## 2018-04-25 ENCOUNTER — Other Ambulatory Visit: Payer: Self-pay | Admitting: Hematology and Oncology

## 2018-04-25 DIAGNOSIS — D693 Immune thrombocytopenic purpura: Secondary | ICD-10-CM

## 2018-05-01 ENCOUNTER — Other Ambulatory Visit: Payer: Self-pay | Admitting: Hematology and Oncology

## 2018-05-03 ENCOUNTER — Other Ambulatory Visit: Payer: Self-pay

## 2018-05-03 ENCOUNTER — Inpatient Hospital Stay (HOSPITAL_BASED_OUTPATIENT_CLINIC_OR_DEPARTMENT_OTHER): Payer: Medicare Other | Admitting: Hematology and Oncology

## 2018-05-03 ENCOUNTER — Inpatient Hospital Stay: Payer: Medicare Other | Attending: Hematology and Oncology

## 2018-05-03 ENCOUNTER — Telehealth: Payer: Self-pay | Admitting: *Deleted

## 2018-05-03 ENCOUNTER — Encounter: Payer: Self-pay | Admitting: Hematology and Oncology

## 2018-05-03 DIAGNOSIS — Z79899 Other long term (current) drug therapy: Secondary | ICD-10-CM | POA: Insufficient documentation

## 2018-05-03 DIAGNOSIS — C44309 Unspecified malignant neoplasm of skin of other parts of face: Secondary | ICD-10-CM

## 2018-05-03 DIAGNOSIS — M1A00X0 Idiopathic chronic gout, unspecified site, without tophus (tophi): Secondary | ICD-10-CM

## 2018-05-03 DIAGNOSIS — R600 Localized edema: Secondary | ICD-10-CM

## 2018-05-03 DIAGNOSIS — D693 Immune thrombocytopenic purpura: Secondary | ICD-10-CM | POA: Diagnosis not present

## 2018-05-03 DIAGNOSIS — D72819 Decreased white blood cell count, unspecified: Secondary | ICD-10-CM

## 2018-05-03 LAB — COMPREHENSIVE METABOLIC PANEL
ALT: 36 U/L (ref 0–44)
AST: 30 U/L (ref 15–41)
Albumin: 3.9 g/dL (ref 3.5–5.0)
Alkaline Phosphatase: 51 U/L (ref 38–126)
Anion gap: 10 (ref 5–15)
BUN: 14 mg/dL (ref 8–23)
CO2: 28 mmol/L (ref 22–32)
Calcium: 9.4 mg/dL (ref 8.9–10.3)
Chloride: 103 mmol/L (ref 98–111)
Creatinine, Ser: 0.9 mg/dL (ref 0.44–1.00)
GFR calc Af Amer: >60 mL/min (ref >60)
GFR calc non Af Amer: 60 mL/min — ABNORMAL LOW (ref >60)
Glucose, Bld: 118 mg/dL — ABNORMAL HIGH (ref 70–99)
Potassium: 3.7 mmol/L (ref 3.5–5.1)
Sodium: 141 mmol/L (ref 135–145)
Total Bilirubin: 1.6 mg/dL — ABNORMAL HIGH (ref 0.3–1.2)
Total Protein: 7 g/dL (ref 6.5–8.1)

## 2018-05-03 LAB — CBC WITH DIFFERENTIAL/PLATELET
Abs Immature Granulocytes: 0.01 10*3/uL (ref 0.00–0.07)
Basophils Absolute: 0 10*3/uL (ref 0.0–0.1)
Basophils Relative: 1 %
Eosinophils Absolute: 0.1 10*3/uL (ref 0.0–0.5)
Eosinophils Relative: 2 %
HCT: 37.2 % (ref 36.0–46.0)
Hemoglobin: 12.7 g/dL (ref 12.0–15.0)
Immature Granulocytes: 0 %
Lymphocytes Relative: 32 %
Lymphs Abs: 0.8 10*3/uL (ref 0.7–4.0)
MCH: 33.3 pg (ref 26.0–34.0)
MCHC: 34.1 g/dL (ref 30.0–36.0)
MCV: 97.6 fL (ref 80.0–100.0)
Monocytes Absolute: 0.1 10*3/uL (ref 0.1–1.0)
Monocytes Relative: 2 %
Neutro Abs: 1.6 10*3/uL — ABNORMAL LOW (ref 1.7–7.7)
Neutrophils Relative %: 63 %
Platelets: 78 10*3/uL — ABNORMAL LOW (ref 150–400)
RBC: 3.81 MIL/uL — ABNORMAL LOW (ref 3.87–5.11)
RDW: 15.8 % — ABNORMAL HIGH (ref 11.5–15.5)
WBC: 2.6 10*3/uL — ABNORMAL LOW (ref 4.0–10.5)
nRBC: 0 % (ref 0.0–0.2)

## 2018-05-03 NOTE — Progress Notes (Signed)
Claudia Moore  Claudia Small, MD  ASSESSMENT & PLAN:  Chronic ITP (idiopathic thrombocytopenia) (HCC) Her blood counts are stable She will continue Promacta 50 mg daily The goal is only to keep platelet count above 50,000 She has no recent bleeding complication  Cancer of skin of external cheek She had recent successful Mohs surgery Her wound appears to be healing well She will continue close follow-up with dermatologist  Bilateral leg edema Her leg edema has improved She will continue dual diuretic therapy  Leukopenia She has chronic persistent leukopenia but not neutropenic She will continue neutropenic precaution   No orders of the defined types were placed in this encounter.   INTERVAL HISTORY: Claudia Moore 85 y.o. female returns for further follow-up She had recent Mohs procedure performed on 04/24/2018 Her wound is healing well She denies recent infection, fever or chills The patient denies any recent signs or symptoms of bleeding such as spontaneous epistaxis, hematuria or hematochezia. Her leg swelling has improved Her blood pressure control at home is satisfactory.  Her heart rate is stable/improved with recent discontinuation of beta-blocker  SUMMARY OF HEMATOLOGIC HISTORY:  She was last seen in 2017, after referral for abnormal CBC Her total white blood cell count usually range slightly higher than upper limits of normal, between 11.5-12.9  With associated mild thrombocytopenia. Differential confirm mild neutrophilia She was recommend observation only She was subsequently referred back to me in August 2019 due to progressive thrombocytopenia. CT scan showed no evidence of lymphoma.  She has responded well to low-dose prednisone therapy that was prescribed to treat her hives On 10/03/2017, she received 1 dose of IVIG with no response to therapy.  She also developed serum sickness after that. On October 25, 2017, she started on  Promacta 25 mg daily along with prednisone taper On November 12, 2017, prednisone dose is reduced to 10 mg alternate with 5 mg every other day along with Promacta 25 mg daily On November 26, 2017, prednisone dose is reduced to 5 mg alternate with 0 mg every other day along with Promacta 25 mg daily and prednisone was subsequently discontinued On January 02, 2018, the dose of Promacta is increased to 50 mg daily On 04/05/2018, she had stopped prednisone therapy and maintained on Promacta 50 mg daily  I have reviewed the past medical history, past surgical history, social history and family history with the patient and they are unchanged from previous Moore.  ALLERGIES:  is allergic to pine; celebrex [celecoxib]; and zyrtec [cetirizine].  MEDICATIONS:  Current Outpatient Medications  Medication Sig Dispense Refill  . Cholecalciferol (VITAMIN D) 50 MCG (2000 UT) tablet Take 2,000 Units by mouth daily.    Marland Kitchen escitalopram (LEXAPRO) 10 MG tablet Take 10 mg by mouth daily.  3  . furosemide (LASIX) 40 MG tablet TAKE 1 TABLET BY MOUTH EVERY DAY 30 tablet 1  . levothyroxine (SYNTHROID, LEVOTHROID) 100 MCG tablet Take 100 mcg by mouth daily before breakfast.    . Multiple Vitamins-Minerals (MULTIVITAMIN GUMMIES ADULT) CHEW Chew 1 tablet by mouth at bedtime.    Marland Kitchen PROMACTA 50 MG tablet TAKE 1 TABLET (50 MG TOTAL) BY MOUTH DAILY. INCREASED DOSE DUE TO PROGRESSIVE THROMBOCYTOPENIA 30 tablet 1  . spironolactone (ALDACTONE) 25 MG tablet Take 1 tablet (25 mg total) by mouth daily. 30 tablet 1   No current facility-administered medications for this visit.      REVIEW OF SYSTEMS:   Constitutional: Denies fevers, chills or night sweats  Eyes: Denies blurriness of vision Ears, nose, mouth, throat, and face: Denies mucositis or sore throat Respiratory: Denies cough, dyspnea or wheezes Cardiovascular: Denies palpitation, chest discomfort  Gastrointestinal:  Denies nausea, heartburn or change in bowel  habits Lymphatics: Denies new lymphadenopathy or easy bruising Neurological:Denies numbness, tingling or new weaknesses Behavioral/Psych: Mood is stable, no new changes  All other systems were reviewed with the patient and are negative.  PHYSICAL EXAMINATION: ECOG PERFORMANCE STATUS: 1 - Symptomatic but completely ambulatory  Vitals:   05/03/18 1115  BP: 140/65  Pulse: 76  Resp: 18  Temp: 98.6 F (37 C)  SpO2: 97%   Filed Weights   05/03/18 1115  Weight: 172 lb 9.6 oz (78.3 kg)    GENERAL:alert, no distress and comfortable SKIN: skin color, texture, turgor are normal, no rashes or significant lesions.  There is well-healed surgical scar on the left cheek HEART: Noted bilateral moderate lower extremity edema NEURO: alert & oriented x 3 with fluent speech, no focal motor/sensory deficits  LABORATORY DATA:  I have reviewed the data as listed     Component Value Date/Time   NA 141 04/19/2018 1009   K 4.7 04/19/2018 1009   CL 105 04/19/2018 1009   CO2 27 04/19/2018 1009   GLUCOSE 120 (H) 04/19/2018 1009   BUN 11 04/19/2018 1009   CREATININE 0.88 04/19/2018 1009   CREATININE 0.95 12/24/2017 0735   CALCIUM 9.3 04/19/2018 1009   PROT 6.9 04/19/2018 1009   ALBUMIN 3.7 04/19/2018 1009   AST 39 04/19/2018 1009   AST 16 12/24/2017 0735   ALT 47 (H) 04/19/2018 1009   ALT 15 12/24/2017 0735   ALKPHOS 47 04/19/2018 1009   BILITOT 1.2 04/19/2018 1009   BILITOT 0.7 12/24/2017 0735   GFRNONAA >60 04/19/2018 1009   GFRNONAA 54 (L) 12/24/2017 0735   GFRAA >60 04/19/2018 1009   GFRAA >60 12/24/2017 0735    No results found for: SPEP, UPEP  Lab Results  Component Value Date   WBC 2.6 (L) 05/03/2018   NEUTROABS 1.6 (L) 05/03/2018   HGB 12.7 05/03/2018   HCT 37.2 05/03/2018   MCV 97.6 05/03/2018   PLT 78 (L) 05/03/2018      Chemistry      Component Value Date/Time   NA 141 04/19/2018 1009   K 4.7 04/19/2018 1009   CL 105 04/19/2018 1009   CO2 27 04/19/2018 1009    BUN 11 04/19/2018 1009   CREATININE 0.88 04/19/2018 1009   CREATININE 0.95 12/24/2017 0735      Component Value Date/Time   CALCIUM 9.3 04/19/2018 1009   ALKPHOS 47 04/19/2018 1009   AST 39 04/19/2018 1009   AST 16 12/24/2017 0735   ALT 47 (H) 04/19/2018 1009   ALT 15 12/24/2017 0735   BILITOT 1.2 04/19/2018 1009   BILITOT 0.7 12/24/2017 0735      I spent 15 minutes counseling the patient face to face. The total time spent in the appointment was 20 minutes and more than 50% was on counseling.   All questions were answered. The patient knows to call the clinic with any problems, questions or concerns. No barriers to learning was detected.    Heath Lark, MD 4/3/202011:35 AM

## 2018-05-03 NOTE — Assessment & Plan Note (Signed)
She had recent successful Mohs surgery Her wound appears to be healing well She will continue close follow-up with dermatologist

## 2018-05-03 NOTE — Telephone Encounter (Signed)
Telephone call to patient to advise lab results from today. Patient appreciated call. No questions or concerns at this time.

## 2018-05-03 NOTE — Assessment & Plan Note (Signed)
She has chronic persistent leukopenia but not neutropenic She will continue neutropenic precaution

## 2018-05-03 NOTE — Telephone Encounter (Signed)
-----   Message from Heath Lark, MD sent at 05/03/2018 11:40 AM EDT ----- Regarding: CMP norMAL Pls let her know her potassium and renal functions are ok

## 2018-05-03 NOTE — Assessment & Plan Note (Signed)
Her blood counts are stable She will continue Promacta 50 mg daily The goal is only to keep platelet count above 50,000 She has no recent bleeding complication

## 2018-05-03 NOTE — Assessment & Plan Note (Signed)
Her leg edema has improved She will continue dual diuretic therapy

## 2018-05-06 ENCOUNTER — Telehealth: Payer: Self-pay | Admitting: Hematology and Oncology

## 2018-05-06 MED FILL — PROMACTA 50 MG TABLET: 50 | 30 days supply | Qty: 30 | Fill #0

## 2018-05-06 NOTE — Telephone Encounter (Signed)
Scheduled per 4/3 sch msg. Will mail printout.

## 2018-05-06 NOTE — Telephone Encounter (Signed)
Scheduled per sch msg. Called patient, no answer. Left message. Will mail schedule

## 2018-05-12 ENCOUNTER — Other Ambulatory Visit: Payer: Self-pay | Admitting: Hematology and Oncology

## 2018-05-13 ENCOUNTER — Telehealth: Payer: Self-pay

## 2018-05-13 ENCOUNTER — Encounter: Payer: Self-pay | Admitting: Hematology and Oncology

## 2018-05-13 NOTE — Telephone Encounter (Signed)
She called back. She is going to call dermatologist tomorrow for appt. The picture is from earlier today and the hives looks better now. Instructed to call office if needed. She verbalized understanding.

## 2018-05-13 NOTE — Telephone Encounter (Signed)
She called and left a message to call her.  Called back. No answer. Left a message asking to call the office back.

## 2018-05-13 NOTE — Telephone Encounter (Signed)
Called and left a message asking her to call the office back. 

## 2018-05-13 NOTE — Telephone Encounter (Signed)
She called back. She is having hives, that started 5 days ago. She is taking benadryl and using Caladryl lotion/ and ice. The hives started on her buttocks but move around. Today they are on her arms. Her daughter is going to try and send a picture on mychart and contact her dermatologist. She tried come new salads at [employer], and is questioning if they may have had pine nuts, she is allergic.  She is looking for suggestions or appt with Dr. Alvy Bimler if she thinks that it is needed.

## 2018-05-16 ENCOUNTER — Other Ambulatory Visit: Payer: Self-pay | Admitting: Hematology and Oncology

## 2018-06-03 ENCOUNTER — Other Ambulatory Visit: Payer: Self-pay | Admitting: Hematology and Oncology

## 2018-06-03 DIAGNOSIS — K760 Fatty (change of) liver, not elsewhere classified: Secondary | ICD-10-CM

## 2018-06-03 DIAGNOSIS — R600 Localized edema: Secondary | ICD-10-CM

## 2018-06-04 ENCOUNTER — Other Ambulatory Visit: Payer: Self-pay

## 2018-06-04 ENCOUNTER — Inpatient Hospital Stay: Payer: Medicare Other

## 2018-06-04 ENCOUNTER — Inpatient Hospital Stay: Payer: Medicare Other | Attending: Hematology and Oncology | Admitting: Hematology and Oncology

## 2018-06-04 VITALS — BP 147/60 | HR 70 | Temp 97.6°F | Resp 18 | Ht 62.0 in | Wt 175.0 lb

## 2018-06-04 DIAGNOSIS — R600 Localized edema: Secondary | ICD-10-CM | POA: Diagnosis not present

## 2018-06-04 DIAGNOSIS — D61818 Other pancytopenia: Secondary | ICD-10-CM | POA: Diagnosis not present

## 2018-06-04 DIAGNOSIS — I10 Essential (primary) hypertension: Secondary | ICD-10-CM | POA: Insufficient documentation

## 2018-06-04 DIAGNOSIS — K760 Fatty (change of) liver, not elsewhere classified: Secondary | ICD-10-CM

## 2018-06-04 DIAGNOSIS — Z79899 Other long term (current) drug therapy: Secondary | ICD-10-CM | POA: Diagnosis not present

## 2018-06-04 DIAGNOSIS — L509 Urticaria, unspecified: Secondary | ICD-10-CM | POA: Diagnosis not present

## 2018-06-04 DIAGNOSIS — M1A00X0 Idiopathic chronic gout, unspecified site, without tophus (tophi): Secondary | ICD-10-CM

## 2018-06-04 DIAGNOSIS — D693 Immune thrombocytopenic purpura: Secondary | ICD-10-CM | POA: Diagnosis not present

## 2018-06-04 DIAGNOSIS — C44309 Unspecified malignant neoplasm of skin of other parts of face: Secondary | ICD-10-CM | POA: Diagnosis not present

## 2018-06-04 LAB — CBC WITH DIFFERENTIAL/PLATELET
Abs Immature Granulocytes: 0.01 10*3/uL (ref 0.00–0.07)
Basophils Absolute: 0 10*3/uL (ref 0.0–0.1)
Basophils Relative: 1 %
Eosinophils Absolute: 0 10*3/uL (ref 0.0–0.5)
Eosinophils Relative: 2 %
HCT: 33 % — ABNORMAL LOW (ref 36.0–46.0)
Hemoglobin: 11 g/dL — ABNORMAL LOW (ref 12.0–15.0)
Immature Granulocytes: 1 %
Lymphocytes Relative: 42 %
Lymphs Abs: 0.9 10*3/uL (ref 0.7–4.0)
MCH: 34.4 pg — ABNORMAL HIGH (ref 26.0–34.0)
MCHC: 33.3 g/dL (ref 30.0–36.0)
MCV: 103.1 fL — ABNORMAL HIGH (ref 80.0–100.0)
Monocytes Absolute: 0.1 10*3/uL (ref 0.1–1.0)
Monocytes Relative: 4 %
Neutro Abs: 1.1 10*3/uL — ABNORMAL LOW (ref 1.7–7.7)
Neutrophils Relative %: 50 %
Platelets: 69 10*3/uL — ABNORMAL LOW (ref 150–400)
RBC: 3.2 MIL/uL — ABNORMAL LOW (ref 3.87–5.11)
RDW: 18.3 % — ABNORMAL HIGH (ref 11.5–15.5)
WBC: 2.1 10*3/uL — ABNORMAL LOW (ref 4.0–10.5)
nRBC: 1 % — ABNORMAL HIGH (ref 0.0–0.2)

## 2018-06-04 LAB — CMP (CANCER CENTER ONLY)
ALT: 40 U/L (ref 0–44)
AST: 28 U/L (ref 15–41)
Albumin: 3.9 g/dL (ref 3.5–5.0)
Alkaline Phosphatase: 46 U/L (ref 38–126)
Anion gap: 9 (ref 5–15)
BUN: 23 mg/dL (ref 8–23)
CO2: 26 mmol/L (ref 22–32)
Calcium: 9.2 mg/dL (ref 8.9–10.3)
Chloride: 107 mmol/L (ref 98–111)
Creatinine: 0.9 mg/dL (ref 0.44–1.00)
GFR, Est AFR Am: >60 mL/min (ref >60)
GFR, Estimated: 60 mL/min — ABNORMAL LOW (ref >60)
Glucose, Bld: 107 mg/dL — ABNORMAL HIGH (ref 70–99)
Potassium: 4 mmol/L (ref 3.5–5.1)
Sodium: 142 mmol/L (ref 135–145)
Total Bilirubin: 1.3 mg/dL — ABNORMAL HIGH (ref 0.3–1.2)
Total Protein: 6.8 g/dL (ref 6.5–8.1)

## 2018-06-04 MED ORDER — PREDNISONE 10 MG PO TABS: 30.0000 mg | ORAL_TABLET | Freq: Every day | ORAL | 1 refills | Status: DC

## 2018-06-05 ENCOUNTER — Telehealth: Payer: Self-pay | Admitting: Hematology and Oncology

## 2018-06-05 ENCOUNTER — Encounter: Payer: Self-pay | Admitting: Hematology and Oncology

## 2018-06-05 NOTE — Telephone Encounter (Signed)
Tried to reach regarding schedule °

## 2018-06-05 NOTE — Progress Notes (Signed)
[place] OFFICE PROGRESS NOTE  [name], MD  ASSESSMENT & PLAN:  Pancytopenia, acquired [facility]) Unfortunately, she is developing progressive pancytopenia I felt that her leukopenia has an autoimmune component It typically recur when she presented with hives I recommend resumption of prednisone therapy Given her age and predisposition for excessive fluid retention, I will start her on modest dose of prednisone 30 mg daily We discussed neutropenic precaution She will continue Promacta for thrombocytopenia I will see her next week for further follow-up  Hives The recurrent hives are likely autoimmune in nature She is symptomatic I recommend low-dose prednisone therapy and she agreed  Essential hypertension Her blood pressure control is borderline satisfactory She is gaining weight due to difficulties with compliance with low-salt intake We discussed the importance of dietary modification For now, she will continue current prescription blood pressure medicine and diuretic therapy I will reassess next week   No orders of the defined types were placed in this encounter.   INTERVAL HISTORY: Claudia Moore 85 y.o. female returns for further follow-up She provided documentation of her blood pressure and weight over the last few weeks Blood pressure has been stable but the weight is slightly trending up She continues to have significant leg swelling She has developed hives over 7 days ago, diffuse and itchy in nature She denies recent fever or chills She is not able to see her dermatologist due to the pandemic restriction. The patient denies any recent signs or symptoms of bleeding such as spontaneous epistaxis, hematuria or hematochezia.  SUMMARY OF HEMATOLOGIC HISTORY:  She was last seen in 2017, after referral for abnormal CBC Her total white blood cell count usually range slightly higher than upper limits of normal, between 11.5-12.9  With associated mild  thrombocytopenia. Differential confirm mild neutrophilia She was recommend observation only She was subsequently referred back to me in August 2019 due to progressive thrombocytopenia. CT scan showed no evidence of lymphoma.  She has responded well to low-dose prednisone therapy that was prescribed to treat her hives On 10/03/2017, she received 1 dose of IVIG with no response to therapy.  She also developed serum sickness after that. On October 25, 2017, she started on Promacta 25 mg daily along with prednisone taper On November 12, 2017, prednisone dose is reduced to 10 mg alternate with 5 mg every other day along with Promacta 25 mg daily On November 26, 2017, prednisone dose is reduced to 5 mg alternate with 0 mg every other day along with Promacta 25 mg daily and prednisone was subsequently discontinued On January 02, 2018, the dose of Promacta is increased to 50 mg daily On 04/05/2018, she had stopped prednisone therapy and maintained on Promacta 50 mg daily On 06/04/2018, she resumed taking prednisone 30 mg daily  I have reviewed the past medical history, past surgical history, social history and family history with the patient and they are unchanged from previous note.  ALLERGIES:  is allergic to pine; celebrex [celecoxib]; and zyrtec [cetirizine].  MEDICATIONS:  Current Outpatient Medications  Medication Sig Dispense Refill  . Cholecalciferol (VITAMIN D) 50 MCG (2000 UT) tablet Take 2,000 Units by mouth daily.    Marland Kitchen escitalopram (LEXAPRO) 10 MG tablet Take 10 mg by mouth daily.  3  . furosemide (LASIX) 40 MG tablet TAKE 1 TABLET BY MOUTH EVERY DAY 30 tablet 1  . levothyroxine (SYNTHROID, LEVOTHROID) 100 MCG tablet Take 100 mcg by mouth daily before breakfast.    . Multiple Vitamins-Minerals (MULTIVITAMIN GUMMIES  ADULT) CHEW Chew 1 tablet by mouth at bedtime.    . predniSONE (DELTASONE) 10 MG tablet Take 3 tablets (30 mg total) by mouth daily with breakfast. 90 tablet 1  . PROMACTA 50 MG  tablet TAKE 1 TABLET (50 MG TOTAL) BY MOUTH DAILY. INCREASED DOSE DUE TO PROGRESSIVE THROMBOCYTOPENIA 30 tablet 1  . spironolactone (ALDACTONE) 25 MG tablet TAKE 1 TABLET BY MOUTH EVERY DAY 30 tablet 1   No current facility-administered medications for this visit.      REVIEW OF SYSTEMS:   Constitutional: Denies fevers, chills or night sweats Eyes: Denies blurriness of vision Ears, nose, mouth, throat, and face: Denies mucositis or sore throat Respiratory: Denies cough, dyspnea or wheezes Cardiovascular: Denies palpitation, chest discomfort  Gastrointestinal:  Denies nausea, heartburn or change in bowel habits Lymphatics: Denies new lymphadenopathy or easy bruising Neurological:Denies numbness, tingling or new weaknesses Behavioral/Psych: Mood is stable, no new changes  All other systems were reviewed with the patient and are negative.  PHYSICAL EXAMINATION: ECOG PERFORMANCE STATUS: 1 - Symptomatic but completely ambulatory  Vitals:   06/04/18 1110  BP: (!) 147/60  Pulse: 70  Resp: 18  Temp: 97.6 F (36.4 C)  SpO2: 100%   Filed Weights   06/04/18 1110  Weight: 175 lb (79.4 kg)    GENERAL:alert, no distress and comfortable SKIN: She has diffuse hives throughout HEART: She has moderate bilateral lower extremity edema ABDOMEN:abdomen soft, non-tender and normal bowel sounds Musculoskeletal:no cyanosis of digits and no clubbing  NEURO: alert & oriented x 3 with fluent speech, no focal motor/sensory deficits  LABORATORY DATA:  I have reviewed the data as listed     Component Value Date/Time   NA 142 06/04/2018 1022   K 4.0 06/04/2018 1022   CL 107 06/04/2018 1022   CO2 26 06/04/2018 1022   GLUCOSE 107 (H) 06/04/2018 1022   BUN 23 06/04/2018 1022   CREATININE 0.90 06/04/2018 1022   CALCIUM 9.2 06/04/2018 1022   PROT 6.8 06/04/2018 1022   ALBUMIN 3.9 06/04/2018 1022   AST 28 06/04/2018 1022   ALT 40 06/04/2018 1022   ALKPHOS 46 06/04/2018 1022   BILITOT 1.3 (H)  06/04/2018 1022   GFRNONAA 60 (L) 06/04/2018 1022   GFRAA >60 06/04/2018 1022    No results found for: SPEP, UPEP  Lab Results  Component Value Date   WBC 2.1 (L) 06/04/2018   NEUTROABS 1.1 (L) 06/04/2018   HGB 11.0 (L) 06/04/2018   HCT 33.0 (L) 06/04/2018   MCV 103.1 (H) 06/04/2018   PLT 69 (L) 06/04/2018      Chemistry      Component Value Date/Time   NA 142 06/04/2018 1022   K 4.0 06/04/2018 1022   CL 107 06/04/2018 1022   CO2 26 06/04/2018 1022   BUN 23 06/04/2018 1022   CREATININE 0.90 06/04/2018 1022      Component Value Date/Time   CALCIUM 9.2 06/04/2018 1022   ALKPHOS 46 06/04/2018 1022   AST 28 06/04/2018 1022   ALT 40 06/04/2018 1022   BILITOT 1.3 (H) 06/04/2018 1022      I spent 15 minutes counseling the patient face to face. The total time spent in the appointment was 20 minutes and more than 50% was on counseling.   All questions were answered. The patient knows to call the clinic with any problems, questions or concerns. No barriers to learning was detected.    Heath Lark, MD 5/6/20209:13 AM

## 2018-06-05 NOTE — Assessment & Plan Note (Signed)
The recurrent hives are likely autoimmune in nature She is symptomatic I recommend low-dose prednisone therapy and she agreed

## 2018-06-05 NOTE — Assessment & Plan Note (Signed)
Unfortunately, she is developing progressive pancytopenia I felt that her leukopenia has an autoimmune component It typically recur when she presented with hives I recommend resumption of prednisone therapy Given her age and predisposition for excessive fluid retention, I will start her on modest dose of prednisone 30 mg daily We discussed neutropenic precaution She will continue Promacta for thrombocytopenia I will see her next week for further follow-up

## 2018-06-05 NOTE — Assessment & Plan Note (Signed)
Her blood pressure control is borderline satisfactory She is gaining weight due to difficulties with compliance with low-salt intake We discussed the importance of dietary modification For now, she will continue current prescription blood pressure medicine and diuretic therapy I will reassess next week

## 2018-06-07 ENCOUNTER — Other Ambulatory Visit: Payer: Self-pay | Admitting: Hematology and Oncology

## 2018-06-10 MED FILL — PROMACTA 50 MG TABLET: 50 | 30 days supply | Qty: 30 | Fill #1

## 2018-06-11 ENCOUNTER — Inpatient Hospital Stay (HOSPITAL_BASED_OUTPATIENT_CLINIC_OR_DEPARTMENT_OTHER): Payer: Medicare Other | Admitting: Hematology and Oncology

## 2018-06-11 ENCOUNTER — Other Ambulatory Visit: Payer: Self-pay

## 2018-06-11 ENCOUNTER — Inpatient Hospital Stay: Payer: Medicare Other

## 2018-06-11 ENCOUNTER — Encounter: Payer: Self-pay | Admitting: Hematology and Oncology

## 2018-06-11 DIAGNOSIS — D693 Immune thrombocytopenic purpura: Secondary | ICD-10-CM

## 2018-06-11 DIAGNOSIS — I10 Essential (primary) hypertension: Secondary | ICD-10-CM

## 2018-06-11 DIAGNOSIS — D61818 Other pancytopenia: Secondary | ICD-10-CM | POA: Diagnosis not present

## 2018-06-11 DIAGNOSIS — L509 Urticaria, unspecified: Secondary | ICD-10-CM

## 2018-06-11 DIAGNOSIS — M1A00X0 Idiopathic chronic gout, unspecified site, without tophus (tophi): Secondary | ICD-10-CM

## 2018-06-11 DIAGNOSIS — R600 Localized edema: Secondary | ICD-10-CM

## 2018-06-11 DIAGNOSIS — C44309 Unspecified malignant neoplasm of skin of other parts of face: Secondary | ICD-10-CM | POA: Diagnosis not present

## 2018-06-11 DIAGNOSIS — K760 Fatty (change of) liver, not elsewhere classified: Secondary | ICD-10-CM

## 2018-06-11 LAB — CBC WITH DIFFERENTIAL/PLATELET
Abs Immature Granulocytes: 0.02 10*3/uL (ref 0.00–0.07)
Basophils Absolute: 0 10*3/uL (ref 0.0–0.1)
Basophils Relative: 0 %
Eosinophils Absolute: 0 10*3/uL (ref 0.0–0.5)
Eosinophils Relative: 1 %
HCT: 35.6 % — ABNORMAL LOW (ref 36.0–46.0)
Hemoglobin: 12 g/dL (ref 12.0–15.0)
Immature Granulocytes: 1 %
Lymphocytes Relative: 60 %
Lymphs Abs: 1.9 10*3/uL (ref 0.7–4.0)
MCH: 34.6 pg — ABNORMAL HIGH (ref 26.0–34.0)
MCHC: 33.7 g/dL (ref 30.0–36.0)
MCV: 102.6 fL — ABNORMAL HIGH (ref 80.0–100.0)
Monocytes Absolute: 0.1 10*3/uL (ref 0.1–1.0)
Monocytes Relative: 4 %
Neutro Abs: 1.1 10*3/uL — ABNORMAL LOW (ref 1.7–7.7)
Neutrophils Relative %: 34 %
Platelets: 80 10*3/uL — ABNORMAL LOW (ref 150–400)
RBC: 3.47 MIL/uL — ABNORMAL LOW (ref 3.87–5.11)
RDW: 18.6 % — ABNORMAL HIGH (ref 11.5–15.5)
WBC: 3.1 10*3/uL — ABNORMAL LOW (ref 4.0–10.5)
nRBC: 1 % — ABNORMAL HIGH (ref 0.0–0.2)

## 2018-06-11 LAB — COMPREHENSIVE METABOLIC PANEL
ALT: 27 U/L (ref 0–44)
AST: 15 U/L (ref 15–41)
Albumin: 4.1 g/dL (ref 3.5–5.0)
Alkaline Phosphatase: 42 U/L (ref 38–126)
Anion gap: 9 (ref 5–15)
BUN: 24 mg/dL — ABNORMAL HIGH (ref 8–23)
CO2: 29 mmol/L (ref 22–32)
Calcium: 9.3 mg/dL (ref 8.9–10.3)
Chloride: 104 mmol/L (ref 98–111)
Creatinine, Ser: 0.91 mg/dL (ref 0.44–1.00)
GFR calc Af Amer: >60 mL/min (ref >60)
GFR calc non Af Amer: 59 mL/min — ABNORMAL LOW (ref >60)
Glucose, Bld: 97 mg/dL (ref 70–99)
Potassium: 4.4 mmol/L (ref 3.5–5.1)
Sodium: 142 mmol/L (ref 135–145)
Total Bilirubin: 0.9 mg/dL (ref 0.3–1.2)
Total Protein: 6.9 g/dL (ref 6.5–8.1)

## 2018-06-11 MED ORDER — PREDNISONE 10 MG PO TABS: 20.0000 mg | ORAL_TABLET | Freq: Every day | ORAL | 1 refills | Status: DC

## 2018-06-11 NOTE — Assessment & Plan Note (Signed)
Pancytopenia has improved due to autoimmune condition associated with improved control with prednisone She is mildly neutropenic but not symptomatic We discussed neutropenic precaution I plan to taper prednisone to 20 mg and see her back next week

## 2018-06-11 NOTE — Assessment & Plan Note (Signed)
Her ITP has improved with recent reintroduction of prednisone She will continue on prednisone taper along with Promacta

## 2018-06-11 NOTE — Assessment & Plan Note (Signed)
Her blood pressure is intermittently elevated likely due to fluid retention with the addition of prednisone She will continue close monitoring of blood pressure twice a day and we discussed dietary change and lifestyle modification

## 2018-06-11 NOTE — Assessment & Plan Note (Signed)
Her hives have almost completely resolved Recommend prednisone taper as above to 20 mg

## 2018-06-11 NOTE — Progress Notes (Signed)
[place] OFFICE PROGRESS NOTE  Claudia Small, MD  ASSESSMENT & PLAN:  Chronic ITP (idiopathic thrombocytopenia) (HCC) Her ITP has improved with recent reintroduction of prednisone She will continue on prednisone taper along with Promacta  Pancytopenia, acquired ([place]) Pancytopenia has improved due to autoimmune condition associated with improved control with prednisone She is mildly neutropenic but not symptomatic We discussed neutropenic precaution I plan to taper prednisone to 20 mg and see her back next week  Hives Her hives have almost completely resolved Recommend prednisone taper as above to 20 mg  Essential hypertension Her blood pressure is intermittently elevated likely due to fluid retention with the addition of prednisone She will continue close monitoring of blood pressure twice a day and we discussed dietary change and lifestyle modification   No orders of the defined types were placed in this encounter.   INTERVAL HISTORY: Claudia Moore 85 y.o. female returns for further evaluation and follow-up Since last time I saw her, his skin is hives has almost completely resolved Her leg swelling is stable She has not gained too much weight with reintroduction of prednisone No recent infection, fever or chills The patient denies any recent signs or symptoms of bleeding such as spontaneous epistaxis, hematuria or hematochezia.  SUMMARY OF HEMATOLOGIC HISTORY:  She was last seen in 2017, after referral for abnormal CBC Her total white blood cell count usually range slightly higher than upper limits of normal, between 11.5-12.9  With associated mild thrombocytopenia. Differential confirm mild neutrophilia She was recommend observation only She was subsequently referred back to me in August 2019 due to progressive thrombocytopenia. CT scan showed no evidence of lymphoma.  She has responded well to low-dose prednisone therapy that was prescribed to treat her  hives On 10/03/2017, she received 1 dose of IVIG with no response to therapy.  She also developed serum sickness after that. On October 25, 2017, she started on Promacta 25 mg daily along with prednisone taper On November 12, 2017, prednisone dose is reduced to 10 mg alternate with 5 mg every other day along with Promacta 25 mg daily On November 26, 2017, prednisone dose is reduced to 5 mg alternate with 0 mg every other day along with Promacta 25 mg daily and prednisone was subsequently discontinued On January 02, 2018, the dose of Promacta is increased to 50 mg daily On 04/05/2018, she had stopped prednisone therapy and maintained on Promacta 50 mg daily On 06/04/2018, she resumed taking prednisone 30 mg daily On 06/11/2018, prednisone is reduced to 20 mg daily along with Promacta  I have reviewed the past medical history, past surgical history, social history and family history with the patient and they are unchanged from previous note.  ALLERGIES:  is allergic to pine; celebrex [celecoxib]; and zyrtec [cetirizine].  MEDICATIONS:  Current Outpatient Medications  Medication Sig Dispense Refill  . Cholecalciferol (VITAMIN D) 50 MCG (2000 UT) tablet Take 2,000 Units by mouth daily.    Marland Kitchen escitalopram (LEXAPRO) 10 MG tablet Take 10 mg by mouth daily.  3  . furosemide (LASIX) 40 MG tablet TAKE 1 TABLET BY MOUTH EVERY DAY 30 tablet 1  . levothyroxine (SYNTHROID, LEVOTHROID) 100 MCG tablet Take 100 mcg by mouth daily before breakfast.    . Multiple Vitamins-Minerals (MULTIVITAMIN GUMMIES ADULT) CHEW Chew 1 tablet by mouth at bedtime.    . predniSONE (DELTASONE) 10 MG tablet Take 2 tablets (20 mg total) by mouth daily with breakfast. 90 tablet 1  . PROMACTA 50  MG tablet TAKE 1 TABLET (50 MG TOTAL) BY MOUTH DAILY. INCREASED DOSE DUE TO PROGRESSIVE THROMBOCYTOPENIA 30 tablet 1  . spironolactone (ALDACTONE) 25 MG tablet TAKE 1 TABLET BY MOUTH EVERY DAY 30 tablet 1   No current facility-administered  medications for this visit.      REVIEW OF SYSTEMS:   Constitutional: Denies fevers, chills or night sweats Eyes: Denies blurriness of vision Ears, nose, mouth, throat, and face: Denies mucositis or sore throat Respiratory: Denies cough, dyspnea or wheezes Cardiovascular: Denies palpitation, chest discomfort Gastrointestinal:  Denies nausea, heartburn or change in bowel habits Lymphatics: Denies new lymphadenopathy or easy bruising Neurological:Denies numbness, tingling or new weaknesses Behavioral/Psych: Mood is stable, no new changes  All other systems were reviewed with the patient and are negative.  PHYSICAL EXAMINATION: ECOG PERFORMANCE STATUS: 1 - Symptomatic but completely ambulatory  Vitals:   06/11/18 1056  BP: (!) 162/60  Pulse: 66  Resp: 18  Temp: 98.5 F (36.9 C)  SpO2: 100%   Filed Weights   06/11/18 1056  Weight: 174 lb 12.8 oz (79.3 kg)    GENERAL:alert, no distress and comfortable SKIN: Her skin hives has almost completely resolved HEART: Noted mild bilateral lower extremity edema ABDOMEN:abdomen soft, non-tender and normal bowel sounds Musculoskeletal:no cyanosis of digits and no clubbing  NEURO: alert & oriented x 3 with fluent speech, no focal motor/sensory deficits  LABORATORY DATA:  I have reviewed the data as listed     Component Value Date/Time   NA 142 06/11/2018 1017   K 4.4 06/11/2018 1017   CL 104 06/11/2018 1017   CO2 29 06/11/2018 1017   GLUCOSE 97 06/11/2018 1017   BUN 24 (H) 06/11/2018 1017   CREATININE 0.91 06/11/2018 1017   CREATININE 0.90 06/04/2018 1022   CALCIUM 9.3 06/11/2018 1017   PROT 6.9 06/11/2018 1017   ALBUMIN 4.1 06/11/2018 1017   AST 15 06/11/2018 1017   AST 28 06/04/2018 1022   ALT 27 06/11/2018 1017   ALT 40 06/04/2018 1022   ALKPHOS 42 06/11/2018 1017   BILITOT 0.9 06/11/2018 1017   BILITOT 1.3 (H) 06/04/2018 1022   GFRNONAA 59 (L) 06/11/2018 1017   GFRNONAA 60 (L) 06/04/2018 1022   GFRAA >60 06/11/2018  1017   GFRAA >60 06/04/2018 1022    No results found for: SPEP, UPEP  Lab Results  Component Value Date   WBC 3.1 (L) 06/11/2018   NEUTROABS 1.1 (L) 06/11/2018   HGB 12.0 06/11/2018   HCT 35.6 (L) 06/11/2018   MCV 102.6 (H) 06/11/2018   PLT 80 (L) 06/11/2018      Chemistry      Component Value Date/Time   NA 142 06/11/2018 1017   K 4.4 06/11/2018 1017   CL 104 06/11/2018 1017   CO2 29 06/11/2018 1017   BUN 24 (H) 06/11/2018 1017   CREATININE 0.91 06/11/2018 1017   CREATININE 0.90 06/04/2018 1022      Component Value Date/Time   CALCIUM 9.3 06/11/2018 1017   ALKPHOS 42 06/11/2018 1017   AST 15 06/11/2018 1017   AST 28 06/04/2018 1022   ALT 27 06/11/2018 1017   ALT 40 06/04/2018 1022   BILITOT 0.9 06/11/2018 1017   BILITOT 1.3 (H) 06/04/2018 1022       I spent 15 minutes counseling the patient face to face. The total time spent in the appointment was 20 minutes and more than 50% was on counseling.   All questions were answered. The patient knows to  call the clinic with any problems, questions or concerns. No barriers to learning was detected.    Heath Lark, MD 5/12/202011:18 AM

## 2018-06-13 ENCOUNTER — Telehealth: Payer: Self-pay | Admitting: Hematology and Oncology

## 2018-06-13 NOTE — Telephone Encounter (Signed)
Tried to reach regarding schedule °

## 2018-06-14 ENCOUNTER — Telehealth: Payer: Self-pay

## 2018-06-14 ENCOUNTER — Other Ambulatory Visit: Payer: Self-pay | Admitting: Hematology and Oncology

## 2018-06-14 NOTE — Telephone Encounter (Signed)
Spoke with pt by phone to confirm appointments for 5/18

## 2018-06-17 ENCOUNTER — Other Ambulatory Visit: Payer: Self-pay

## 2018-06-17 ENCOUNTER — Inpatient Hospital Stay: Payer: Medicare Other

## 2018-06-17 ENCOUNTER — Inpatient Hospital Stay (HOSPITAL_BASED_OUTPATIENT_CLINIC_OR_DEPARTMENT_OTHER): Payer: Medicare Other | Admitting: Hematology and Oncology

## 2018-06-17 DIAGNOSIS — L509 Urticaria, unspecified: Secondary | ICD-10-CM

## 2018-06-17 DIAGNOSIS — R600 Localized edema: Secondary | ICD-10-CM

## 2018-06-17 DIAGNOSIS — K760 Fatty (change of) liver, not elsewhere classified: Secondary | ICD-10-CM

## 2018-06-17 DIAGNOSIS — D693 Immune thrombocytopenic purpura: Secondary | ICD-10-CM

## 2018-06-17 DIAGNOSIS — C44309 Unspecified malignant neoplasm of skin of other parts of face: Secondary | ICD-10-CM | POA: Diagnosis not present

## 2018-06-17 DIAGNOSIS — M1A00X0 Idiopathic chronic gout, unspecified site, without tophus (tophi): Secondary | ICD-10-CM

## 2018-06-17 DIAGNOSIS — D61818 Other pancytopenia: Secondary | ICD-10-CM | POA: Diagnosis not present

## 2018-06-17 DIAGNOSIS — I10 Essential (primary) hypertension: Secondary | ICD-10-CM | POA: Diagnosis not present

## 2018-06-17 LAB — CBC WITH DIFFERENTIAL/PLATELET
Abs Immature Granulocytes: 0.03 10*3/uL (ref 0.00–0.07)
Basophils Absolute: 0 10*3/uL (ref 0.0–0.1)
Basophils Relative: 0 %
Eosinophils Absolute: 0 10*3/uL (ref 0.0–0.5)
Eosinophils Relative: 1 %
HCT: 37 % (ref 36.0–46.0)
Hemoglobin: 12.8 g/dL (ref 12.0–15.0)
Immature Granulocytes: 1 %
Lymphocytes Relative: 48 %
Lymphs Abs: 1.5 10*3/uL (ref 0.7–4.0)
MCH: 35.1 pg — ABNORMAL HIGH (ref 26.0–34.0)
MCHC: 34.6 g/dL (ref 30.0–36.0)
MCV: 101.4 fL — ABNORMAL HIGH (ref 80.0–100.0)
Monocytes Absolute: 0.2 10*3/uL (ref 0.1–1.0)
Monocytes Relative: 6 %
Neutro Abs: 1.3 10*3/uL — ABNORMAL LOW (ref 1.7–7.7)
Neutrophils Relative %: 44 %
Platelets: 72 10*3/uL — ABNORMAL LOW (ref 150–400)
RBC: 3.65 MIL/uL — ABNORMAL LOW (ref 3.87–5.11)
RDW: 18.1 % — ABNORMAL HIGH (ref 11.5–15.5)
WBC: 3.1 10*3/uL — ABNORMAL LOW (ref 4.0–10.5)
nRBC: 1 % — ABNORMAL HIGH (ref 0.0–0.2)

## 2018-06-17 LAB — COMPREHENSIVE METABOLIC PANEL
ALT: 30 U/L (ref 0–44)
AST: 17 U/L (ref 15–41)
Albumin: 4.2 g/dL (ref 3.5–5.0)
Alkaline Phosphatase: 41 U/L (ref 38–126)
Anion gap: 10 (ref 5–15)
BUN: 24 mg/dL — ABNORMAL HIGH (ref 8–23)
CO2: 30 mmol/L (ref 22–32)
Calcium: 9.5 mg/dL (ref 8.9–10.3)
Chloride: 99 mmol/L (ref 98–111)
Creatinine, Ser: 0.89 mg/dL (ref 0.44–1.00)
GFR calc Af Amer: >60 mL/min (ref >60)
GFR calc non Af Amer: >60 mL/min (ref >60)
Glucose, Bld: 107 mg/dL — ABNORMAL HIGH (ref 70–99)
Potassium: 3.7 mmol/L (ref 3.5–5.1)
Sodium: 139 mmol/L (ref 135–145)
Total Bilirubin: 0.9 mg/dL (ref 0.3–1.2)
Total Protein: 7.2 g/dL (ref 6.5–8.1)

## 2018-06-18 ENCOUNTER — Encounter: Payer: Self-pay | Admitting: Hematology and Oncology

## 2018-06-18 NOTE — Assessment & Plan Note (Signed)
Her hives have almost completely resolved However, I get a sense that whenever we attempt to reduce her prednisone dose significantly, her highs will flareup For now, I recommend she stay on the same dose for the next 2 weeks She agreed

## 2018-06-18 NOTE — Progress Notes (Signed)
[facility] OFFICE PROGRESS NOTE  Patient Care Team: [name], MD as PCP - General (Family Medicine)  ASSESSMENT & PLAN:  Chronic ITP (idiopathic thrombocytopenia) (HCC) Her ITP has improved with recent reintroduction of prednisone She will continue on prednisone taper along with Promacta  Hives Her hives have almost completely resolved However, I get a sense that whenever we attempt to reduce her prednisone dose significantly, her highs will flareup For now, I recommend she stay on the same dose for the next 2 weeks She agreed  Essential hypertension Her blood pressure control has been satisfactory She will continue twice a day blood pressure monitoring  Bilateral leg edema She has persistent leg edema Her weight fluctuated at home, typically around 170 Her renal function is stable. I plan to see her again in 2 weeks for further follow-up  Cancer of skin of external cheek Her skin on her cheek is healing well She will continue close follow-up with dermatologist   No orders of the defined types were placed in this encounter.   INTERVAL HISTORY: Please see below for problem oriented charting. She returns for further follow-up with plan for prednisone taper She found that her skin rash is intermittently itchy especially after hot shower Her leg swelling is stable According to her blood pressure monitoring at home, her blood pressure fluctuated between 120 to 150 Her weight fluctuated around 170 pounds She denies recent fever or chills The scar on her left cheek has healed well. The patient denies any recent signs or symptoms of bleeding such as spontaneous epistaxis, hematuria or hematochezia.  SUMMARY OF ONCOLOGIC HISTORY:  She was last seen in 2017, after referral for abnormal CBC Her total white blood cell count usually range slightly higher than upper limits of normal, between 11.5-12.9  With associated mild thrombocytopenia. Differential confirm  mild neutrophilia She was recommend observation only She was subsequently referred back to me in August 2019 due to progressive thrombocytopenia. CT scan showed no evidence of lymphoma.  She has responded well to low-dose prednisone therapy that was prescribed to treat her hives On 10/03/2017, she received 1 dose of IVIG with no response to therapy.  She also developed serum sickness after that. On October 25, 2017, she started on Promacta 25 mg daily along with prednisone taper On November 12, 2017, prednisone dose is reduced to 10 mg alternate with 5 mg every other day along with Promacta 25 mg daily On November 26, 2017, prednisone dose is reduced to 5 mg alternate with 0 mg every other day along with Promacta 25 mg daily and prednisone was subsequently discontinued On January 02, 2018, the dose of Promacta is increased to 50 mg daily On 04/05/2018, she had stopped prednisone therapy and maintained on Promacta 50 mg daily On 06/04/2018, she resumed taking prednisone 30 mg daily On 06/11/2018, prednisone is reduced to 20 mg daily along with Promacta  REVIEW OF SYSTEMS:   Constitutional: Denies fevers, chills or abnormal weight loss Eyes: Denies blurriness of vision Ears, nose, mouth, throat, and face: Denies mucositis or sore throat Respiratory: Denies cough, dyspnea or wheezes Cardiovascular: Denies palpitation, chest discomfort Gastrointestinal:  Denies nausea, heartburn or change in bowel habits Lymphatics: Denies new lymphadenopathy or easy bruising Neurological:Denies numbness, tingling or new weaknesses Behavioral/Psych: Mood is stable, no new changes  All other systems were reviewed with the patient and are negative.  I have reviewed the past medical history, past surgical history, social history and family history with the patient and  they are unchanged from previous note.  ALLERGIES:  is allergic to pine; celebrex [celecoxib]; and zyrtec [cetirizine].  MEDICATIONS:  Current  Outpatient Medications  Medication Sig Dispense Refill  . Cholecalciferol (VITAMIN D) 50 MCG (2000 UT) tablet Take 2,000 Units by mouth daily.    Marland Kitchen escitalopram (LEXAPRO) 10 MG tablet Take 10 mg by mouth daily.  3  . furosemide (LASIX) 40 MG tablet TAKE 1 TABLET BY MOUTH EVERY DAY 30 tablet 1  . levothyroxine (SYNTHROID, LEVOTHROID) 100 MCG tablet Take 100 mcg by mouth daily before breakfast.    . Multiple Vitamins-Minerals (MULTIVITAMIN GUMMIES ADULT) CHEW Chew 1 tablet by mouth at bedtime.    . predniSONE (DELTASONE) 10 MG tablet Take 2 tablets (20 mg total) by mouth daily with breakfast. 90 tablet 1  . PROMACTA 50 MG tablet TAKE 1 TABLET (50 MG TOTAL) BY MOUTH DAILY. INCREASED DOSE DUE TO PROGRESSIVE THROMBOCYTOPENIA 30 tablet 1  . spironolactone (ALDACTONE) 25 MG tablet TAKE 1 TABLET BY MOUTH EVERY DAY 30 tablet 1   No current facility-administered medications for this visit.     PHYSICAL EXAMINATION: ECOG PERFORMANCE STATUS: 1 - Symptomatic but completely ambulatory  Vitals:   06/17/18 1050  BP: 136/70  Pulse: 78  Resp: 18  Temp: 98.5 F (36.9 C)  SpO2: 99%   Filed Weights   06/17/18 1050  Weight: 173 lb 3.2 oz (78.6 kg)    GENERAL:alert, no distress and comfortable SKIN: She has rare small areas of hives on her abdomen.  The incision wound on her left cheek has healed HEART: Noted moderate bilateral lower extremity edema Musculoskeletal:no cyanosis of digits and no clubbing  NEURO: alert & oriented x 3 with fluent speech, no focal motor/sensory deficits  LABORATORY DATA:  I have reviewed the data as listed    Component Value Date/Time   NA 139 06/17/2018 1038   K 3.7 06/17/2018 1038   CL 99 06/17/2018 1038   CO2 30 06/17/2018 1038   GLUCOSE 107 (H) 06/17/2018 1038   BUN 24 (H) 06/17/2018 1038   CREATININE 0.89 06/17/2018 1038   CREATININE 0.90 06/04/2018 1022   CALCIUM 9.5 06/17/2018 1038   PROT 7.2 06/17/2018 1038   ALBUMIN 4.2 06/17/2018 1038   AST 17  06/17/2018 1038   AST 28 06/04/2018 1022   ALT 30 06/17/2018 1038   ALT 40 06/04/2018 1022   ALKPHOS 41 06/17/2018 1038   BILITOT 0.9 06/17/2018 1038   BILITOT 1.3 (H) 06/04/2018 1022   GFRNONAA >60 06/17/2018 1038   GFRNONAA 60 (L) 06/04/2018 1022   GFRAA >60 06/17/2018 1038   GFRAA >60 06/04/2018 1022    No results found for: SPEP, UPEP  Lab Results  Component Value Date   WBC 3.1 (L) 06/17/2018   NEUTROABS 1.3 (L) 06/17/2018   HGB 12.8 06/17/2018   HCT 37.0 06/17/2018   MCV 101.4 (H) 06/17/2018   PLT 72 (L) 06/17/2018      Chemistry      Component Value Date/Time   NA 139 06/17/2018 1038   K 3.7 06/17/2018 1038   CL 99 06/17/2018 1038   CO2 30 06/17/2018 1038   BUN 24 (H) 06/17/2018 1038   CREATININE 0.89 06/17/2018 1038   CREATININE 0.90 06/04/2018 1022      Component Value Date/Time   CALCIUM 9.5 06/17/2018 1038   ALKPHOS 41 06/17/2018 1038   AST 17 06/17/2018 1038   AST 28 06/04/2018 1022   ALT 30 06/17/2018 1038   ALT  40 06/04/2018 1022   BILITOT 0.9 06/17/2018 1038   BILITOT 1.3 (H) 06/04/2018 1022      All questions were answered. The patient knows to call the clinic with any problems, questions or concerns. No barriers to learning was detected.  I spent 15 minutes counseling the patient face to face. The total time spent in the appointment was 20 minutes and more than 50% was on counseling and review of test results  Heath Lark, MD 06/18/2018 11:03 AM

## 2018-06-18 NOTE — Assessment & Plan Note (Signed)
Her blood pressure control has been satisfactory She will continue twice a day blood pressure monitoring

## 2018-06-18 NOTE — Assessment & Plan Note (Signed)
Her ITP has improved with recent reintroduction of prednisone She will continue on prednisone taper along with Promacta

## 2018-06-18 NOTE — Assessment & Plan Note (Signed)
She has persistent leg edema Her weight fluctuated at home, typically around 170 Her renal function is stable. I plan to see her again in 2 weeks for further follow-up

## 2018-06-18 NOTE — Assessment & Plan Note (Signed)
Her skin on her cheek is healing well She will continue close follow-up with dermatologist

## 2018-06-19 ENCOUNTER — Telehealth: Payer: Self-pay | Admitting: Hematology and Oncology

## 2018-06-19 NOTE — Telephone Encounter (Signed)
Tried to reach regarding schedule °

## 2018-06-26 ENCOUNTER — Telehealth: Payer: Self-pay

## 2018-06-26 NOTE — Telephone Encounter (Signed)
She called and left a message that she has hives all over. Her appt is 6/2 and she is asking about possibly getting a earlier appt.  Called back and left a message for her to call the office back.

## 2018-06-26 NOTE — Telephone Encounter (Signed)
She called back. Her hives are better this morning, she is taking pictures of hives. She has a episode last night when she took benadryl, the pill got stuck and she had to wait for it to melt. She feels like her throat is swollen and she is afraid to take any more pills. Instructed to call PCP or go to urgent care to be evaluated now. Per Dr. Alvy Bimler, instructed to increase Prednisone to 30 mg daily until seen 6/2 and to call dermatologist and make appt. She verbalized understanding.

## 2018-07-01 ENCOUNTER — Encounter: Payer: Self-pay | Admitting: Hematology and Oncology

## 2018-07-02 ENCOUNTER — Inpatient Hospital Stay: Payer: Medicare Other | Attending: Hematology and Oncology

## 2018-07-02 ENCOUNTER — Encounter: Payer: Self-pay | Admitting: Hematology and Oncology

## 2018-07-02 ENCOUNTER — Inpatient Hospital Stay (HOSPITAL_BASED_OUTPATIENT_CLINIC_OR_DEPARTMENT_OTHER): Payer: Medicare Other | Admitting: Hematology and Oncology

## 2018-07-02 ENCOUNTER — Other Ambulatory Visit: Payer: Self-pay | Admitting: Hematology and Oncology

## 2018-07-02 ENCOUNTER — Other Ambulatory Visit: Payer: Self-pay

## 2018-07-02 DIAGNOSIS — C9500 Acute leukemia of unspecified cell type not having achieved remission: Secondary | ICD-10-CM | POA: Insufficient documentation

## 2018-07-02 DIAGNOSIS — D539 Nutritional anemia, unspecified: Secondary | ICD-10-CM | POA: Insufficient documentation

## 2018-07-02 DIAGNOSIS — D693 Immune thrombocytopenic purpura: Secondary | ICD-10-CM

## 2018-07-02 DIAGNOSIS — D8989 Other specified disorders involving the immune mechanism, not elsewhere classified: Secondary | ICD-10-CM | POA: Insufficient documentation

## 2018-07-02 DIAGNOSIS — M1A00X0 Idiopathic chronic gout, unspecified site, without tophus (tophi): Secondary | ICD-10-CM

## 2018-07-02 DIAGNOSIS — D61818 Other pancytopenia: Secondary | ICD-10-CM | POA: Insufficient documentation

## 2018-07-02 DIAGNOSIS — I10 Essential (primary) hypertension: Secondary | ICD-10-CM | POA: Insufficient documentation

## 2018-07-02 DIAGNOSIS — R600 Localized edema: Secondary | ICD-10-CM | POA: Insufficient documentation

## 2018-07-02 DIAGNOSIS — Z79899 Other long term (current) drug therapy: Secondary | ICD-10-CM | POA: Insufficient documentation

## 2018-07-02 DIAGNOSIS — L509 Urticaria, unspecified: Secondary | ICD-10-CM | POA: Diagnosis not present

## 2018-07-02 DIAGNOSIS — K760 Fatty (change of) liver, not elsewhere classified: Secondary | ICD-10-CM

## 2018-07-02 LAB — COMPREHENSIVE METABOLIC PANEL
ALT: 20 U/L (ref 0–44)
AST: 15 U/L (ref 15–41)
Albumin: 4.1 g/dL (ref 3.5–5.0)
Alkaline Phosphatase: 29 U/L — ABNORMAL LOW (ref 38–126)
Anion gap: 11 (ref 5–15)
BUN: 24 mg/dL — ABNORMAL HIGH (ref 8–23)
CO2: 25 mmol/L (ref 22–32)
Calcium: 8.9 mg/dL (ref 8.9–10.3)
Chloride: 102 mmol/L (ref 98–111)
Creatinine, Ser: 0.99 mg/dL (ref 0.44–1.00)
GFR calc Af Amer: >60 mL/min (ref >60)
GFR calc non Af Amer: 53 mL/min — ABNORMAL LOW (ref >60)
Glucose, Bld: 121 mg/dL — ABNORMAL HIGH (ref 70–99)
Potassium: 4 mmol/L (ref 3.5–5.1)
Sodium: 138 mmol/L (ref 135–145)
Total Bilirubin: 1.1 mg/dL (ref 0.3–1.2)
Total Protein: 6.8 g/dL (ref 6.5–8.1)

## 2018-07-02 LAB — CBC WITH DIFFERENTIAL/PLATELET
Abs Immature Granulocytes: 0.02 10*3/uL (ref 0.00–0.07)
Basophils Absolute: 0 10*3/uL (ref 0.0–0.1)
Basophils Relative: 1 %
Eosinophils Absolute: 0 10*3/uL (ref 0.0–0.5)
Eosinophils Relative: 0 %
HCT: 34.6 % — ABNORMAL LOW (ref 36.0–46.0)
Hemoglobin: 11.8 g/dL — ABNORMAL LOW (ref 12.0–15.0)
Immature Granulocytes: 1 %
Lymphocytes Relative: 44 %
Lymphs Abs: 1 10*3/uL (ref 0.7–4.0)
MCH: 35.1 pg — ABNORMAL HIGH (ref 26.0–34.0)
MCHC: 34.1 g/dL (ref 30.0–36.0)
MCV: 103 fL — ABNORMAL HIGH (ref 80.0–100.0)
Monocytes Absolute: 0.1 10*3/uL (ref 0.1–1.0)
Monocytes Relative: 2 %
Neutro Abs: 1.1 10*3/uL — ABNORMAL LOW (ref 1.7–7.7)
Neutrophils Relative %: 52 %
Platelets: 57 10*3/uL — ABNORMAL LOW (ref 150–400)
RBC: 3.36 MIL/uL — ABNORMAL LOW (ref 3.87–5.11)
RDW: 18.4 % — ABNORMAL HIGH (ref 11.5–15.5)
WBC: 2.2 10*3/uL — ABNORMAL LOW (ref 4.0–10.5)
nRBC: 0.9 % — ABNORMAL HIGH (ref 0.0–0.2)

## 2018-07-02 NOTE — Assessment & Plan Note (Signed)
She continues to have fluctuation of her CBC The patient has a tendency to develop worsening pancytopenia with a flare of this associated skin rash I suspect she has some sort of autoimmune disorder At this point in time, she is steroid dependent With higher dose of prednisone, 30 mg or more, her autoimmune pancytopenia typically improves However, with each attempt a prednisone taper, her pancytopenia is worse For now, I will continue 20 mg of prednisone daily along with Promacta After her visit with dermatologist, I will consider switching her to CellCept with eventual prednisone taper For now, she will continue neutropenic precaution

## 2018-07-02 NOTE — Progress Notes (Signed)
[place] OFFICE PROGRESS NOTE  [name], MD  ASSESSMENT & PLAN:  Pancytopenia, acquired [facility]) She continues to have fluctuation of her CBC The patient has a tendency to develop worsening pancytopenia with a flare of this associated skin rash I suspect she has some sort of autoimmune disorder At this point in time, she is steroid dependent With higher dose of prednisone, 30 mg or more, her autoimmune pancytopenia typically improves However, with each attempt a prednisone taper, her pancytopenia is worse For now, I will continue 20 mg of prednisone daily along with Promacta After her visit with dermatologist, I will consider switching her to CellCept with eventual prednisone taper For now, she will continue neutropenic precaution  Hives She has recent acute flare of her bullous skin rash With higher dose of prednisone, her skin rash and the blisters improve She has numerous pictures to show her dermatologist I recommend close follow-up including skin biopsy to exclude other condition My plan would be eventually start her on immunosuppressant with CellCept and slow prednisone taper.  She agree with the plan of care  Bilateral leg edema She has profound fluid retention while on higher dose of prednisone For now, she will continue furosemide and spironolactone We have extensive discussion about dietary modification   No orders of the defined types were placed in this encounter.   INTERVAL HISTORY: Claudia Moore 85 y.o. female returns for further follow-up She has not made appointment to see her dermatologist She showed me numerous pictures with skin rashes throughout her body At some point, she developed skin blister but those have improved with high-dose prednisone She denies recent bleeding No recent fever or chills No cough She continues to struggle with leg swelling Whenever she made poor dietary choices, she seems to gain a bit more fluid  weight  SUMMARY OF HEMATOLOGIC HISTORY:  She was last seen in 2017, after referral for abnormal CBC Her total white blood cell count usually range slightly higher than upper limits of normal, between 11.5-12.9  With associated mild thrombocytopenia. Differential confirm mild neutrophilia She was recommend observation only She was subsequently referred back to me in August 2019 due to progressive thrombocytopenia. CT scan showed no evidence of lymphoma.  She has responded well to low-dose prednisone therapy that was prescribed to treat her hives On 10/03/2017, she received 1 dose of IVIG with no response to therapy.  She also developed serum sickness after that. On October 25, 2017, she started on Promacta 25 mg daily along with prednisone taper On November 12, 2017, prednisone dose is reduced to 10 mg alternate with 5 mg every other day along with Promacta 25 mg daily On November 26, 2017, prednisone dose is reduced to 5 mg alternate with 0 mg every other day along with Promacta 25 mg daily and prednisone was subsequently discontinued On January 02, 2018, the dose of Promacta is increased to 50 mg daily On 04/05/2018, she had stopped prednisone therapy and maintained on Promacta 50 mg daily On 06/04/2018, she resumed taking prednisone 30 mg daily On 06/11/2018, prednisone is reduced to 20 mg daily along with Promacta  I have reviewed the past medical history, past surgical history, social history and family history with the patient and they are unchanged from previous note.  ALLERGIES:  is allergic to pine; celebrex [celecoxib]; and zyrtec [cetirizine].  MEDICATIONS:  Current Outpatient Medications  Medication Sig Dispense Refill  . Cholecalciferol (VITAMIN D) 50 MCG (2000 UT) tablet Take 2,000 Units by  mouth daily.    Marland Kitchen eltrombopag (PROMACTA) 50 MG tablet Take 1 tablet (50 mg total) by mouth daily. 30 tablet 11  . escitalopram (LEXAPRO) 10 MG tablet Take 10 mg by mouth daily.  3  . furosemide  (LASIX) 40 MG tablet TAKE 1 TABLET BY MOUTH EVERY DAY 30 tablet 1  . levothyroxine (SYNTHROID, LEVOTHROID) 100 MCG tablet Take 100 mcg by mouth daily before breakfast.    . Multiple Vitamins-Minerals (MULTIVITAMIN GUMMIES ADULT) CHEW Chew 1 tablet by mouth at bedtime.    . predniSONE (DELTASONE) 10 MG tablet Take 2 tablets (20 mg total) by mouth daily with breakfast. 90 tablet 1  . spironolactone (ALDACTONE) 25 MG tablet TAKE 1 TABLET BY MOUTH EVERY DAY 30 tablet 1   No current facility-administered medications for this visit.      REVIEW OF SYSTEMS:   Constitutional: Denies fevers, chills or night sweats Eyes: Denies blurriness of vision Ears, nose, mouth, throat, and face: Denies mucositis or sore throat Respiratory: Denies cough, dyspnea or wheezes Cardiovascular: Denies palpitation, chest discomfort or lower extremity swelling Gastrointestinal:  Denies nausea, heartburn or change in bowel habits Lymphatics: Denies new lymphadenopathy  Neurological:Denies numbness, tingling or new weaknesses Behavioral/Psych: Mood is stable, no new changes  All other systems were reviewed with the patient and are negative.  PHYSICAL EXAMINATION: ECOG PERFORMANCE STATUS: 1 - Symptomatic but completely ambulatory  Vitals:   07/02/18 1225  BP: (!) 144/79  Pulse: 87  Resp: 18  Temp: 98.7 F (37.1 C)  SpO2: 98%   Filed Weights   07/02/18 1225  Weight: 175 lb 9.6 oz (79.7 kg)    GENERAL:alert, no distress and comfortable SKIN: She has persistent hives on her abdomen and upper extremity HEART:noted moderate lower extremity edema NEURO: alert & oriented x 3 with fluent speech, no focal motor/sensory deficits  LABORATORY DATA:  I have reviewed the data as listed     Component Value Date/Time   NA 138 07/02/2018 1157   K 4.0 07/02/2018 1157   CL 102 07/02/2018 1157   CO2 25 07/02/2018 1157   GLUCOSE 121 (H) 07/02/2018 1157   BUN 24 (H) 07/02/2018 1157   CREATININE 0.99 07/02/2018 1157    CREATININE 0.90 06/04/2018 1022   CALCIUM 8.9 07/02/2018 1157   PROT 6.8 07/02/2018 1157   ALBUMIN 4.1 07/02/2018 1157   AST 15 07/02/2018 1157   AST 28 06/04/2018 1022   ALT 20 07/02/2018 1157   ALT 40 06/04/2018 1022   ALKPHOS 29 (L) 07/02/2018 1157   BILITOT 1.1 07/02/2018 1157   BILITOT 1.3 (H) 06/04/2018 1022   GFRNONAA 53 (L) 07/02/2018 1157   GFRNONAA 60 (L) 06/04/2018 1022   GFRAA >60 07/02/2018 1157   GFRAA >60 06/04/2018 1022    No results found for: SPEP, UPEP  Lab Results  Component Value Date   WBC 2.2 (L) 07/02/2018   NEUTROABS 1.1 (L) 07/02/2018   HGB 11.8 (L) 07/02/2018   HCT 34.6 (L) 07/02/2018   MCV 103.0 (H) 07/02/2018   PLT 57 (L) 07/02/2018      Chemistry      Component Value Date/Time   NA 138 07/02/2018 1157   K 4.0 07/02/2018 1157   CL 102 07/02/2018 1157   CO2 25 07/02/2018 1157   BUN 24 (H) 07/02/2018 1157   CREATININE 0.99 07/02/2018 1157   CREATININE 0.90 06/04/2018 1022      Component Value Date/Time   CALCIUM 8.9 07/02/2018 1157   ALKPHOS 29 (  L) 07/02/2018 1157   AST 15 07/02/2018 1157   AST 28 06/04/2018 1022   ALT 20 07/02/2018 1157   ALT 40 06/04/2018 1022   BILITOT 1.1 07/02/2018 1157   BILITOT 1.3 (H) 06/04/2018 1022      I spent 15 minutes counseling the patient face to face. The total time spent in the appointment was 20 minutes and more than 50% was on counseling.   All questions were answered. The patient knows to call the clinic with any problems, questions or concerns. No barriers to learning was detected.    Heath Lark, MD 6/2/20202:12 PM

## 2018-07-02 NOTE — Assessment & Plan Note (Signed)
She has profound fluid retention while on higher dose of prednisone For now, she will continue furosemide and spironolactone We have extensive discussion about dietary modification

## 2018-07-02 NOTE — Assessment & Plan Note (Signed)
She has recent acute flare of her bullous skin rash With higher dose of prednisone, her skin rash and the blisters improve She has numerous pictures to show her dermatologist I recommend close follow-up including skin biopsy to exclude other condition My plan would be eventually start her on immunosuppressant with CellCept and slow prednisone taper.  She agree with the plan of care

## 2018-07-03 ENCOUNTER — Telehealth: Payer: Self-pay | Admitting: Hematology and Oncology

## 2018-07-03 DIAGNOSIS — L509 Urticaria, unspecified: Secondary | ICD-10-CM | POA: Diagnosis not present

## 2018-07-03 NOTE — Telephone Encounter (Signed)
Could not reach the patient will mail

## 2018-07-04 MED FILL — PROMACTA 50 MG TABLET: 50 | 30 days supply | Qty: 30 | Fill #0

## 2018-07-16 ENCOUNTER — Other Ambulatory Visit: Payer: Self-pay

## 2018-07-16 ENCOUNTER — Encounter: Payer: Self-pay | Admitting: Hematology and Oncology

## 2018-07-16 ENCOUNTER — Inpatient Hospital Stay (HOSPITAL_BASED_OUTPATIENT_CLINIC_OR_DEPARTMENT_OTHER): Payer: Medicare Other | Admitting: Hematology and Oncology

## 2018-07-16 ENCOUNTER — Inpatient Hospital Stay: Payer: Medicare Other

## 2018-07-16 VITALS — BP 145/75 | HR 104 | Temp 98.2°F | Resp 18 | Ht 62.0 in | Wt 168.8 lb

## 2018-07-16 DIAGNOSIS — K760 Fatty (change of) liver, not elsewhere classified: Secondary | ICD-10-CM

## 2018-07-16 DIAGNOSIS — D8989 Other specified disorders involving the immune mechanism, not elsewhere classified: Secondary | ICD-10-CM | POA: Insufficient documentation

## 2018-07-16 DIAGNOSIS — D539 Nutritional anemia, unspecified: Secondary | ICD-10-CM | POA: Diagnosis not present

## 2018-07-16 DIAGNOSIS — D693 Immune thrombocytopenic purpura: Secondary | ICD-10-CM

## 2018-07-16 DIAGNOSIS — C9500 Acute leukemia of unspecified cell type not having achieved remission: Secondary | ICD-10-CM | POA: Diagnosis not present

## 2018-07-16 DIAGNOSIS — R600 Localized edema: Secondary | ICD-10-CM

## 2018-07-16 DIAGNOSIS — I10 Essential (primary) hypertension: Secondary | ICD-10-CM | POA: Diagnosis not present

## 2018-07-16 DIAGNOSIS — D61818 Other pancytopenia: Secondary | ICD-10-CM

## 2018-07-16 DIAGNOSIS — M1A00X0 Idiopathic chronic gout, unspecified site, without tophus (tophi): Secondary | ICD-10-CM

## 2018-07-16 DIAGNOSIS — L509 Urticaria, unspecified: Secondary | ICD-10-CM | POA: Diagnosis not present

## 2018-07-16 LAB — CBC WITH DIFFERENTIAL/PLATELET
Abs Immature Granulocytes: 0.01 10*3/uL (ref 0.00–0.07)
Basophils Absolute: 0 10*3/uL (ref 0.0–0.1)
Basophils Relative: 0 %
Eosinophils Absolute: 0 10*3/uL (ref 0.0–0.5)
Eosinophils Relative: 0 %
HCT: 26.2 % — ABNORMAL LOW (ref 36.0–46.0)
Hemoglobin: 9.1 g/dL — ABNORMAL LOW (ref 12.0–15.0)
Immature Granulocytes: 1 %
Lymphocytes Relative: 50 %
Lymphs Abs: 0.7 10*3/uL (ref 0.7–4.0)
MCH: 36.5 pg — ABNORMAL HIGH (ref 26.0–34.0)
MCHC: 34.7 g/dL (ref 30.0–36.0)
MCV: 105.2 fL — ABNORMAL HIGH (ref 80.0–100.0)
Monocytes Absolute: 0.1 10*3/uL (ref 0.1–1.0)
Monocytes Relative: 3 %
Neutro Abs: 0.7 10*3/uL — ABNORMAL LOW (ref 1.7–7.7)
Neutrophils Relative %: 46 %
Platelets: 50 10*3/uL — ABNORMAL LOW (ref 150–400)
RBC: 2.49 MIL/uL — ABNORMAL LOW (ref 3.87–5.11)
RDW: 20.5 % — ABNORMAL HIGH (ref 11.5–15.5)
WBC: 1.5 10*3/uL — ABNORMAL LOW (ref 4.0–10.5)
nRBC: 1.3 % — ABNORMAL HIGH (ref 0.0–0.2)

## 2018-07-16 LAB — COMPREHENSIVE METABOLIC PANEL
ALT: 17 U/L (ref 0–44)
AST: 16 U/L (ref 15–41)
Albumin: 4.4 g/dL (ref 3.5–5.0)
Alkaline Phosphatase: 28 U/L — ABNORMAL LOW (ref 38–126)
Anion gap: 13 (ref 5–15)
BUN: 27 mg/dL — ABNORMAL HIGH (ref 8–23)
CO2: 24 mmol/L (ref 22–32)
Calcium: 9.1 mg/dL (ref 8.9–10.3)
Chloride: 101 mmol/L (ref 98–111)
Creatinine, Ser: 1.09 mg/dL — ABNORMAL HIGH (ref 0.44–1.00)
GFR calc Af Amer: 55 mL/min — ABNORMAL LOW (ref >60)
GFR calc non Af Amer: 47 mL/min — ABNORMAL LOW (ref >60)
Glucose, Bld: 128 mg/dL — ABNORMAL HIGH (ref 70–99)
Potassium: 4.1 mmol/L (ref 3.5–5.1)
Sodium: 138 mmol/L (ref 135–145)
Total Bilirubin: 2.1 mg/dL — ABNORMAL HIGH (ref 0.3–1.2)
Total Protein: 7 g/dL (ref 6.5–8.1)

## 2018-07-16 MED ORDER — MYCOPHENOLATE MOFETIL 250 MG PO CAPS: 250.0000 mg | ORAL_CAPSULE | Freq: Two times a day (BID) | ORAL | 1 refills | Status: DC

## 2018-07-16 NOTE — Assessment & Plan Note (Signed)
The patient have constellation of autoimmune pancytopenia and skin rashes that comes and goes I am not able to taper prednisone less than 20 mg We discussed the role of oral immunosuppressant with steroid sparing agent such as mycophenolate She agreed to proceed I will start her on lowest dose possible at 250 mg twice a day We discussed the risk of infection while on treatment and severe pancytopenia I will see her on a weekly basis with plan for slow prednisone taper and overlap with mycophenolate

## 2018-07-16 NOTE — Assessment & Plan Note (Signed)
She has fleeting hives that comes and goes She have proved on her cell phone of pictures that was just taken this morning with significant hives but by the time she show up to my office right now at 1:00, her hives have disappeared She was seen by dermatologist Suspect the hives are related to her autoimmune condition As above, we will continue on 20 mg of prednisone along with CellCept I have recommended the patient to continue to take pictures and will review them again next week She is advised to avoid sun exposure

## 2018-07-16 NOTE — Assessment & Plan Note (Signed)
She had recent exacerbation of pancytopenia and hemolysis She will continue Promacta for low platelet count We discussed importance of neutropenic precaution She is not symptomatic from anemia Observe carefully for now

## 2018-07-16 NOTE — Progress Notes (Signed)
[place] OFFICE PROGRESS NOTE  [name], MD  ASSESSMENT & PLAN:  Autoimmune disorder [facility]) The patient have constellation of autoimmune pancytopenia and skin rashes that comes and goes I am not able to taper prednisone less than 20 mg We discussed the role of oral immunosuppressant with steroid sparing agent such as mycophenolate She agreed to proceed I will start her on lowest dose possible at 250 mg twice a day We discussed the risk of infection while on treatment and severe pancytopenia I will see her on a weekly basis with plan for slow prednisone taper and overlap with mycophenolate  Pancytopenia, acquired ([place]) She had recent exacerbation of pancytopenia and hemolysis She will continue Promacta for low platelet count We discussed importance of neutropenic precaution She is not symptomatic from anemia Observe carefully for now  Hives She has fleeting hives that comes and goes She have proved on her cell phone of pictures that was just taken this morning with significant hives but by the time she show up to my office right now at 1:00, her hives have disappeared She was seen by dermatologist Suspect the hives are related to her autoimmune condition As above, we will continue on 20 mg of prednisone along with CellCept I have recommended the patient to continue to take pictures and will review them again next week She is advised to avoid sun exposure  Essential hypertension Her blood pressure control is satisfactory She will continue careful blood pressure monitoring and will continue on diuretic therapy  Bilateral leg edema She has persistent bilateral lower extremity edema She has successfully lost 7 pounds of fluid weight I congratulated her efforts Hopefully, with future dose taper of prednisone, her extra fluid retention will resolve   No orders of the defined types were placed in this encounter.   INTERVAL HISTORY: Claudia Moore 85 y.o. female  returns for further follow-up She showed me various pictures with her hives that comes and goes She had 2 areas of hives on around her neck and her abdomen which look blistery and swollen this morning but already resolved by the time she checks in to see me at 1:00 She denies fever or chills She managed to lose 7 pounds through careful dietary changes Her blood pressure monitoring at home has been satisfactory She added a very low-dose pseudoephedrine hydrochloride 30 mg tablets that she takes for sinus congestion and postnasal drip which seems to improve her symptoms The patient denies any recent signs or symptoms of bleeding such as spontaneous epistaxis, hematuria or hematochezia. She have seen her dermatologist recently who recommended Zantac along with Benadryl as needed Her energy level is fair  SUMMARY OF HEMATOLOGIC HISTORY:  She was last seen in 2017, after referral for abnormal CBC Her total white blood cell count usually range slightly higher than upper limits of normal, between 11.5-12.9  With associated mild thrombocytopenia. Differential confirm mild neutrophilia She was recommend observation only She was subsequently referred back to me in August 2019 due to progressive thrombocytopenia. CT scan showed no evidence of lymphoma.  She has responded well to low-dose prednisone therapy that was prescribed to treat her hives On 10/03/2017, she received 1 dose of IVIG with no response to therapy.  She also developed serum sickness after that. On October 25, 2017, she started on Promacta 25 mg daily along with prednisone taper On November 12, 2017, prednisone dose is reduced to 10 mg alternate with 5 mg every other day along with Promacta 25  mg daily On November 26, 2017, prednisone dose is reduced to 5 mg alternate with 0 mg every other day along with Promacta 25 mg daily and prednisone was subsequently discontinued On January 02, 2018, the dose of Promacta is increased to 50 mg daily On  04/05/2018, she had stopped prednisone therapy and maintained on Promacta 50 mg daily On 06/04/2018, she resumed taking prednisone 30 mg daily On 06/11/2018, prednisone is reduced to 20 mg daily along with Promacta On July 16, 2018, I added mycophenolate 250 mg twice daily along with 20 mg of prednisone along with 50 mg of Promacta  I have reviewed the past medical history, past surgical history, social history and family history with the patient and they are unchanged from previous note.  ALLERGIES:  is allergic to pine; celebrex [celecoxib]; and zyrtec [cetirizine].  MEDICATIONS:  Current Outpatient Medications  Medication Sig Dispense Refill  . Cholecalciferol (VITAMIN D) 50 MCG (2000 UT) tablet Take 2,000 Units by mouth daily.    Marland Kitchen eltrombopag (PROMACTA) 50 MG tablet Take 1 tablet (50 mg total) by mouth daily. 30 tablet 11  . escitalopram (LEXAPRO) 10 MG tablet Take 10 mg by mouth daily.  3  . furosemide (LASIX) 40 MG tablet TAKE 1 TABLET BY MOUTH EVERY DAY 30 tablet 1  . levothyroxine (SYNTHROID, LEVOTHROID) 100 MCG tablet Take 100 mcg by mouth daily before breakfast.    . Multiple Vitamins-Minerals (MULTIVITAMIN GUMMIES ADULT) CHEW Chew 1 tablet by mouth at bedtime.    . mycophenolate (CELLCEPT) 250 MG capsule Take 1 capsule (250 mg total) by mouth 2 (two) times daily. 60 capsule 1  . predniSONE (DELTASONE) 10 MG tablet Take 2 tablets (20 mg total) by mouth daily with breakfast. 90 tablet 1  . spironolactone (ALDACTONE) 25 MG tablet TAKE 1 TABLET BY MOUTH EVERY DAY 30 tablet 1   No current facility-administered medications for this visit.      REVIEW OF SYSTEMS:   Constitutional: Denies fevers, chills or night sweats Eyes: Denies blurriness of vision Ears, nose, mouth, throat, and face: Denies mucositis or sore throat Respiratory: Denies cough, dyspnea or wheezes Cardiovascular: Denies palpitation, chest discomfort  Gastrointestinal:  Denies nausea, heartburn or change in bowel  habits Lymphatics: Denies new lymphadenopathy or easy bruising Neurological:Denies numbness, tingling or new weaknesses Behavioral/Psych: Mood is stable, no new changes  All other systems were reviewed with the patient and are negative.  PHYSICAL EXAMINATION: ECOG PERFORMANCE STATUS: 2 - Symptomatic, <50% confined to bed  Vitals:   07/16/18 1257  BP: (!) 145/75  Pulse: (!) 104  Resp: 18  Temp: 98.2 F (36.8 C)  SpO2: 98%   Filed Weights   07/16/18 1257  Weight: 168 lb 12.8 oz (76.6 kg)    GENERAL:alert, no distress and comfortable SKIN: She has some mild persistent hives around her neck in sun exposed area HEART: She has persistent moderate lower extremity edema NEURO: alert & oriented x 3 with fluent speech, no focal motor/sensory deficits  LABORATORY DATA:  I have reviewed the data as listed     Component Value Date/Time   NA 138 07/16/2018 1154   K 4.1 07/16/2018 1154   CL 101 07/16/2018 1154   CO2 24 07/16/2018 1154   GLUCOSE 128 (H) 07/16/2018 1154   BUN 27 (H) 07/16/2018 1154   CREATININE 1.09 (H) 07/16/2018 1154   CREATININE 0.90 06/04/2018 1022   CALCIUM 9.1 07/16/2018 1154   PROT 7.0 07/16/2018 1154   ALBUMIN 4.4 07/16/2018 1154  AST 16 07/16/2018 1154   AST 28 06/04/2018 1022   ALT 17 07/16/2018 1154   ALT 40 06/04/2018 1022   ALKPHOS 28 (L) 07/16/2018 1154   BILITOT 2.1 (H) 07/16/2018 1154   BILITOT 1.3 (H) 06/04/2018 1022   GFRNONAA 47 (L) 07/16/2018 1154   GFRNONAA 60 (L) 06/04/2018 1022   GFRAA 55 (L) 07/16/2018 1154   GFRAA >60 06/04/2018 1022    No results found for: SPEP, UPEP  Lab Results  Component Value Date   WBC 1.5 (L) 07/16/2018   NEUTROABS 0.7 (L) 07/16/2018   HGB 9.1 (L) 07/16/2018   HCT 26.2 (L) 07/16/2018   MCV 105.2 (H) 07/16/2018   PLT 50 (L) 07/16/2018      Chemistry      Component Value Date/Time   NA 138 07/16/2018 1154   K 4.1 07/16/2018 1154   CL 101 07/16/2018 1154   CO2 24 07/16/2018 1154   BUN 27 (H)  07/16/2018 1154   CREATININE 1.09 (H) 07/16/2018 1154   CREATININE 0.90 06/04/2018 1022      Component Value Date/Time   CALCIUM 9.1 07/16/2018 1154   ALKPHOS 28 (L) 07/16/2018 1154   AST 16 07/16/2018 1154   AST 28 06/04/2018 1022   ALT 17 07/16/2018 1154   ALT 40 06/04/2018 1022   BILITOT 2.1 (H) 07/16/2018 1154   BILITOT 1.3 (H) 06/04/2018 1022     I have reviewed multiple pictures that she took on her cell phone I spent 25 minutes counseling the patient face to face. The total time spent in the appointment was 30 minutes and more than 50% was on counseling.   All questions were answered. The patient knows to call the clinic with any problems, questions or concerns. No barriers to learning was detected.    Heath Lark, MD 6/16/20201:22 PM

## 2018-07-16 NOTE — Assessment & Plan Note (Signed)
Her blood pressure control is satisfactory She will continue careful blood pressure monitoring and will continue on diuretic therapy

## 2018-07-16 NOTE — Assessment & Plan Note (Signed)
She has persistent bilateral lower extremity edema She has successfully lost 7 pounds of fluid weight I congratulated her efforts Hopefully, with future dose taper of prednisone, her extra fluid retention will resolve

## 2018-07-17 ENCOUNTER — Other Ambulatory Visit: Payer: Self-pay | Admitting: Hematology and Oncology

## 2018-07-17 ENCOUNTER — Telehealth: Payer: Self-pay | Admitting: Hematology and Oncology

## 2018-07-17 NOTE — Telephone Encounter (Signed)
Spoke with patient re 6/23 lab/fu

## 2018-07-23 ENCOUNTER — Other Ambulatory Visit: Payer: Self-pay

## 2018-07-23 ENCOUNTER — Encounter: Payer: Self-pay | Admitting: Hematology and Oncology

## 2018-07-23 ENCOUNTER — Inpatient Hospital Stay (HOSPITAL_BASED_OUTPATIENT_CLINIC_OR_DEPARTMENT_OTHER): Payer: Medicare Other | Admitting: Hematology and Oncology

## 2018-07-23 ENCOUNTER — Inpatient Hospital Stay: Payer: Medicare Other

## 2018-07-23 VITALS — BP 138/51 | HR 92 | Resp 18 | Ht 62.0 in | Wt 167.0 lb

## 2018-07-23 DIAGNOSIS — D8989 Other specified disorders involving the immune mechanism, not elsewhere classified: Secondary | ICD-10-CM | POA: Diagnosis not present

## 2018-07-23 DIAGNOSIS — D61818 Other pancytopenia: Secondary | ICD-10-CM

## 2018-07-23 DIAGNOSIS — D539 Nutritional anemia, unspecified: Secondary | ICD-10-CM | POA: Diagnosis not present

## 2018-07-23 DIAGNOSIS — L509 Urticaria, unspecified: Secondary | ICD-10-CM | POA: Diagnosis not present

## 2018-07-23 DIAGNOSIS — K760 Fatty (change of) liver, not elsewhere classified: Secondary | ICD-10-CM

## 2018-07-23 DIAGNOSIS — R600 Localized edema: Secondary | ICD-10-CM

## 2018-07-23 DIAGNOSIS — C9500 Acute leukemia of unspecified cell type not having achieved remission: Secondary | ICD-10-CM | POA: Diagnosis not present

## 2018-07-23 DIAGNOSIS — D693 Immune thrombocytopenic purpura: Secondary | ICD-10-CM

## 2018-07-23 DIAGNOSIS — I10 Essential (primary) hypertension: Secondary | ICD-10-CM | POA: Diagnosis not present

## 2018-07-23 DIAGNOSIS — M1A00X0 Idiopathic chronic gout, unspecified site, without tophus (tophi): Secondary | ICD-10-CM

## 2018-07-23 LAB — CBC WITH DIFFERENTIAL/PLATELET
Abs Immature Granulocytes: 0 10*3/uL (ref 0.00–0.07)
Basophils Absolute: 0 10*3/uL (ref 0.0–0.1)
Basophils Relative: 0 %
Eosinophils Absolute: 0 10*3/uL (ref 0.0–0.5)
Eosinophils Relative: 1 %
HCT: 21.5 % — ABNORMAL LOW (ref 36.0–46.0)
Hemoglobin: 7.6 g/dL — ABNORMAL LOW (ref 12.0–15.0)
Immature Granulocytes: 0 %
Lymphocytes Relative: 59 %
Lymphs Abs: 0.8 10*3/uL (ref 0.7–4.0)
MCH: 36.5 pg — ABNORMAL HIGH (ref 26.0–34.0)
MCHC: 35.3 g/dL (ref 30.0–36.0)
MCV: 103.4 fL — ABNORMAL HIGH (ref 80.0–100.0)
Monocytes Absolute: 0.1 10*3/uL (ref 0.1–1.0)
Monocytes Relative: 7 %
Neutro Abs: 0.4 10*3/uL — CL (ref 1.7–7.7)
Neutrophils Relative %: 33 %
Platelets: 50 10*3/uL — ABNORMAL LOW (ref 150–400)
RBC: 2.08 MIL/uL — ABNORMAL LOW (ref 3.87–5.11)
RDW: 21.2 % — ABNORMAL HIGH (ref 11.5–15.5)
Smear Review: 2
WBC: 1.4 10*3/uL — ABNORMAL LOW (ref 4.0–10.5)
nRBC: 0 % (ref 0.0–0.2)

## 2018-07-23 LAB — COMPREHENSIVE METABOLIC PANEL
ALT: 19 U/L (ref 0–44)
AST: 17 U/L (ref 15–41)
Albumin: 4.4 g/dL (ref 3.5–5.0)
Alkaline Phosphatase: 30 U/L — ABNORMAL LOW (ref 38–126)
Anion gap: 11 (ref 5–15)
BUN: 19 mg/dL (ref 8–23)
CO2: 27 mmol/L (ref 22–32)
Calcium: 9 mg/dL (ref 8.9–10.3)
Chloride: 96 mmol/L — ABNORMAL LOW (ref 98–111)
Creatinine, Ser: 1.08 mg/dL — ABNORMAL HIGH (ref 0.44–1.00)
GFR calc Af Amer: 55 mL/min — ABNORMAL LOW (ref >60)
GFR calc non Af Amer: 48 mL/min — ABNORMAL LOW (ref >60)
Glucose, Bld: 137 mg/dL — ABNORMAL HIGH (ref 70–99)
Potassium: 3.7 mmol/L (ref 3.5–5.1)
Sodium: 134 mmol/L — ABNORMAL LOW (ref 135–145)
Total Bilirubin: 2 mg/dL — ABNORMAL HIGH (ref 0.3–1.2)
Total Protein: 6.9 g/dL (ref 6.5–8.1)

## 2018-07-23 NOTE — Assessment & Plan Note (Signed)
She has persistent bilateral lower extremity edema She will continue her current diuretic therapy

## 2018-07-23 NOTE — Assessment & Plan Note (Signed)
Unfortunately, she is developing worsening pancytopenia I am concerned about myelodysplastic syndrome We discussed the risk and benefits of pursuing bone marrow aspirate and biopsy and she agreed I will schedule bone marrow biopsy tomorrow We will recheck her CBC along with other additional work-up for nutritional deficiency If her hemoglobin remains low tomorrow, we will proceed with blood transfusion

## 2018-07-23 NOTE — Progress Notes (Signed)
[place] OFFICE PROGRESS NOTE  Patient Care Team: [name], MD as PCP - General (Family Medicine)  ASSESSMENT & PLAN:  Pancytopenia, acquired [facility]) Unfortunately, she is developing worsening pancytopenia I am concerned about myelodysplastic syndrome We discussed the risk and benefits of pursuing bone marrow aspirate and biopsy and she agreed I will schedule bone marrow biopsy tomorrow We will recheck her CBC along with other additional work-up for nutritional deficiency If her hemoglobin remains low tomorrow, we will proceed with blood transfusion  Bilateral leg edema She has persistent bilateral lower extremity edema She will continue her current diuretic therapy   Orders Placed This Encounter  Procedures  . Sedimentation rate    Standing Status:   Future    Standing Expiration Date:   08/27/2019  . Vitamin B12    Standing Status:   Future    Standing Expiration Date:   08/27/2019  . Ferritin    Standing Status:   Future    Standing Expiration Date:   08/27/2019  . Folate RBC    Standing Status:   Future    Standing Expiration Date:   08/27/2019  . Iron and TIBC    Standing Status:   Future    Standing Expiration Date:   08/27/2019  . Erythropoietin    Standing Status:   Future    Standing Expiration Date:   08/27/2019  . Reticulocytes ([place])    Standing Status:   Future    Standing Expiration Date:   08/27/2019  . Copper, serum    Standing Status:   Future    Standing Expiration Date:   07/23/2019  . Direct antiglobulin test (not at [facility])    Standing Status:   Future    Standing Expiration Date:   08/27/2019  . Sample to Blood Bank    Standing Status:   Standing    Number of Occurrences:   33    Standing Expiration Date:   07/23/2019    INTERVAL HISTORY: Please see below for problem oriented charting. She returns for further follow-up Unfortunately, she did not bring pictures of her skin rash Her skin rash is better but she is experiencing  fatigue, persistent leg swelling, occasional shortness of breath on exertion, and she is attempting to lose weight She complained of some nasal drainage No recent fever or chills No cough She denies dizziness No recent bleeding.  SUMMARY OF ONCOLOGIC HISTORY:  She was last seen in 2017, after referral for abnormal CBC Her total white blood cell count usually range slightly higher than upper limits of normal, between 11.5-12.9  With associated mild thrombocytopenia. Differential confirm mild neutrophilia She was recommend observation only She was subsequently referred back to me in August 2019 due to progressive thrombocytopenia. CT scan showed no evidence of lymphoma.  She has responded well to low-dose prednisone therapy that was prescribed to treat her hives On 10/03/2017, she received 1 dose of IVIG with no response to therapy.  She also developed serum sickness after that. On October 25, 2017, she started on Promacta 25 mg daily along with prednisone taper On November 12, 2017, prednisone dose is reduced to 10 mg alternate with 5 mg every other day along with Promacta 25 mg daily On November 26, 2017, prednisone dose is reduced to 5 mg alternate with 0 mg every other day along with Promacta 25 mg daily and prednisone was subsequently discontinued On January 02, 2018, the dose of Promacta is increased to 50 mg daily On  04/05/2018, she had stopped prednisone therapy and maintained on Promacta 50 mg daily On 06/04/2018, she resumed taking prednisone 30 mg daily On 06/11/2018, prednisone is reduced to 20 mg daily along with Promacta On July 16, 2018, I recommended mycophenolate 250 mg twice daily along with 20 mg of prednisone along with 50 mg of Promacta.  Unfortunately, her insurance has denied prescription mycophenolate   REVIEW OF SYSTEMS:   Constitutional: Denies fevers, chills or abnormal weight loss Eyes: Denies blurriness of vision Ears, nose, mouth, throat, and face: Denies mucositis or  sore throat Respiratory: Denies cough, dyspnea or wheezes Cardiovascular: Denies palpitation, chest discomfort or lower extremity swelling Gastrointestinal:  Denies nausea, heartburn or change in bowel habits Lymphatics: Denies new lymphadenopathy or easy bruising Neurological:Denies numbness, tingling or new weaknesses Behavioral/Psych: Mood is stable, no new changes  All other systems were reviewed with the patient and are negative.  I have reviewed the past medical history, past surgical history, social history and family history with the patient and they are unchanged from previous note.  ALLERGIES:  is allergic to pine; celebrex [celecoxib]; and zyrtec [cetirizine].  MEDICATIONS:  Current Outpatient Medications  Medication Sig Dispense Refill  . Cholecalciferol (VITAMIN D) 50 MCG (2000 UT) tablet Take 2,000 Units by mouth daily.    Marland Kitchen eltrombopag (PROMACTA) 50 MG tablet Take 1 tablet (50 mg total) by mouth daily. 30 tablet 11  . escitalopram (LEXAPRO) 10 MG tablet Take 10 mg by mouth daily.  3  . furosemide (LASIX) 40 MG tablet TAKE 1 TABLET BY MOUTH EVERY DAY 30 tablet 1  . levothyroxine (SYNTHROID, LEVOTHROID) 100 MCG tablet Take 100 mcg by mouth daily before breakfast.    . Multiple Vitamins-Minerals (MULTIVITAMIN GUMMIES ADULT) CHEW Chew 1 tablet by mouth at bedtime.    . mycophenolate (CELLCEPT) 250 MG capsule Take 1 capsule (250 mg total) by mouth 2 (two) times daily. 60 capsule 1  . predniSONE (DELTASONE) 10 MG tablet Take 2 tablets (20 mg total) by mouth daily with breakfast. 90 tablet 1  . spironolactone (ALDACTONE) 25 MG tablet TAKE 1 TABLET BY MOUTH EVERY DAY 30 tablet 1   No current facility-administered medications for this visit.     PHYSICAL EXAMINATION: ECOG PERFORMANCE STATUS: 1 - Symptomatic but completely ambulatory  Vitals:   07/23/18 1110  BP: (!) 138/51  Pulse: 92  Resp: 18  SpO2: 98%   Filed Weights   07/23/18 1110  Weight: 167 lb (75.8 kg)     GENERAL:alert, no distress and comfortable SKIN: She has some skin hives HEART: Noted moderate bilateral lower extremity edema Musculoskeletal:no cyanosis of digits and no clubbing  NEURO: alert & oriented x 3 with fluent speech, no focal motor/sensory deficits  LABORATORY DATA:  I have reviewed the data as listed    Component Value Date/Time   NA 134 (L) 07/23/2018 1059   K 3.7 07/23/2018 1059   CL 96 (L) 07/23/2018 1059   CO2 27 07/23/2018 1059   GLUCOSE 137 (H) 07/23/2018 1059   BUN 19 07/23/2018 1059   CREATININE 1.08 (H) 07/23/2018 1059   CREATININE 0.90 06/04/2018 1022   CALCIUM 9.0 07/23/2018 1059   PROT 6.9 07/23/2018 1059   ALBUMIN 4.4 07/23/2018 1059   AST 17 07/23/2018 1059   AST 28 06/04/2018 1022   ALT 19 07/23/2018 1059   ALT 40 06/04/2018 1022   ALKPHOS 30 (L) 07/23/2018 1059   BILITOT 2.0 (H) 07/23/2018 1059   BILITOT 1.3 (H) 06/04/2018 1022  GFRNONAA 48 (L) 07/23/2018 1059   GFRNONAA 60 (L) 06/04/2018 1022   GFRAA 55 (L) 07/23/2018 1059   GFRAA >60 06/04/2018 1022    No results found for: SPEP, UPEP  Lab Results  Component Value Date   WBC 1.4 (L) 07/23/2018   NEUTROABS 0.4 (LL) 07/23/2018   HGB 7.6 (L) 07/23/2018   HCT 21.5 (L) 07/23/2018   MCV 103.4 (H) 07/23/2018   PLT 50 (L) 07/23/2018      Chemistry      Component Value Date/Time   NA 134 (L) 07/23/2018 1059   K 3.7 07/23/2018 1059   CL 96 (L) 07/23/2018 1059   CO2 27 07/23/2018 1059   BUN 19 07/23/2018 1059   CREATININE 1.08 (H) 07/23/2018 1059   CREATININE 0.90 06/04/2018 1022      Component Value Date/Time   CALCIUM 9.0 07/23/2018 1059   ALKPHOS 30 (L) 07/23/2018 1059   AST 17 07/23/2018 1059   AST 28 06/04/2018 1022   ALT 19 07/23/2018 1059   ALT 40 06/04/2018 1022   BILITOT 2.0 (H) 07/23/2018 1059   BILITOT 1.3 (H) 06/04/2018 1022     All questions were answered. The patient knows to call the clinic with any problems, questions or concerns. No barriers to learning  was detected.  I spent 25 minutes counseling the patient face to face. The total time spent in the appointment was 30 minutes and more than 50% was on counseling and review of test results  Heath Lark, MD 07/23/2018 1:11 PM

## 2018-07-24 ENCOUNTER — Other Ambulatory Visit: Payer: Self-pay

## 2018-07-24 ENCOUNTER — Inpatient Hospital Stay: Payer: Medicare Other

## 2018-07-24 ENCOUNTER — Inpatient Hospital Stay (HOSPITAL_BASED_OUTPATIENT_CLINIC_OR_DEPARTMENT_OTHER): Payer: Medicare Other | Admitting: Hematology and Oncology

## 2018-07-24 ENCOUNTER — Other Ambulatory Visit: Payer: Self-pay | Admitting: Hematology and Oncology

## 2018-07-24 ENCOUNTER — Encounter: Payer: Self-pay | Admitting: Hematology and Oncology

## 2018-07-24 VITALS — BP 108/53 | HR 59 | Temp 97.9°F | Resp 16

## 2018-07-24 DIAGNOSIS — D539 Nutritional anemia, unspecified: Secondary | ICD-10-CM

## 2018-07-24 DIAGNOSIS — I10 Essential (primary) hypertension: Secondary | ICD-10-CM | POA: Diagnosis not present

## 2018-07-24 DIAGNOSIS — D759 Disease of blood and blood-forming organs, unspecified: Secondary | ICD-10-CM | POA: Diagnosis not present

## 2018-07-24 DIAGNOSIS — D7589 Other specified diseases of blood and blood-forming organs: Secondary | ICD-10-CM | POA: Diagnosis not present

## 2018-07-24 DIAGNOSIS — M1A00X0 Idiopathic chronic gout, unspecified site, without tophus (tophi): Secondary | ICD-10-CM

## 2018-07-24 DIAGNOSIS — D61818 Other pancytopenia: Secondary | ICD-10-CM | POA: Diagnosis not present

## 2018-07-24 DIAGNOSIS — R600 Localized edema: Secondary | ICD-10-CM

## 2018-07-24 DIAGNOSIS — C9500 Acute leukemia of unspecified cell type not having achieved remission: Secondary | ICD-10-CM | POA: Diagnosis not present

## 2018-07-24 DIAGNOSIS — L509 Urticaria, unspecified: Secondary | ICD-10-CM | POA: Diagnosis not present

## 2018-07-24 DIAGNOSIS — D693 Immune thrombocytopenic purpura: Secondary | ICD-10-CM

## 2018-07-24 DIAGNOSIS — D8989 Other specified disorders involving the immune mechanism, not elsewhere classified: Secondary | ICD-10-CM | POA: Diagnosis not present

## 2018-07-24 LAB — CBC WITH DIFFERENTIAL/PLATELET
Abs Immature Granulocytes: 0.01 10*3/uL (ref 0.00–0.07)
Basophils Absolute: 0 10*3/uL (ref 0.0–0.1)
Basophils Relative: 0 %
Eosinophils Absolute: 0 10*3/uL (ref 0.0–0.5)
Eosinophils Relative: 1 %
HCT: 17.6 % — ABNORMAL LOW (ref 36.0–46.0)
Hemoglobin: 6.2 g/dL — CL (ref 12.0–15.0)
Immature Granulocytes: 1 %
Lymphocytes Relative: 77 %
Lymphs Abs: 1.1 10*3/uL (ref 0.7–4.0)
MCH: 37.1 pg — ABNORMAL HIGH (ref 26.0–34.0)
MCHC: 35.2 g/dL (ref 30.0–36.0)
MCV: 105.4 fL — ABNORMAL HIGH (ref 80.0–100.0)
Monocytes Absolute: 0.1 10*3/uL (ref 0.1–1.0)
Monocytes Relative: 6 %
Neutro Abs: 0.2 10*3/uL — CL (ref 1.7–7.7)
Neutrophils Relative %: 15 %
Platelets: 38 10*3/uL — ABNORMAL LOW (ref 150–400)
RBC: 1.67 MIL/uL — ABNORMAL LOW (ref 3.87–5.11)
RDW: 21.5 % — ABNORMAL HIGH (ref 11.5–15.5)
Smear Review: 1
WBC: 1.4 10*3/uL — ABNORMAL LOW (ref 4.0–10.5)
nRBC: 0 % (ref 0.0–0.2)

## 2018-07-24 LAB — SAMPLE TO BLOOD BANK

## 2018-07-24 LAB — SEDIMENTATION RATE: Sed Rate: 30 mm/hr — ABNORMAL HIGH (ref 0–22)

## 2018-07-24 LAB — FERRITIN: Ferritin: 289 ng/mL (ref 11–307)

## 2018-07-24 LAB — ABO/RH: ABO/RH(D): A POS

## 2018-07-24 LAB — IRON AND TIBC
Iron: 394 ug/dL — ABNORMAL HIGH (ref 41–142)
Saturation Ratios: 172 % — ABNORMAL HIGH (ref 21–57)
TIBC: 229 ug/dL — ABNORMAL LOW (ref 236–444)
UIBC: UNDETERMINED ug/dL (ref 120–384)

## 2018-07-24 LAB — DIRECT ANTIGLOBULIN TEST (NOT AT ARMC)
DAT, IgG: NEGATIVE
DAT, complement: NEGATIVE

## 2018-07-24 LAB — VITAMIN B12: Vitamin B-12: 463 pg/mL (ref 180–914)

## 2018-07-24 LAB — PREPARE RBC (CROSSMATCH)

## 2018-07-24 LAB — PATHOLOGIST SMEAR REVIEW

## 2018-07-24 MED ORDER — DIPHENHYDRAMINE HCL 25 MG PO CAPS
25.0000 mg | ORAL_CAPSULE | Freq: Once | ORAL | Status: AC
  Administered 2018-07-24: 25 mg via ORAL

## 2018-07-24 MED ORDER — DIPHENHYDRAMINE HCL 25 MG PO CAPS
ORAL_CAPSULE | ORAL | Status: AC
  Filled 2018-07-24: qty 1

## 2018-07-24 MED ORDER — SODIUM CHLORIDE 0.9% IV SOLUTION
250.0000 mL | Freq: Once | INTRAVENOUS | Status: AC
  Administered 2018-07-24: 10:00:00 250 mL via INTRAVENOUS
  Filled 2018-07-24: qty 250

## 2018-07-24 MED ORDER — ACETAMINOPHEN 325 MG PO TABS
ORAL_TABLET | ORAL | Status: AC
  Filled 2018-07-24: qty 2

## 2018-07-24 MED ORDER — ACETAMINOPHEN 325 MG PO TABS
650.0000 mg | ORAL_TABLET | Freq: Once | ORAL | Status: AC
  Administered 2018-07-24: 10:00:00 650 mg via ORAL

## 2018-07-24 MED ORDER — FUROSEMIDE 20 MG PO TABS: 40.0000 mg | ORAL_TABLET | Freq: Once | ORAL | Status: DC

## 2018-07-24 MED ORDER — FUROSEMIDE 20 MG PO TABS
40.0000 mg | ORAL_TABLET | Freq: Once | ORAL | Status: AC
  Administered 2018-07-24: 13:00:00 40 mg via ORAL

## 2018-07-24 MED ORDER — LIDOCAINE HCL 2 % IJ SOLN
INTRAMUSCULAR | Status: AC
  Filled 2018-07-24: qty 20

## 2018-07-24 MED ORDER — FUROSEMIDE 20 MG PO TABS
ORAL_TABLET | ORAL | Status: AC
  Filled 2018-07-24: qty 2

## 2018-07-24 NOTE — Progress Notes (Signed)
Dressing to left posterior hip assessed. Dressing is clean dry and intact. Patient educated and verbalized understanding of post care instructions.

## 2018-07-24 NOTE — Patient Instructions (Signed)

## 2018-07-24 NOTE — Assessment & Plan Note (Signed)
Per patient permission, we will proceed with bone marrow aspirate and biopsy Bone Marrow Biopsy and Aspiration Procedure Note   Informed consent was obtained and potential risks including bleeding, infection and pain were reviewed with the patient. The patient's name, date of birth, identification, consent and allergies were verified prior to the start of procedure and time out was performed.  The left posterior iliac crest was chosen as the site of biopsy.  The skin was prepped with Betadine solution.   8 cc of 1% lidocaine was used to provide local anaesthesia.   10 cc of bone marrow aspirate was obtained followed by 1 inch biopsy.   The procedure was tolerated well and there were no complications.  The patient was stable at the end of the procedure.  Specimens sent for flow cytometry, cytogenetics and additional studies. After bone marrow biopsy was performed, repeat CBC came back profoundly abnormal with her hemoglobin dropped further from yesterday We discussed some of the risks, benefits, and alternatives of blood transfusions. The patient is symptomatic from anemia and the hemoglobin level is critically low.  Some of the side-effects to be expected including risks of transfusion reactions, chills, infection, syndrome of volume overload and risk of hospitalization from various reasons and the patient is willing to proceed and went ahead to sign consent today. Due to her age and symptomatic anemia, I recommend 2 units of irradiated blood and she agreed with the plan I will see her back in 2 weeks to review results of bone marrow biopsy I will get her case presented at the next hematology conference

## 2018-07-24 NOTE — Progress Notes (Signed)
Patient has BP of 105/60. Spoke with Dr. Alvy Bimler regarding lasix 40mg  with current BP. Dr. Alvy Bimler wants patient to continue to have the lasix 40mg .

## 2018-07-24 NOTE — Progress Notes (Signed)
[place] OFFICE PROGRESS NOTE  Patient Care Team: [name], MD as PCP - General (Family Medicine)  ASSESSMENT & PLAN:  Pancytopenia, acquired [facility]) Per patient permission, we will proceed with bone marrow aspirate and biopsy Bone Marrow Biopsy and Aspiration Procedure Note   Informed consent was obtained and potential risks including bleeding, infection and pain were reviewed with the patient. The patient's name, date of birth, identification, consent and allergies were verified prior to the start of procedure and time out was performed.  The left posterior iliac crest was chosen as the site of biopsy.  The skin was prepped with Betadine solution.   8 cc of 1% lidocaine was used to provide local anaesthesia.   10 cc of bone marrow aspirate was obtained followed by 1 inch biopsy.   The procedure was tolerated well and there were no complications.  The patient was stable at the end of the procedure.  Specimens sent for flow cytometry, cytogenetics and additional studies. After bone marrow biopsy was performed, repeat CBC came back profoundly abnormal with her hemoglobin dropped further from yesterday We discussed some of the risks, benefits, and alternatives of blood transfusions. The patient is symptomatic from anemia and the hemoglobin level is critically low.  Some of the side-effects to be expected including risks of transfusion reactions, chills, infection, syndrome of volume overload and risk of hospitalization from various reasons and the patient is willing to proceed and went ahead to sign consent today. Due to her age and symptomatic anemia, I recommend 2 units of irradiated blood and she agreed with the plan I will see her back in 2 weeks to review results of bone marrow biopsy I will get her case presented at the next hematology conference    Orders Placed This Encounter  Procedures  . Prepare RBC    Standing Status:   Standing    Number of  Occurrences:   1    Order Specific Question:   # of Units    Answer:   2 units    Order Specific Question:   Transfusion Indications    Answer:   Symptomatic Anemia    Order Specific Question:   Special Requirements    Answer:   Irradiated    Order Specific Question:   If emergent release call blood bank    Answer:   Not emergent release    INTERVAL HISTORY: Please see below for problem oriented charting. She returns for bone marrow procedure this morning She denies fever or chills She have symptomatic shortness of breath with minimal exertion She denies bleeding  SUMMARY OF ONCOLOGIC HISTORY:  She was last seen in 2017, after referral for abnormal CBC Her total white blood cell count usually range slightly higher than upper limits of normal, between 11.5-12.9  With associated mild thrombocytopenia. Differential confirm mild neutrophilia She was recommend observation only She was subsequently referred back to me in August 2019 due to progressive thrombocytopenia. CT scan showed no evidence of lymphoma.  She has responded well to low-dose prednisone therapy that was prescribed to treat her hives On 10/03/2017, she received 1 dose of IVIG with no response to therapy.  She also developed serum sickness after that. On October 25, 2017, she started on Promacta 25 mg daily along with prednisone taper On November 12, 2017, prednisone dose is reduced to 10 mg alternate with 5 mg every other day along with Promacta 25 mg daily On November 26, 2017, prednisone dose is reduced to  5 mg alternate with 0 mg every other day along with Promacta 25 mg daily and prednisone was subsequently discontinued On January 02, 2018, the dose of Promacta is increased to 50 mg daily On 04/05/2018, she had stopped prednisone therapy and maintained on Promacta 50 mg daily On 06/04/2018, she resumed taking prednisone 30 mg daily On 06/11/2018, prednisone is reduced to 20 mg daily along with Promacta On July 16, 2018, I  recommended mycophenolate 250 mg twice daily along with 20 mg of prednisone along with 50 mg of Promacta.  Unfortunately, her insurance has denied prescription mycophenolate   REVIEW OF SYSTEMS:   Constitutional: Denies fevers, chills or abnormal weight loss Eyes: Denies blurriness of vision Ears, nose, mouth, throat, and face: Denies mucositis or sore throat Respiratory: Denies cough, dyspnea or wheezes Cardiovascular: Denies palpitation, chest discomfort or lower extremity swelling Gastrointestinal:  Denies nausea, heartburn or change in bowel habits Skin: Denies abnormal skin rashes Lymphatics: Denies new lymphadenopathy or easy bruising Neurological:Denies numbness, tingling or new weaknesses Behavioral/Psych: Mood is stable, no new changes  All other systems were reviewed with the patient and are negative.  I have reviewed the past medical history, past surgical history, social history and family history with the patient and they are unchanged from previous note.  ALLERGIES:  is allergic to pine; celebrex [celecoxib]; and zyrtec [cetirizine].  MEDICATIONS:  Current Outpatient Medications  Medication Sig Dispense Refill  . Cholecalciferol (VITAMIN D) 50 MCG (2000 UT) tablet Take 2,000 Units by mouth daily.    Marland Kitchen eltrombopag (PROMACTA) 50 MG tablet Take 1 tablet (50 mg total) by mouth daily. 30 tablet 11  . escitalopram (LEXAPRO) 10 MG tablet Take 10 mg by mouth daily.  3  . furosemide (LASIX) 40 MG tablet TAKE 1 TABLET BY MOUTH EVERY DAY 30 tablet 1  . levothyroxine (SYNTHROID, LEVOTHROID) 100 MCG tablet Take 100 mcg by mouth daily before breakfast.    . Multiple Vitamins-Minerals (MULTIVITAMIN GUMMIES ADULT) CHEW Chew 1 tablet by mouth at bedtime.    . mycophenolate (CELLCEPT) 250 MG capsule Take 1 capsule (250 mg total) by mouth 2 (two) times daily. 60 capsule 1  . predniSONE (DELTASONE) 10 MG tablet Take 2 tablets (20 mg total) by mouth daily with breakfast. 90 tablet 1  .  spironolactone (ALDACTONE) 25 MG tablet TAKE 1 TABLET BY MOUTH EVERY DAY 30 tablet 1   No current facility-administered medications for this visit.    Facility-Administered Medications Ordered in Other Visits  Medication Dose Route Frequency Provider Last Rate Last Dose  . furosemide (LASIX) tablet 40 mg  40 mg Oral Once Alvy Bimler, Serinity Ware, MD        PHYSICAL EXAMINATION: ECOG PERFORMANCE STATUS: 2 - Symptomatic, <50% confined to bed  Vitals:   07/24/18 0743 07/24/18 0850  BP: (!) 154/65 (!) 110/57  Pulse: (!) 102 73  Resp: 20 18  Temp: 98.5 F (36.9 C) 98.5 F (36.9 C)  SpO2: 99% 100%   There were no vitals filed for this visit.  GENERAL:alert, no distress and comfortable NEURO: alert & oriented x 3 with fluent speech, no focal motor/sensory deficits  LABORATORY DATA:  I have reviewed the data as listed    Component Value Date/Time   NA 134 (L) 07/23/2018 1059   K 3.7 07/23/2018 1059   CL 96 (L) 07/23/2018 1059   CO2 27 07/23/2018 1059   GLUCOSE 137 (H) 07/23/2018 1059   BUN 19 07/23/2018 1059   CREATININE 1.08 (H) 07/23/2018 1059  CREATININE 0.90 06/04/2018 1022   CALCIUM 9.0 07/23/2018 1059   PROT 6.9 07/23/2018 1059   ALBUMIN 4.4 07/23/2018 1059   AST 17 07/23/2018 1059   AST 28 06/04/2018 1022   ALT 19 07/23/2018 1059   ALT 40 06/04/2018 1022   ALKPHOS 30 (L) 07/23/2018 1059   BILITOT 2.0 (H) 07/23/2018 1059   BILITOT 1.3 (H) 06/04/2018 1022   GFRNONAA 48 (L) 07/23/2018 1059   GFRNONAA 60 (L) 06/04/2018 1022   GFRAA 55 (L) 07/23/2018 1059   GFRAA >60 06/04/2018 1022    No results found for: SPEP, UPEP  Lab Results  Component Value Date   WBC 1.4 (L) 07/24/2018   NEUTROABS 0.2 (LL) 07/24/2018   HGB 6.2 (LL) 07/24/2018   HCT 17.6 (L) 07/24/2018   MCV 105.4 (H) 07/24/2018   PLT 38 (L) 07/24/2018      Chemistry      Component Value Date/Time   NA 134 (L) 07/23/2018 1059   K 3.7 07/23/2018 1059   CL 96 (L) 07/23/2018 1059   CO2 27 07/23/2018 1059    BUN 19 07/23/2018 1059   CREATININE 1.08 (H) 07/23/2018 1059   CREATININE 0.90 06/04/2018 1022      Component Value Date/Time   CALCIUM 9.0 07/23/2018 1059   ALKPHOS 30 (L) 07/23/2018 1059   AST 17 07/23/2018 1059   AST 28 06/04/2018 1022   ALT 19 07/23/2018 1059   ALT 40 06/04/2018 1022   BILITOT 2.0 (H) 07/23/2018 1059   BILITOT 1.3 (H) 06/04/2018 1022      All questions were answered. The patient knows to call the clinic with any problems, questions or concerns. No barriers to learning was detected.  I spent 15 minutes counseling the patient face to face. The total time spent in the appointment was 20 minutes and more than 50% was on counseling and review of test results  Heath Lark, MD 07/24/2018 11:04 AM

## 2018-07-24 NOTE — Patient Instructions (Signed)

## 2018-07-25 ENCOUNTER — Other Ambulatory Visit: Payer: Self-pay

## 2018-07-25 ENCOUNTER — Inpatient Hospital Stay (HOSPITAL_BASED_OUTPATIENT_CLINIC_OR_DEPARTMENT_OTHER): Payer: Medicare Other | Admitting: Hematology and Oncology

## 2018-07-25 ENCOUNTER — Telehealth: Payer: Self-pay

## 2018-07-25 ENCOUNTER — Telehealth: Payer: Self-pay | Admitting: Hematology and Oncology

## 2018-07-25 DIAGNOSIS — D61818 Other pancytopenia: Secondary | ICD-10-CM

## 2018-07-25 DIAGNOSIS — L509 Urticaria, unspecified: Secondary | ICD-10-CM | POA: Diagnosis not present

## 2018-07-25 DIAGNOSIS — C9500 Acute leukemia of unspecified cell type not having achieved remission: Secondary | ICD-10-CM | POA: Diagnosis not present

## 2018-07-25 DIAGNOSIS — Z7189 Other specified counseling: Secondary | ICD-10-CM | POA: Diagnosis not present

## 2018-07-25 DIAGNOSIS — I10 Essential (primary) hypertension: Secondary | ICD-10-CM | POA: Diagnosis not present

## 2018-07-25 DIAGNOSIS — D8989 Other specified disorders involving the immune mechanism, not elsewhere classified: Secondary | ICD-10-CM | POA: Diagnosis not present

## 2018-07-25 DIAGNOSIS — D539 Nutritional anemia, unspecified: Secondary | ICD-10-CM | POA: Diagnosis not present

## 2018-07-25 LAB — TYPE AND SCREEN
ABO/RH(D): A POS
Antibody Screen: NEGATIVE
Unit division: 0
Unit division: 0

## 2018-07-25 LAB — FOLATE RBC
Folate, Hemolysate: 355 ng/mL
Folate, RBC: 2017 ng/mL (ref >498)
Hematocrit: 17.6 % — CL (ref 34.0–46.6)

## 2018-07-25 LAB — BPAM RBC
Blood Product Expiration Date: 202007022359
Blood Product Expiration Date: 202007142359
ISSUE DATE / TIME: 202006241034
ISSUE DATE / TIME: 202006241034
Unit Type and Rh: 6200
Unit Type and Rh: 6200

## 2018-07-25 LAB — ERYTHROPOIETIN: Erythropoietin: 1070 m[IU]/mL — ABNORMAL HIGH (ref 2.6–18.5)

## 2018-07-25 NOTE — Telephone Encounter (Signed)
Scheduled appt per 6/25 sch message - unable to reach pt . Left message with apt date and time

## 2018-07-25 NOTE — Progress Notes (Unsigned)
Folate hematocrit reported to and read back by RN karen hess at 12.11pm

## 2018-07-25 NOTE — Telephone Encounter (Signed)
Called back. She will come in today for 1 pm appt.

## 2018-07-25 NOTE — Telephone Encounter (Signed)
Called and left a message asking her to come in today at 1 pm for appt with Dr. Alvy Bimler. Ask her to call the office back.

## 2018-07-26 ENCOUNTER — Encounter: Payer: Self-pay | Admitting: Hematology and Oncology

## 2018-07-26 DIAGNOSIS — C9500 Acute leukemia of unspecified cell type not having achieved remission: Secondary | ICD-10-CM | POA: Insufficient documentation

## 2018-07-26 LAB — COPPER, SERUM: Copper: 88 ug/dL (ref 72–166)

## 2018-07-26 NOTE — Assessment & Plan Note (Signed)
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

## 2018-07-26 NOTE — Assessment & Plan Note (Signed)
We had extensive goals of care discussion I spent almost an hour with the patient face-to-face along with discussion with her husband over the telephone The patient has expressed desire not to pursue chemotherapy and to be treated with dignity She has advanced directives and living will at home We discussed CODE STATUS briefly and the patient is leaning towards DO NOT RESUSCITATE We discussed prognosis without chemotherapy will likely be less than 6 months Without transfusion support, it will probably be less than 30 days We will have further discussion about goals of care next week when we meet

## 2018-07-26 NOTE — Assessment & Plan Note (Signed)
I have personally reviewed her bone marrow biopsy from yesterday with the pathologist Unfortunately, the patient indeed have clinical findings of acute leukemia The subtype is unknown at this point and full report might not be available until end of the week or next week I have a very prolonged discussion with the patient as well as her husband over the telephone We discussed the spectrum of care including transfusion support, chemotherapy or palliative care only We discussed some of the risk, benefits, side effects of each option The patient is leaning towards transfusion support/palliative care only I recommend the patient to continue further discussion with family members over the weekend I plan to see her again next week to recheck her blood and transfuse as needed Hopefully, we will have full report by next week to determine the next step I have recommend the patient to discontinue Promacta and CellCept

## 2018-07-26 NOTE — Progress Notes (Signed)
[place] OFFICE PROGRESS NOTE  Patient Care Team: [name], MD as PCP - General (Family Medicine)  ASSESSMENT & PLAN:  Acute leukemia [facility]) I have personally reviewed her bone marrow biopsy from yesterday with the pathologist Unfortunately, the patient indeed have clinical findings of acute leukemia The subtype is unknown at this point and full report might not be available until end of the week or next week I have a very prolonged discussion with the patient as well as her husband over the telephone We discussed the spectrum of care including transfusion support, chemotherapy or palliative care only We discussed some of the risk, benefits, side effects of each option The patient is leaning towards transfusion support/palliative care only I recommend the patient to continue further discussion with family members over the weekend I plan to see her again next week to recheck her blood and transfuse as needed Hopefully, we will have full report by next week to determine the next step I have recommend the patient to discontinue Promacta and CellCept  Pancytopenia, acquired ([place]) Her pancytopenia is caused by acute leukemia causing bone marrow infiltration She has received 2 units of blood on 07/24/2018 I plan to see her again next week and repeat blood count and transfuse as needed  Goals of care, counseling/discussion We had extensive goals of care discussion I spent almost an hour with the patient face-to-face along with discussion with her husband over the telephone The patient has expressed desire not to pursue chemotherapy and to be treated with dignity She has advanced directives and living will at home We discussed CODE STATUS briefly and the patient is leaning towards DO NOT RESUSCITATE We discussed prognosis without chemotherapy will likely be less than 6 months Without transfusion support, it will probably be less than 30 days We will have further discussion  about goals of care next week when we meet   No orders of the defined types were placed in this encounter.   INTERVAL HISTORY: Please see below for problem oriented charting. She is seen urgently today because of recent findings of acute leukemia I received a telephone call in the afternoon by pathologist and reviewed the bone marrow with the pathologist which confirmed the diagnosis of acute leukemia Since she has received 2 units of blood, her energy level has improved She denies chest pain, dizziness or shortness of breath The patient is very tearful upon the findings of acute leukemia  SUMMARY OF ONCOLOGIC HISTORY:  She was last seen in 2017, after referral for abnormal CBC Her total white blood cell count usually range slightly higher than upper limits of normal, between 11.5-12.9  With associated mild thrombocytopenia. Differential confirm mild neutrophilia She was recommend observation only She was subsequently referred back to me in August 2019 due to progressive thrombocytopenia. CT scan showed no evidence of lymphoma.  She has responded well to low-dose prednisone therapy that was prescribed to treat her hives On 10/03/2017, she received 1 dose of IVIG with no response to therapy.  She also developed serum sickness after that. On October 25, 2017, she started on Promacta 25 mg daily along with prednisone taper On November 12, 2017, prednisone dose is reduced to 10 mg alternate with 5 mg every other day along with Promacta 25 mg daily On November 26, 2017, prednisone dose is reduced to 5 mg alternate with 0 mg every other day along with Promacta 25 mg daily and prednisone was subsequently discontinued On January 02, 2018, the dose of  Promacta is increased to 50 mg daily On 04/05/2018, she had stopped prednisone therapy and maintained on Promacta 50 mg daily On 06/04/2018, she resumed taking prednisone 30 mg daily On 06/11/2018, prednisone is reduced to 20 mg daily along with  Promacta On July 16, 2018, I recommended mycophenolate 250 mg twice daily along with 20 mg of prednisone along with 50 mg of Promacta.  Unfortunately, her insurance has denied prescription mycophenolate In July 24, 2018, she underwent bone marrow aspirate and biopsy.  Preliminary results suggest acute leukemia  REVIEW OF SYSTEMS:   Constitutional: Denies fevers, chills or abnormal weight loss Eyes: Denies blurriness of vision Ears, nose, mouth, throat, and face: Denies mucositis or sore throat Respiratory: Denies cough, dyspnea or wheezes Cardiovascular: Denies palpitation, chest discomfort or lower extremity swelling Gastrointestinal:  Denies nausea, heartburn or change in bowel habits Skin: Denies abnormal skin rashes Lymphatics: Denies new lymphadenopathy or easy bruising Neurological:Denies numbness, tingling or new weaknesses Behavioral/Psych: Mood is stable, no new changes  All other systems were reviewed with the patient and are negative.  I have reviewed the past medical history, past surgical history, social history and family history with the patient and they are unchanged from previous note.  ALLERGIES:  is allergic to pine; celebrex [celecoxib]; and zyrtec [cetirizine].  MEDICATIONS:  Current Outpatient Medications  Medication Sig Dispense Refill  . Cholecalciferol (VITAMIN D) 50 MCG (2000 UT) tablet Take 2,000 Units by mouth daily.    Marland Kitchen escitalopram (LEXAPRO) 10 MG tablet Take 10 mg by mouth daily.  3  . furosemide (LASIX) 40 MG tablet TAKE 1 TABLET BY MOUTH EVERY DAY 30 tablet 1  . levothyroxine (SYNTHROID, LEVOTHROID) 100 MCG tablet Take 100 mcg by mouth daily before breakfast.    . Multiple Vitamins-Minerals (MULTIVITAMIN GUMMIES ADULT) CHEW Chew 1 tablet by mouth at bedtime.    . predniSONE (DELTASONE) 10 MG tablet Take 2 tablets (20 mg total) by mouth daily with breakfast. 90 tablet 1  . spironolactone (ALDACTONE) 25 MG tablet TAKE 1 TABLET BY MOUTH EVERY DAY 30 tablet  1   No current facility-administered medications for this visit.     PHYSICAL EXAMINATION: ECOG PERFORMANCE STATUS: 1 - Symptomatic but completely ambulatory  Vitals:   07/25/18 1255  BP: 139/70  Pulse: 95  Resp: 18  Temp: 98.7 F (37.1 C)  SpO2: 99%   Filed Weights   07/25/18 1255  Weight: 161 lb (73 kg)    GENERAL:alert, no distress and comfortable NEURO: alert & oriented x 3 with fluent speech, no focal motor/sensory deficits  LABORATORY DATA:  I have reviewed the data as listed    Component Value Date/Time   NA 134 (L) 07/23/2018 1059   K 3.7 07/23/2018 1059   CL 96 (L) 07/23/2018 1059   CO2 27 07/23/2018 1059   GLUCOSE 137 (H) 07/23/2018 1059   BUN 19 07/23/2018 1059   CREATININE 1.08 (H) 07/23/2018 1059   CREATININE 0.90 06/04/2018 1022   CALCIUM 9.0 07/23/2018 1059   PROT 6.9 07/23/2018 1059   ALBUMIN 4.4 07/23/2018 1059   AST 17 07/23/2018 1059   AST 28 06/04/2018 1022   ALT 19 07/23/2018 1059   ALT 40 06/04/2018 1022   ALKPHOS 30 (L) 07/23/2018 1059   BILITOT 2.0 (H) 07/23/2018 1059   BILITOT 1.3 (H) 06/04/2018 1022   GFRNONAA 48 (L) 07/23/2018 1059   GFRNONAA 60 (L) 06/04/2018 1022   GFRAA 55 (L) 07/23/2018 1059   GFRAA >60 06/04/2018 1022  No results found for: SPEP, UPEP  Lab Results  Component Value Date   WBC 1.4 (L) 07/24/2018   NEUTROABS 0.2 (LL) 07/24/2018   HGB 6.2 (LL) 07/24/2018   HCT 17.6 (LL) 07/24/2018   MCV 105.4 (H) 07/24/2018   PLT 38 (L) 07/24/2018      Chemistry      Component Value Date/Time   NA 134 (L) 07/23/2018 1059   K 3.7 07/23/2018 1059   CL 96 (L) 07/23/2018 1059   CO2 27 07/23/2018 1059   BUN 19 07/23/2018 1059   CREATININE 1.08 (H) 07/23/2018 1059   CREATININE 0.90 06/04/2018 1022      Component Value Date/Time   CALCIUM 9.0 07/23/2018 1059   ALKPHOS 30 (L) 07/23/2018 1059   AST 17 07/23/2018 1059   AST 28 06/04/2018 1022   ALT 19 07/23/2018 1059   ALT 40 06/04/2018 1022   BILITOT 2.0 (H)  07/23/2018 1059   BILITOT 1.3 (H) 06/04/2018 1022      All questions were answered. The patient knows to call the clinic with any problems, questions or concerns. No barriers to learning was detected.  I spent 60 minutes counseling the patient face to face. The total time spent in the appointment was 80 minutes and more than 50% was on counseling and review of test results  Heath Lark, MD 07/26/2018 7:08 AM

## 2018-07-28 ENCOUNTER — Other Ambulatory Visit: Payer: Self-pay | Admitting: Hematology and Oncology

## 2018-07-30 ENCOUNTER — Inpatient Hospital Stay: Payer: Medicare Other

## 2018-07-30 ENCOUNTER — Inpatient Hospital Stay (HOSPITAL_BASED_OUTPATIENT_CLINIC_OR_DEPARTMENT_OTHER): Payer: Medicare Other | Admitting: Hematology and Oncology

## 2018-07-30 ENCOUNTER — Other Ambulatory Visit: Payer: Self-pay

## 2018-07-30 VITALS — BP 155/58 | HR 92 | Temp 98.7°F | Resp 18 | Ht 62.0 in | Wt 172.0 lb

## 2018-07-30 DIAGNOSIS — C9500 Acute leukemia of unspecified cell type not having achieved remission: Secondary | ICD-10-CM | POA: Diagnosis not present

## 2018-07-30 DIAGNOSIS — R600 Localized edema: Secondary | ICD-10-CM | POA: Diagnosis not present

## 2018-07-30 DIAGNOSIS — D693 Immune thrombocytopenic purpura: Secondary | ICD-10-CM

## 2018-07-30 DIAGNOSIS — D539 Nutritional anemia, unspecified: Secondary | ICD-10-CM | POA: Diagnosis not present

## 2018-07-30 DIAGNOSIS — L509 Urticaria, unspecified: Secondary | ICD-10-CM | POA: Diagnosis not present

## 2018-07-30 DIAGNOSIS — D8989 Other specified disorders involving the immune mechanism, not elsewhere classified: Secondary | ICD-10-CM | POA: Diagnosis not present

## 2018-07-30 DIAGNOSIS — Z7189 Other specified counseling: Secondary | ICD-10-CM

## 2018-07-30 DIAGNOSIS — D61818 Other pancytopenia: Secondary | ICD-10-CM

## 2018-07-30 DIAGNOSIS — M1A00X0 Idiopathic chronic gout, unspecified site, without tophus (tophi): Secondary | ICD-10-CM

## 2018-07-30 DIAGNOSIS — I10 Essential (primary) hypertension: Secondary | ICD-10-CM | POA: Diagnosis not present

## 2018-07-30 LAB — CBC WITH DIFFERENTIAL/PLATELET
Abs Immature Granulocytes: 0 10*3/uL (ref 0.00–0.07)
Basophils Absolute: 0 10*3/uL (ref 0.0–0.1)
Basophils Relative: 0 %
Eosinophils Absolute: 0 10*3/uL (ref 0.0–0.5)
Eosinophils Relative: 0 %
HCT: 25.3 % — ABNORMAL LOW (ref 36.0–46.0)
Hemoglobin: 8.7 g/dL — ABNORMAL LOW (ref 12.0–15.0)
Lymphocytes Relative: 56 %
Lymphs Abs: 0.7 10*3/uL (ref 0.7–4.0)
MCH: 34.1 pg — ABNORMAL HIGH (ref 26.0–34.0)
MCHC: 34.4 g/dL (ref 30.0–36.0)
MCV: 99.2 fL (ref 80.0–100.0)
Monocytes Absolute: 0.1 10*3/uL (ref 0.1–1.0)
Monocytes Relative: 6 %
Neutro Abs: 0.5 10*3/uL — ABNORMAL LOW (ref 1.7–17.7)
Neutrophils Relative %: 38 %
Platelets: 37 10*3/uL — ABNORMAL LOW (ref 150–400)
RBC: 2.55 MIL/uL — ABNORMAL LOW (ref 3.87–5.11)
RDW: 21.7 % — ABNORMAL HIGH (ref 11.5–15.5)
WBC: 1.2 10*3/uL — ABNORMAL LOW (ref 4.0–10.5)
nRBC: 2.5 % — ABNORMAL HIGH (ref 0.0–0.2)

## 2018-07-30 LAB — SAMPLE TO BLOOD BANK

## 2018-07-31 ENCOUNTER — Encounter (HOSPITAL_COMMUNITY): Payer: Self-pay | Admitting: Hematology and Oncology

## 2018-07-31 ENCOUNTER — Telehealth: Payer: Self-pay | Admitting: Hematology and Oncology

## 2018-07-31 ENCOUNTER — Encounter: Payer: Self-pay | Admitting: Hematology and Oncology

## 2018-07-31 NOTE — Progress Notes (Signed)
[name] OFFICE PROGRESS NOTE  Patient Care Team: Claudia Small, MD as PCP - General (Family Medicine)  ASSESSMENT & PLAN:  Acute leukemia [name]) I had further discussion with pathologist The subtype of leukemia cannot be decided on her sample We will wait for molecular testing The patient has made informed decision not to pursue palliative chemotherapy However, she is interested to get transfusion support as tolerated I will arrange for PICC line placement in anticipation for future blood draw and transfusion need I will see her on a weekly basis for supportive care  Pancytopenia, acquired [name]) She has symptomatic improvement after blood transfusion She does not need blood transfusion today Due to anticipated need for excessive transfusion in the future, she agreed for PICC line placement She will continue low-dose prednisone therapy that seems to help to control the platelet count, improve energy and appetite She will get irradiated blood products We will transfuse a unit of blood to keep hemoglobin above 8 We will transfuse a unit of platelets if platelet count is less than 10 or if she have signs of bleeding  Bilateral leg edema She had worsening fluid retention around her legs because she missed doses of spironolactone and she increased her prednisone recently to 30 mg I recommend reducing prednisone back to 20 mg and reminded her to take furosemide and spironolactone daily  Goals of care, counseling/discussion We had further discussions about goals of care The patient desires DO NOT RESUSCITATE She does not want palliative chemotherapy but would like to continue transfusion support   Orders Placed This Encounter  Procedures  . IR Fluoro Guide CV Line Right    Standing Status:   Future    Standing Expiration Date:   10/01/2019    Order Specific Question:   Reason for Exam (SYMPTOM  OR DIAGNOSIS REQUIRED)    Answer:   need IV access, double lumen    Order  Specific Question:   Preferred Imaging Location?    Answer:   [facility]  . Comprehensive metabolic panel    Standing Status:   Standing    Number of Occurrences:   9    Standing Expiration Date:   07/31/2019    INTERVAL HISTORY: Please see below for problem oriented charting. She returns for further follow-up and discussion about goals of care She is doing well Appetite is stable No bleeding complications at home She has improved energy level since transfusion She denies chest pain or shortness of breath  SUMMARY OF ONCOLOGIC HISTORY: Oncology History  Acute leukemia ([name])  07/24/2018 Bone Marrow Biopsy   Bone Marrow, Aspirate,Biopsy, and Clot - HYPERCELLULAR BONE MARROW WITH ACUTE LEUKEMIA - SEE COMMENT PERIPHERAL BLOOD: - PANCYTOPENIA WITH CIRCULATING BLASTS Diagnosis Note The bone marrow is significantly involved by an acute leukemic process. 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 Correlation with cytogenetic and molecular studies is strongly recommended.   07/26/2018 Initial Diagnosis   Acute leukemia ([facility])     REVIEW OF SYSTEMS:   Constitutional: Denies fevers, chills or abnormal weight loss Eyes: Denies blurriness of vision Ears, nose, mouth, throat, and face: Denies mucositis or sore throat Respiratory: Denies cough, dyspnea or wheezes Cardiovascular: Denies palpitation, chest discomfort  Gastrointestinal:  Denies nausea, heartburn or change in bowel habits Skin: Denies abnormal skin rashes Lymphatics: Denies new lymphadenopathy or easy bruising Neurological:Denies numbness, tingling or new  weaknesses Behavioral/Psych: Mood is stable, no new changes  All other systems were reviewed with the patient and are negative.  I have reviewed the past medical history, past surgical history, social history and family  history with the patient and they are unchanged from previous note.  ALLERGIES:  is allergic to pine; celebrex [celecoxib]; and zyrtec [cetirizine].  MEDICATIONS:  Current Outpatient Medications  Medication Sig Dispense Refill  . Cholecalciferol (VITAMIN D) 50 MCG (2000 UT) tablet Take 2,000 Units by mouth daily.    Marland Kitchen escitalopram (LEXAPRO) 10 MG tablet Take 10 mg by mouth daily.  3  . furosemide (LASIX) 40 MG tablet TAKE 1 TABLET BY MOUTH EVERY DAY 30 tablet 1  . levothyroxine (SYNTHROID, LEVOTHROID) 100 MCG tablet Take 100 mcg by mouth daily before breakfast.    . Multiple Vitamins-Minerals (MULTIVITAMIN GUMMIES ADULT) CHEW Chew 1 tablet by mouth at bedtime.    . predniSONE (DELTASONE) 10 MG tablet Take 2 tablets (20 mg total) by mouth daily with breakfast. 60 tablet 1  . spironolactone (ALDACTONE) 25 MG tablet TAKE 1 TABLET BY MOUTH EVERY DAY 30 tablet 1   No current facility-administered medications for this visit.     PHYSICAL EXAMINATION: ECOG PERFORMANCE STATUS: 2 - Symptomatic, <50% confined to bed  Vitals:   07/30/18 1116  BP: (!) 155/58  Pulse: 92  Resp: 18  Temp: 98.7 F (37.1 C)  SpO2: 99%   Filed Weights   07/30/18 1116  Weight: 172 lb (78 kg)    GENERAL:alert, no distress and comfortable HEART: She has moderate bilateral lower extremity edema Musculoskeletal:no cyanosis of digits and no clubbing  NEURO: alert & oriented x 3 with fluent speech, no focal motor/sensory deficits  LABORATORY DATA:  I have reviewed the data as listed    Component Value Date/Time   NA 134 (L) 07/23/2018 1059   K 3.7 07/23/2018 1059   CL 96 (L) 07/23/2018 1059   CO2 27 07/23/2018 1059   GLUCOSE 137 (H) 07/23/2018 1059   BUN 19 07/23/2018 1059   CREATININE 1.08 (H) 07/23/2018 1059   CREATININE 0.90 06/04/2018 1022   CALCIUM 9.0 07/23/2018 1059   PROT 6.9 07/23/2018 1059   ALBUMIN 4.4 07/23/2018 1059   AST 17 07/23/2018 1059   AST 28 06/04/2018 1022   ALT 19 07/23/2018  1059   ALT 40 06/04/2018 1022   ALKPHOS 30 (L) 07/23/2018 1059   BILITOT 2.0 (H) 07/23/2018 1059   BILITOT 1.3 (H) 06/04/2018 1022   GFRNONAA 48 (L) 07/23/2018 1059   GFRNONAA 60 (L) 06/04/2018 1022   GFRAA 55 (L) 07/23/2018 1059   GFRAA >60 06/04/2018 1022    No results found for: SPEP, UPEP  Lab Results  Component Value Date   WBC 1.2 (L) 07/30/2018   NEUTROABS 0.5 (L) 07/30/2018   HGB 8.7 (L) 07/30/2018   HCT 25.3 (L) 07/30/2018   MCV 99.2 07/30/2018   PLT 37 (L) 07/30/2018      Chemistry      Component Value Date/Time   NA 134 (L) 07/23/2018 1059   K 3.7 07/23/2018 1059   CL 96 (L) 07/23/2018 1059   CO2 27 07/23/2018 1059   BUN 19 07/23/2018 1059   CREATININE 1.08 (H) 07/23/2018 1059   CREATININE 0.90 06/04/2018 1022      Component Value Date/Time   CALCIUM 9.0 07/23/2018 1059   ALKPHOS 30 (L) 07/23/2018 1059   AST 17 07/23/2018 1059   AST 28 06/04/2018 1022   ALT  19 07/23/2018 1059   ALT 40 06/04/2018 1022   BILITOT 2.0 (H) 07/23/2018 1059   BILITOT 1.3 (H) 06/04/2018 1022      All questions were answered. The patient knows to call the clinic with any problems, questions or concerns. No barriers to learning was detected.  I spent 25 minutes counseling the patient face to face. The total time spent in the appointment was 30 minutes and more than 50% was on counseling and review of test results  Heath Lark, MD 07/31/2018 9:04 AM

## 2018-07-31 NOTE — Telephone Encounter (Signed)
I talk with patient regarding schedule  

## 2018-07-31 NOTE — Assessment & Plan Note (Signed)
She had worsening fluid retention around her legs because she missed doses of spironolactone and she increased her prednisone recently to 30 mg I recommend reducing prednisone back to 20 mg and reminded her to take furosemide and spironolactone daily

## 2018-07-31 NOTE — Assessment & Plan Note (Signed)
She has symptomatic improvement after blood transfusion She does not need blood transfusion today Due to anticipated need for excessive transfusion in the future, she agreed for PICC line placement She will continue low-dose prednisone therapy that seems to help to control the platelet count, improve energy and appetite She will get irradiated blood products We will transfuse a unit of blood to keep hemoglobin above 8 We will transfuse a unit of platelets if platelet count is less than 10 or if she have signs of bleeding

## 2018-07-31 NOTE — Assessment & Plan Note (Signed)
We had further discussions about goals of care The patient desires DO NOT RESUSCITATE She does not want palliative chemotherapy but would like to continue transfusion support

## 2018-07-31 NOTE — Assessment & Plan Note (Signed)
I had further discussion with pathologist The subtype of leukemia cannot be decided on her sample We will wait for molecular testing The patient has made informed decision not to pursue palliative chemotherapy However, she is interested to get transfusion support as tolerated I will arrange for PICC line placement in anticipation for future blood draw and transfusion need I will see her on a weekly basis for supportive care

## 2018-08-01 ENCOUNTER — Encounter (HOSPITAL_COMMUNITY): Payer: Self-pay | Admitting: Hematology and Oncology

## 2018-08-01 ENCOUNTER — Other Ambulatory Visit: Payer: Self-pay | Admitting: Hematology and Oncology

## 2018-08-06 ENCOUNTER — Ambulatory Visit: Payer: Medicare Other | Admitting: Hematology and Oncology

## 2018-08-06 ENCOUNTER — Other Ambulatory Visit: Payer: Self-pay | Admitting: Hematology and Oncology

## 2018-08-06 ENCOUNTER — Ambulatory Visit (HOSPITAL_COMMUNITY)
Admission: RE | Admit: 2018-08-06 | Discharge: 2018-08-06 | Disposition: A | Discharge status: Home / Self Care | Payer: Medicare Other | Source: Ambulatory Visit | Attending: Hematology and Oncology | Admitting: Hematology and Oncology

## 2018-08-06 ENCOUNTER — Other Ambulatory Visit: Payer: Self-pay

## 2018-08-06 DIAGNOSIS — Z452 Encounter for adjustment and management of vascular access device: Secondary | ICD-10-CM | POA: Diagnosis not present

## 2018-08-06 DIAGNOSIS — C9500 Acute leukemia of unspecified cell type not having achieved remission: Secondary | ICD-10-CM

## 2018-08-06 MED ORDER — HEPARIN SOD (PORK) LOCK FLUSH 100 UNIT/ML IV SOLN
INTRAVENOUS | Status: DC | PRN
  Administered 2018-08-06: 500 [IU]

## 2018-08-06 MED ORDER — LIDOCAINE HCL 1 % IJ SOLN
INTRAMUSCULAR | Status: AC
  Filled 2018-08-06: qty 20

## 2018-08-06 MED ORDER — LIDOCAINE HCL 1 % IJ SOLN
INTRAMUSCULAR | Status: DC | PRN
  Administered 2018-08-06: 5 mL

## 2018-08-06 MED ORDER — HEPARIN SOD (PORK) LOCK FLUSH 100 UNIT/ML IV SOLN
INTRAVENOUS | Status: AC
  Filled 2018-08-06: qty 5

## 2018-08-06 NOTE — Procedures (Signed)
Interventional Radiology Procedure Note  Procedure: Placement of a right arm PICC, 40cm, DL. Tip is positioned at the superior cavoatrial junction and catheter is ready for immediate use.  Complications: None Recommendations:  - Ok to use - dc home - Do not submerge  - Routine line care   Signed,  Dulcy Fanny. Earleen Newport, DO

## 2018-08-07 ENCOUNTER — Other Ambulatory Visit: Payer: Self-pay | Admitting: Hematology and Oncology

## 2018-08-07 ENCOUNTER — Inpatient Hospital Stay: Payer: Medicare Other

## 2018-08-07 ENCOUNTER — Inpatient Hospital Stay (HOSPITAL_BASED_OUTPATIENT_CLINIC_OR_DEPARTMENT_OTHER): Payer: Medicare Other | Admitting: Hematology and Oncology

## 2018-08-07 ENCOUNTER — Inpatient Hospital Stay: Payer: Medicare Other | Attending: Hematology and Oncology

## 2018-08-07 ENCOUNTER — Other Ambulatory Visit: Payer: Self-pay

## 2018-08-07 ENCOUNTER — Encounter: Payer: Self-pay | Admitting: Hematology and Oncology

## 2018-08-07 DIAGNOSIS — R600 Localized edema: Secondary | ICD-10-CM

## 2018-08-07 DIAGNOSIS — D539 Nutritional anemia, unspecified: Secondary | ICD-10-CM

## 2018-08-07 DIAGNOSIS — C9500 Acute leukemia of unspecified cell type not having achieved remission: Secondary | ICD-10-CM | POA: Diagnosis not present

## 2018-08-07 DIAGNOSIS — Z79899 Other long term (current) drug therapy: Secondary | ICD-10-CM | POA: Insufficient documentation

## 2018-08-07 DIAGNOSIS — D649 Anemia, unspecified: Secondary | ICD-10-CM | POA: Diagnosis not present

## 2018-08-07 DIAGNOSIS — M1A00X0 Idiopathic chronic gout, unspecified site, without tophus (tophi): Secondary | ICD-10-CM

## 2018-08-07 DIAGNOSIS — D693 Immune thrombocytopenic purpura: Secondary | ICD-10-CM

## 2018-08-07 DIAGNOSIS — K140 Glossitis: Secondary | ICD-10-CM | POA: Diagnosis not present

## 2018-08-07 DIAGNOSIS — E790 Hyperuricemia without signs of inflammatory arthritis and tophaceous disease: Secondary | ICD-10-CM

## 2018-08-07 DIAGNOSIS — D61818 Other pancytopenia: Secondary | ICD-10-CM

## 2018-08-07 LAB — COMPREHENSIVE METABOLIC PANEL
ALT: 16 U/L (ref 0–44)
AST: 17 U/L (ref 15–41)
Albumin: 3.9 g/dL (ref 3.5–5.0)
Alkaline Phosphatase: 27 U/L — ABNORMAL LOW (ref 38–126)
Anion gap: 9 (ref 5–15)
BUN: 21 mg/dL (ref 8–23)
CO2: 27 mmol/L (ref 22–32)
Calcium: 8.6 mg/dL — ABNORMAL LOW (ref 8.9–10.3)
Chloride: 103 mmol/L (ref 98–111)
Creatinine, Ser: 0.85 mg/dL (ref 0.44–1.00)
GFR calc Af Amer: >60 mL/min (ref >60)
GFR calc non Af Amer: >60 mL/min (ref >60)
Glucose, Bld: 131 mg/dL — ABNORMAL HIGH (ref 70–99)
Potassium: 3.6 mmol/L (ref 3.5–5.1)
Sodium: 139 mmol/L (ref 135–145)
Total Bilirubin: 1.5 mg/dL — ABNORMAL HIGH (ref 0.3–1.2)
Total Protein: 6.3 g/dL — ABNORMAL LOW (ref 6.5–8.1)

## 2018-08-07 LAB — CBC WITH DIFFERENTIAL/PLATELET
Abs Immature Granulocytes: 0 10*3/uL (ref 0.00–0.07)
Basophils Absolute: 0 10*3/uL (ref 0.0–0.1)
Basophils Relative: 0 %
Blasts: 11 %
Eosinophils Absolute: 0 10*3/uL (ref 0.0–0.5)
Eosinophils Relative: 0 %
HCT: 19.3 % — ABNORMAL LOW (ref 36.0–46.0)
Hemoglobin: 6.5 g/dL — CL (ref 12.0–15.0)
Lymphocytes Relative: 58 %
Lymphs Abs: 0.6 10*3/uL — ABNORMAL LOW (ref 0.7–4.0)
MCH: 34.6 pg — ABNORMAL HIGH (ref 26.0–34.0)
MCHC: 33.7 g/dL (ref 30.0–36.0)
MCV: 102.7 fL — ABNORMAL HIGH (ref 80.0–100.0)
Monocytes Absolute: 0.1 10*3/uL (ref 0.1–1.0)
Monocytes Relative: 5 %
Neutro Abs: 0.3 10*3/uL — CL (ref 1.7–17.7)
Neutrophils Relative %: 26 %
Platelets: 36 10*3/uL — ABNORMAL LOW (ref 150–400)
RBC: 1.88 MIL/uL — ABNORMAL LOW (ref 3.87–5.11)
RDW: 23.4 % — ABNORMAL HIGH (ref 11.5–15.5)
WBC: 1 10*3/uL — ABNORMAL LOW (ref 4.0–10.5)
nRBC: 4.1 % — ABNORMAL HIGH (ref 0.0–0.2)

## 2018-08-07 LAB — PREPARE RBC (CROSSMATCH)

## 2018-08-07 LAB — SAMPLE TO BLOOD BANK

## 2018-08-07 MED ORDER — DIPHENHYDRAMINE HCL 25 MG PO CAPS
ORAL_CAPSULE | ORAL | Status: AC
  Filled 2018-08-07: qty 1

## 2018-08-07 MED ORDER — SODIUM CHLORIDE 0.9% IV SOLUTION
250.0000 mL | Freq: Once | INTRAVENOUS | Status: AC
  Administered 2018-08-07: 250 mL via INTRAVENOUS
  Filled 2018-08-07: qty 250

## 2018-08-07 MED ORDER — DIPHENHYDRAMINE HCL 25 MG PO CAPS
25.0000 mg | ORAL_CAPSULE | Freq: Once | ORAL | Status: AC
  Administered 2018-08-07: 25 mg via ORAL

## 2018-08-07 MED ORDER — SODIUM CHLORIDE 0.9% FLUSH
10.0000 mL | Freq: Once | INTRAVENOUS | Status: AC
  Administered 2018-08-07: 10 mL
  Filled 2018-08-07: qty 10

## 2018-08-07 MED ORDER — SODIUM CHLORIDE 0.9% FLUSH
10.0000 mL | INTRAVENOUS | Status: AC | PRN
  Administered 2018-08-07: 10 mL
  Filled 2018-08-07: qty 10

## 2018-08-07 MED ORDER — FUROSEMIDE 10 MG/ML IJ SOLN
INTRAMUSCULAR | Status: AC
  Filled 2018-08-07: qty 2

## 2018-08-07 MED ORDER — ACETAMINOPHEN 325 MG PO TABS
ORAL_TABLET | ORAL | Status: AC
  Filled 2018-08-07: qty 2

## 2018-08-07 MED ORDER — ACETAMINOPHEN 325 MG PO TABS
650.0000 mg | ORAL_TABLET | Freq: Once | ORAL | Status: AC
  Administered 2018-08-07: 650 mg via ORAL

## 2018-08-07 MED ORDER — FUROSEMIDE 10 MG/ML IJ SOLN
20.0000 mg | Freq: Once | INTRAMUSCULAR | Status: AC
  Administered 2018-08-07: 20 mg via INTRAVENOUS

## 2018-08-07 MED ORDER — HEPARIN SOD (PORK) LOCK FLUSH 100 UNIT/ML IV SOLN
250.0000 [IU] | INTRAVENOUS | Status: AC | PRN
  Administered 2018-08-07: 18:00:00 250 [IU]
  Filled 2018-08-07: qty 5

## 2018-08-07 NOTE — Patient Instructions (Signed)
Blood Transfusion, Adult A blood transfusion is a procedure in which you are given blood through an IV tube. You may need this procedure because of:  Illness.  Surgery.  Injury. The blood may come from someone else (a donor). You may also be able to donate blood for yourself (autologous blood donation). The blood given in a transfusion is made up of different types of cells. You may get:  Red blood cells. These carry oxygen to the cells in the body.  White blood cells. These help you fight infections.  Platelets. These help your blood to clot.  Plasma. This is the liquid part of your blood. It helps with fluid imbalances. If you have a clotting disorder, you may also get other types of blood products. What happens before the procedure?  You will have a blood test to find out your blood type. The test also finds out what type of blood your body will accept and matches it to the donor type.  If you are going to have a planned surgery, you may be able to donate your own blood. This may be done in case you need a transfusion.  If you have had an allergic reaction to a transfusion in the past, you may be given medicine to help prevent a reaction. This medicine may be given to you by mouth or through an IV.  You will have your temperature, blood pressure, and pulse checked.  Follow instructions from your doctor about what you cannot eat or drink.  Ask your doctor about: ? Changing or stopping your regular medicines. This is important if you take diabetes medicines or blood thinners. ? Taking medicines such as aspirin and ibuprofen. These medicines can thin your blood. Do not take these medicines before your procedure if your doctor tells you not to. What happens during the procedure?  An IV tube will be put into one of your veins.  The bag of donated blood will be attached to your IV tube. Then, the blood will enter through your vein.  Your temperature, blood pressure, and pulse will  be checked regularly during the procedure. This is done to find early signs of a transfusion reaction.  If you have any signs or symptoms of a reaction, your transfusion will be stopped. You may also be given medicine.  When the transfusion is done, your IV tube will be taken out.  Pressure may be applied to the IV site for a few minutes.  A bandage (dressing) will be put on the IV site. The procedure may vary among doctors and hospitals. What happens after the procedure?  Your temperature, blood pressure, heart rate, breathing rate, and blood oxygen level will be checked often.  Your blood may be tested to see how you are responding to the transfusion.  You may be warmed with fluids or blankets. This is done to keep the temperature of your body normal. Summary  A blood transfusion is a procedure in which you are given blood through an IV tube.  The blood may come from someone else (a donor). You may also be able to donate blood for yourself.  If you have had an allergic reaction to a transfusion in the past, you may be given medicine to help prevent a reaction. This medicine may be given to you by mouth or through an IV tube.  Your temperature, blood pressure, heart rate, breathing rate, and blood oxygen level will be checked often.  Your blood may be tested to see   how you are responding to the transfusion. This information is not intended to replace advice given to you by your health care provider. Make sure you discuss any questions you have with your health care provider. Document Released: 04/14/2008 Document Revised: 03/08/2016 Document Reviewed: 09/10/2015 Elsevier Patient Education  2020 [employer]  

## 2018-08-07 NOTE — Progress Notes (Signed)
[facility] OFF[facility] PROGRESS NOTE  Patient Care Team: [name], MD as PCP - General (Family Medicine)  ASSESSMENT & PLAN:  Acute leukemia [facility]) The patient has made informed decision not to pursue palliative chemotherapy However, she is interested to get transfusion support as tolerated I will see her on a weekly basis for supportive care  Pancytopenia, acquired [facility]) She has symptomatic improvement after blood transfusion She is symptomatic again with chest heaviness due to anemia  She will continue low-dose prednisone therapy that seems to help to control the platelet count, improve energy and appetite She will get irradiated blood products Due to severe anemia, I recommend 2 units of blood today and she agreed to proceed  We discussed neutropenic precaution  Bilateral leg edema She has mild signs of fluid overload I recommend additional dose of Lasix today after blood   Orders Placed This Encounter  Procedures  . Prepare RBC    Standing Status:   Standing    Number of Occurrences:   1    Order Specific Question:   # of Units    Answer:   2 units    Order Specific Question:   Transfusion Indications    Answer:   Symptomatic Anemia    Order Specific Question:   Special Requirements    Answer:   Irradiated    Order Specific Question:   If emergent release call blood bank    Answer:   Not emergent release    INTERVAL HISTORY: Please see below for problem oriented charting. She returns for further follow-up She had PICC line placement 2 days ago She complained of some chest heaviness and mild shortness of breath No recent recurrence of skin rashes She continues to bruise easily She has significant bilateral lower extremity edema  SUMMARY OF ONCOLOGIC HISTORY: Oncology History  Acute leukemia ([facility])  07/24/2018 Bone Marrow Biopsy   Bone Marrow, Aspirate,Biopsy, and Clot - HYPERCELLULAR BONE MARROW WITH ACUTE LEUKEMIA - SEE COMMENT PERIPHERAL  BLOOD: - PANCYTOPENIA WITH CIRCULATING BLASTS Diagnosis Note The bone marrow is significantly involved by an acute leukemic process. 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 Correlation with cytogenetic and molecular studies is strongly recommended.   07/26/2018 Initial Diagnosis   Acute leukemia ([facility])     REVIEW OF SYSTEMS:   Constitutional: Denies fevers, chills or abnormal weight loss Eyes: Denies blurriness of vision Ears, nose, mouth, throat, and face: Denies mucositis or sore throat Cardiovascular: Denies palpitation, chest discomfort  Gastrointestinal:  Denies nausea, heartburn or change in bowel habits Skin: Denies abnormal skin rashes Lymphatics: Denies new lymphadenopathy or easy bruising Neurological:Denies numbness, tingling or new weaknesses Behavioral/Psych: Mood is stable, no new changes  All other systems were reviewed with the patient and are negative.  I have reviewed the past medical history, past surgical history, social history and family history with the patient and they are unchanged from previous note.  ALLERGIES:  is allergic to pine; celebrex [celecoxib]; and zyrtec [cetirizine].  MEDICATIONS:  Current Outpatient Medications  Medication Sig Dispense Refill  . Cholecalciferol (VITAMIN D) 50 MCG (2000 UT) tablet Take 2,000 Units by mouth daily.    Marland Kitchen escitalopram (LEXAPRO) 10 MG tablet Take 10 mg by mouth daily.  3  . furosemide (LASIX) 40 MG tablet TAKE 1 TABLET BY MOUTH EVERY DAY 30 tablet 1  . levothyroxine (SYNTHROID, LEVOTHROID) 100  MCG tablet Take 100 mcg by mouth daily before breakfast.    . Multiple Vitamins-Minerals (MULTIVITAMIN GUMMIES ADULT) CHEW Chew 1 tablet by mouth at bedtime.    . predniSONE (DELTASONE) 10 MG tablet Take 2 tablets (20 mg total) by mouth daily with breakfast. 60 tablet 1  .  spironolactone (ALDACTONE) 25 MG tablet TAKE 1 TABLET BY MOUTH EVERY DAY 30 tablet 1   No current facility-administered medications for this visit.    Facility-Administered Medications Ordered in Other Visits  Medication Dose Route Frequency Provider Last Rate Last Dose  . furosemide (LASIX) injection 20 mg  20 mg Intravenous Once Alvy Bimler, Damian Hofstra, MD      . heparin lock flush 100 unit/mL  250 Units Intracatheter PRN Huck Ashworth, MD      . sodium chloride flush (NS) 0.9 % injection 10 mL  10 mL Intracatheter PRN Alvy Bimler, Larayne Baxley, MD        PHYSICAL EXAMINATION: ECOG PERFORMANCE STATUS: 2 - Symptomatic, <50% confined to bed  Vitals:   08/07/18 1231  BP: (!) 136/56  Pulse: 80  Resp: 18  Temp: 98.5 F (36.9 C)  SpO2: 100%   Filed Weights   08/07/18 1231  Weight: 169 lb 6.4 oz (76.8 kg)    GENERAL:alert, no distress and comfortable SKIN: skin color, texture, turgor are normal, no rashes or significant lesions.  Noted skin bruises.  PICC line in situ EYES: normal, Conjunctiva are pink and non-injected, sclera clear OROPHARYNX:no exudate, no erythema and lips, buccal mucosa, and tongue normal  NECK: supple, thyroid normal size, non-tender, without nodularity LYMPH:  no palpable lymphadenopathy in the cervical, axillary or inguinal LUNGS: clear to auscultation and percussion with normal breathing effort HEART: regular rate & rhythm and no murmurs with moderate bilateral lower extremity edema ABDOMEN:abdomen soft, non-tender and normal bowel sounds Musculoskeletal:no cyanosis of digits and no clubbing  NEURO: alert & oriented x 3 with fluent speech, no focal motor/sensory deficits  LABORATORY DATA:  I have reviewed the data as listed    Component Value Date/Time   NA 139 08/07/2018 1125   K 3.6 08/07/2018 1125   CL 103 08/07/2018 1125   CO2 27 08/07/2018 1125   GLUCOSE 131 (H) 08/07/2018 1125   BUN 21 08/07/2018 1125   CREATININE 0.85 08/07/2018 1125   CREATININE 0.90 06/04/2018 1022    CALCIUM 8.6 (L) 08/07/2018 1125   PROT 6.3 (L) 08/07/2018 1125   ALBUMIN 3.9 08/07/2018 1125   AST 17 08/07/2018 1125   AST 28 06/04/2018 1022   ALT 16 08/07/2018 1125   ALT 40 06/04/2018 1022   ALKPHOS 27 (L) 08/07/2018 1125   BILITOT 1.5 (H) 08/07/2018 1125   BILITOT 1.3 (H) 06/04/2018 1022   GFRNONAA >60 08/07/2018 1125   GFRNONAA 60 (L) 06/04/2018 1022   GFRAA >60 08/07/2018 1125   GFRAA >60 06/04/2018 1022    No results found for: SPEP, UPEP  Lab Results  Component Value Date   WBC 1.0 (L) 08/07/2018   NEUTROABS 0.3 (LL) 08/07/2018   HGB 6.5 (LL) 08/07/2018   HCT 19.3 (L) 08/07/2018   MCV 102.7 (H) 08/07/2018   PLT 36 (L) 08/07/2018      Chemistry      Component Value Date/Time   NA 139 08/07/2018 1125   K 3.6 08/07/2018 1125   CL 103 08/07/2018 1125   CO2 27 08/07/2018 1125   BUN 21 08/07/2018 1125   CREATININE 0.85 08/07/2018 1125   CREATININE 0.90 06/04/2018 1022  Component Value Date/Time   CALCIUM 8.6 (L) 08/07/2018 1125   ALKPHOS 27 (L) 08/07/2018 1125   AST 17 08/07/2018 1125   AST 28 06/04/2018 1022   ALT 16 08/07/2018 1125   ALT 40 06/04/2018 1022   BILITOT 1.5 (H) 08/07/2018 1125   BILITOT 1.3 (H) 06/04/2018 1022       RADIOGRAPHIC STUDIES: I have personally reviewed the radiological images as listed and agreed with the findings in the report. Ir Picc Placement Right >5 Yrs Inc Img Guide  Result Date: 08/06/2018 INDICATION: 85 year old female referred for PICC placement EXAM: PICC LINE PLACEMENT WITH ULTRASOUND AND FLUOROSCOPIC GUIDANCE MEDICATIONS: None ANESTHESIA/SEDATION: None. FLUOROSCOPY TIME:  Fluoroscopy Time: 0 minutes 6 seconds (0.7 mGy). COMPLICATIONS: None PROCEDURE: Informed written consent was obtained from the patient after a thorough discussion of the procedural risks, benefits and alternatives. All questions were addressed. Maximal Sterile Barrier Technique was utilized including caps, mask, sterile gowns, sterile gloves,  sterile drape, hand hygiene and skin antiseptic. A timeout was performed prior to the initiation of the procedure. Patient was position in the supine position on the fluoroscopy table with the right arm abducted 90 degrees. Ultrasound survey of the upper extremity was performed with images stored and sent to PACs. The right brachial vein was selected for access. Once the patient was prepped and draped in the usual sterile fashion, the skin and subcutaneous tissues were generously infiltrated with 1% lidocaine for local anesthesia. A micropuncture access kit was then used to access the targeted vein. Wire was passed centrally, confirmed to be within the venous system under fluoroscopy. A small stab incision was made with an 11 blade scalpel and the sheath was then placed over the wire. Estimated length of the catheter was then performed with the indwelling wire. Catheter was amputated at 40 cm length and placed with coaxial wire through the peel-away. Double lumen, power injectable PICC in the brachial vein. Tip confirmed at the cavoatrial junction, and the catheter is ready for use. Stat lock was placed. Patient tolerated the procedure well and remained hemodynamically stable throughout. No complications were encountered and no significant blood loss was encountered. IMPRESSION: Status post right upper extremity double lumen power PICC. Catheter ready for use. Signed, Dulcy Fanny. Dellia Nims, RPVI [facility] Electronically Signed   By: Corrie Mckusick D.O.   On: 08/06/2018 09:47    All questions were answered. The patient knows to call the clinic with any problems, questions or concerns. No barriers to learning was detected.  I spent 25 minutes counseling the patient face to face. The total time spent in the appointment was 30 minutes and more than 50% was on counseling and review of test results  Heath Lark, MD 08/07/2018 2:25 PM

## 2018-08-07 NOTE — Patient Instructions (Signed)
PICC Home Care Guide ° °A peripherally inserted central catheter (PICC) is a form of IV access that allows medicines and IV fluids to be quickly distributed throughout the body. The PICC is a long, thin, flexible tube (catheter) that is inserted into a vein in the upper arm. The catheter ends in a large vein in the chest (superior vena cava, or SVC). After the PICC is inserted, a chest X-ray may be done to make sure that it is in the correct place. °A PICC may be placed for different reasons, such as: °· To give medicines and liquid nutrition. °· To give IV fluids and blood products. °· If there is trouble placing a peripheral intravenous (PIV) catheter. °If taken care of properly, a PICC can remain in place for several months. Having a PICC can also allow a person to go home from the hospital sooner. Medicine and PICC care can be managed at home by a family member, caregiver, or home health care team. °What are the risks? °Generally, having a PICC is safe. However, problems may occur, including: °· A blood clot (thrombus) forming in or at the tip of the PICC. °· A blood clot forming in a vein (deep vein thrombosis) or traveling to the lung (pulmonary embolism). °· Inflammation of the vein (phlebitis) in which the PICC is placed. °· Infection. Central line associated blood stream infection (CLABSI) is a serious infection that often requires hospitalization. °· PICC movement (malposition). The PICC tip may move from its original position due to excessive physical activity, forceful coughing, sneezing, or vomiting. °· A break or cut in the PICC. It is important not to use scissors near the PICC. °· Nerve or tendon irritation or injury during PICC insertion. °How to take care of your PICC °Preventing problems °· You and any caregivers should wash your hands often with soap. Wash hands: °? Before touching the PICC line or the infusion device. °? Before changing a bandage (dressing). °· Flush the PICC as told by your  health care provider. Let your health care provider know right away if the PICC is hard to flush or does not flush. Do not use force to flush the PICC. °· Do not use a syringe that is less than 10 mL to flush the PICC. °· Avoid blood pressure checks on the arm in which the PICC is placed. °· Never pull or tug on the PICC. °· Do not take the PICC out yourself. Only a trained clinical professional should remove the PICC. °· Use clean and sterile supplies only. Keep the supplies in a dry place. Do not reuse needles, syringes, or any other supplies. Doing that can lead to infection. °· Keep pets and children away from your PICC line. °· Check the PICC insertion site every day for signs of infection. Check for: °? Leakage. °? Redness, swelling, or pain. °? Fluid or blood. °? Warmth. °? Pus or a bad smell. °PICC dressing care °· Keep your PICC bandage (dressing) clean and dry to prevent infection. °· Do not take baths, swim, or use a hot tub until your health care provider approves. Ask your health care provider if you can take showers. You may only be allowed to take sponge baths for bathing. When you are allowed to shower: °? Ask your health care provider to teach you how to wrap the PICC line. °? Cover the PICC line with clear plastic wrap and tape to keep it dry while showering. °· Follow instructions from your health care provider   about how to take care of your insertion site and dressing. Make sure you: °? Wash your hands with soap and water before you change your bandage (dressing). If soap and water are not available, use hand sanitizer. °? Change your dressing as told by your health care provider. °? Leave stitches (sutures), skin glue, or adhesive strips in place. These skin closures may need to stay in place for 2 weeks or longer. If adhesive strip edges start to loosen and curl up, you may trim the loose edges. Do not remove adhesive strips completely unless your health care provider tells you to do  that. °· Change your PICC dressing if it becomes loose or wet. °General instructions ° °· Carry your PICC identification card or wear a medical alert bracelet at all times. °· Keep the tube clamped at all times, unless it is being used. °· Carry a smooth-edge clamp with you at all times to place on the tube if it breaks. °· Do not use scissors or sharp objects near the tube. °· You may bend your arm and move it freely. If your PICC is near or at the bend of your elbow, avoid activity with repeated motion at the elbow. °· Avoid lifting heavy objects as told by your health care provider. °· Keep all follow-up visits as told by your health care provider. This is important. °Disposal of supplies °· Throw away any syringes in a disposal container that is meant for sharp items (sharps container). You can buy a sharps container from a pharmacy, or you can make one by using an empty hard plastic bottle with a cover. °· Place any used dressings or infusion bags into a plastic bag. Throw that bag in the trash. °Contact a health care provider if: °· You have pain in your arm, ear, face, or teeth. °· You have a fever or chills. °· You have redness, swelling, or pain around the insertion site. °· You have fluid or blood coming from the insertion site. °· Your insertion site feels warm to the touch. °· You have pus or a bad smell coming from the insertion site. °· Your skin feels hard and raised around the insertion site. °Get help right away if: °· Your PICC is accidentally pulled all the way out. If this happens, cover the insertion site with a bandage or gauze dressing. Do not throw the PICC away. Your health care provider will need to check it. °· Your PICC was tugged or pulled and has partially come out. Do not  push the PICC back in. °· You cannot flush the PICC, it is hard to flush, or the PICC leaks around the insertion site when it is flushed. °· You hear a "flushing" sound when the PICC is flushed. °· You feel your  heart racing or skipping beats. °· There is a hole or tear in the PICC. °· You have swelling in the arm in which the PICC was inserted. °· You have a red streak going up your arm from where the PICC was inserted. °Summary °· A peripherally inserted central catheter (PICC) is a long, thin, flexible tube (catheter) that is inserted into a vein in the upper arm. °· The PICC is inserted using a sterile technique by a specially trained nurse or physician. Only a trained clinical professional should remove it. °· Keep your PICC identification card with you at all times. °· Avoid blood pressure checks on the arm in which the PICC is placed. °· If cared for   properly, a PICC can remain in place for several months. Having a PICC can also allow a person to go home from the hospital sooner. °This information is not intended to replace advice given to you by your health care provider. Make sure you discuss any questions you have with your health care provider. °Document Released: 07/23/2002 Document Revised: 12/29/2016 Document Reviewed: 02/19/2016 °Elsevier Patient Education © 2020 [employer] ° °

## 2018-08-07 NOTE — Assessment & Plan Note (Addendum)
She has symptomatic improvement after blood transfusion She is symptomatic again with chest heaviness due to anemia  She will continue low-dose prednisone therapy that seems to help to control the platelet count, improve energy and appetite She will get irradiated blood products Due to severe anemia, I recommend 2 units of blood today and she agreed to proceed  We discussed neutropenic precaution

## 2018-08-07 NOTE — Assessment & Plan Note (Signed)
She has mild signs of fluid overload I recommend additional dose of Lasix today after blood

## 2018-08-07 NOTE — Assessment & Plan Note (Signed)
The patient has made informed decision not to pursue palliative chemotherapy However, she is interested to get transfusion support as tolerated I will see her on a weekly basis for supportive care

## 2018-08-08 LAB — TYPE AND SCREEN
ABO/RH(D): A POS
Antibody Screen: NEGATIVE
Unit division: 0
Unit division: 0

## 2018-08-08 LAB — BPAM RBC
Blood Product Expiration Date: 202007312359
Blood Product Expiration Date: 202007312359
ISSUE DATE / TIME: 202007081333
ISSUE DATE / TIME: 202007081333
Unit Type and Rh: 6200
Unit Type and Rh: 6200

## 2018-08-14 ENCOUNTER — Inpatient Hospital Stay (HOSPITAL_BASED_OUTPATIENT_CLINIC_OR_DEPARTMENT_OTHER): Payer: Medicare Other | Admitting: Hematology and Oncology

## 2018-08-14 ENCOUNTER — Encounter: Payer: Self-pay | Admitting: Hematology and Oncology

## 2018-08-14 ENCOUNTER — Inpatient Hospital Stay: Payer: Medicare Other

## 2018-08-14 ENCOUNTER — Other Ambulatory Visit: Payer: Self-pay

## 2018-08-14 DIAGNOSIS — D61818 Other pancytopenia: Secondary | ICD-10-CM

## 2018-08-14 DIAGNOSIS — D539 Nutritional anemia, unspecified: Secondary | ICD-10-CM

## 2018-08-14 DIAGNOSIS — D649 Anemia, unspecified: Secondary | ICD-10-CM | POA: Diagnosis not present

## 2018-08-14 DIAGNOSIS — R600 Localized edema: Secondary | ICD-10-CM

## 2018-08-14 DIAGNOSIS — M1A00X0 Idiopathic chronic gout, unspecified site, without tophus (tophi): Secondary | ICD-10-CM

## 2018-08-14 DIAGNOSIS — D693 Immune thrombocytopenic purpura: Secondary | ICD-10-CM

## 2018-08-14 DIAGNOSIS — C9500 Acute leukemia of unspecified cell type not having achieved remission: Secondary | ICD-10-CM | POA: Diagnosis not present

## 2018-08-14 DIAGNOSIS — K140 Glossitis: Secondary | ICD-10-CM | POA: Diagnosis not present

## 2018-08-14 DIAGNOSIS — Z79899 Other long term (current) drug therapy: Secondary | ICD-10-CM | POA: Diagnosis not present

## 2018-08-14 DIAGNOSIS — E790 Hyperuricemia without signs of inflammatory arthritis and tophaceous disease: Secondary | ICD-10-CM

## 2018-08-14 LAB — COMPREHENSIVE METABOLIC PANEL
ALT: 17 U/L (ref 0–44)
AST: 15 U/L (ref 15–41)
Albumin: 3.9 g/dL (ref 3.5–5.0)
Alkaline Phosphatase: 30 U/L — ABNORMAL LOW (ref 38–126)
Anion gap: 10 (ref 5–15)
BUN: 22 mg/dL (ref 8–23)
CO2: 27 mmol/L (ref 22–32)
Calcium: 8.4 mg/dL — ABNORMAL LOW (ref 8.9–10.3)
Chloride: 103 mmol/L (ref 98–111)
Creatinine, Ser: 0.87 mg/dL (ref 0.44–1.00)
GFR calc Af Amer: >60 mL/min (ref >60)
GFR calc non Af Amer: >60 mL/min (ref >60)
Glucose, Bld: 133 mg/dL — ABNORMAL HIGH (ref 70–99)
Potassium: 4.1 mmol/L (ref 3.5–5.1)
Sodium: 140 mmol/L (ref 135–145)
Total Bilirubin: 1.2 mg/dL (ref 0.3–1.2)
Total Protein: 6.4 g/dL — ABNORMAL LOW (ref 6.5–8.1)

## 2018-08-14 LAB — CBC WITH DIFFERENTIAL/PLATELET
Abs Immature Granulocytes: 0 10*3/uL (ref 0.00–0.07)
Basophils Absolute: 0 10*3/uL (ref 0.0–0.1)
Basophils Relative: 0 %
Blasts: 49 %
Eosinophils Absolute: 0 10*3/uL (ref 0.0–0.5)
Eosinophils Relative: 0 %
HCT: 25.1 % — ABNORMAL LOW (ref 36.0–46.0)
Hemoglobin: 8.2 g/dL — ABNORMAL LOW (ref 12.0–15.0)
Lymphocytes Relative: 46 %
Lymphs Abs: 0.7 10*3/uL (ref 0.7–4.0)
MCH: 31.3 pg (ref 26.0–34.0)
MCHC: 32.7 g/dL (ref 30.0–36.0)
MCV: 95.8 fL (ref 80.0–100.0)
Monocytes Absolute: 0 10*3/uL — ABNORMAL LOW (ref 0.1–1.0)
Monocytes Relative: 3 %
Neutro Abs: 0 10*3/uL — CL (ref 1.7–17.7)
Neutrophils Relative %: 2 %
Platelets: 37 10*3/uL — ABNORMAL LOW (ref 150–400)
RBC: 2.62 MIL/uL — ABNORMAL LOW (ref 3.87–5.11)
RDW: 22.4 % — ABNORMAL HIGH (ref 11.5–15.5)
WBC: 1.6 10*3/uL — ABNORMAL LOW (ref 4.0–10.5)
nRBC: 1.9 % — ABNORMAL HIGH (ref 0.0–0.2)

## 2018-08-14 LAB — SAMPLE TO BLOOD BANK

## 2018-08-14 MED ORDER — SODIUM CHLORIDE 0.9% FLUSH
10.0000 mL | Freq: Once | INTRAVENOUS | Status: AC
  Administered 2018-08-14: 10 mL
  Filled 2018-08-14: qty 10

## 2018-08-14 NOTE — Assessment & Plan Note (Signed)
She has no signs of fluid overload She will continue current prescribed diuretic therapy

## 2018-08-14 NOTE — Assessment & Plan Note (Signed)
She has symptomatic improvement after blood transfusion She will continue low-dose prednisone therapy that seems to help to control the platelet count, improve energy and appetite She will get irradiated blood products She does not need blood or platelet transfusion today We discussed neutropenic precaution

## 2018-08-14 NOTE — Assessment & Plan Note (Signed)
The patient has made informed decision not to pursue palliative chemotherapy However, she is interested to get transfusion support as tolerated I will see her on a weekly basis for supportive care

## 2018-08-14 NOTE — Progress Notes (Signed)
[place] OFFICE PROGRESS NOTE  Patient Care Team: [name], MD as PCP - General (Family Medicine)  ASSESSMENT & PLAN:  Acute leukemia [facility]) The patient has made informed decision not to pursue palliative chemotherapy However, she is interested to get transfusion support as tolerated I will see her on a weekly basis for supportive care  Pancytopenia, acquired [facility]) She has symptomatic improvement after blood transfusion She will continue low-dose prednisone therapy that seems to help to control the platelet count, improve energy and appetite She will get irradiated blood products She does not need blood or platelet transfusion today We discussed neutropenic precaution  Bilateral leg edema She has no signs of fluid overload She will continue current prescribed diuretic therapy   No orders of the defined types were placed in this encounter.   INTERVAL HISTORY: Please see below for problem oriented charting. She returns for further follow-up She denies recent infection, fever or chills She felt better after recent transfusion She continues to have bilateral lower extremity edema Denies recent bleeding  SUMMARY OF ONCOLOGIC HISTORY: Oncology History  Acute leukemia ([facility])  07/24/2018 Bone Marrow Biopsy   Bone Marrow, Aspirate,Biopsy, and Clot - HYPERCELLULAR BONE MARROW WITH ACUTE LEUKEMIA - SEE COMMENT PERIPHERAL BLOOD: - PANCYTOPENIA WITH CIRCULATING BLASTS Diagnosis Note The bone marrow is significantly involved by an acute leukemic process. 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 Correlation with cytogenetic and molecular studies is strongly recommended.   07/26/2018 Initial Diagnosis   Acute leukemia ([facility])     REVIEW OF SYSTEMS:   Constitutional: Denies fevers, chills or abnormal weight loss Eyes:  Denies blurriness of vision Ears, nose, mouth, throat, and face: Denies mucositis or sore throat Respiratory: Denies cough, dyspnea or wheezes Cardiovascular: Denies palpitation, chest discomfort or lower extremity swelling Gastrointestinal:  Denies nausea, heartburn or change in bowel habits Skin: Denies abnormal skin rashes Lymphatics: Denies new lymphadenopathy or easy bruising Neurological:Denies numbness, tingling or new weaknesses Behavioral/Psych: Mood is stable, no new changes  All other systems were reviewed with the patient and are negative.  I have reviewed the past medical history, past surgical history, social history and family history with the patient and they are unchanged from previous note.  ALLERGIES:  is allergic to pine; celebrex [celecoxib]; and zyrtec [cetirizine].  MEDICATIONS:  Current Outpatient Medications  Medication Sig Dispense Refill  . Cholecalciferol (VITAMIN D) 50 MCG (2000 UT) tablet Take 2,000 Units by mouth daily.    Marland Kitchen escitalopram (LEXAPRO) 10 MG tablet Take 10 mg by mouth daily.  3  . furosemide (LASIX) 40 MG tablet TAKE 1 TABLET BY MOUTH EVERY DAY 30 tablet 1  . levothyroxine (SYNTHROID, LEVOTHROID) 100 MCG tablet Take 100 mcg by mouth daily before breakfast.    . Multiple Vitamins-Minerals (MULTIVITAMIN GUMMIES ADULT) CHEW Chew 1 tablet by mouth at bedtime.    . predniSONE (DELTASONE) 10 MG tablet Take 2 tablets (20 mg total) by mouth daily with breakfast. 60 tablet 1  . spironolactone (ALDACTONE) 25 MG tablet TAKE 1 TABLET BY MOUTH EVERY DAY 30 tablet 1   No current facility-administered medications for this visit.     PHYSICAL EXAMINATION: ECOG PERFORMANCE STATUS: 1 - Symptomatic but completely ambulatory  Vitals:   08/14/18 1213  BP: (!) 154/63  Pulse: 90  Resp: 17  Temp: 97.9 F (36.6 C)  SpO2: 98%   Filed  Weights   08/14/18 1213  Weight: 168 lb 8 oz (76.4 kg)    GENERAL:alert, no distress and comfortable SKIN: skin color,  texture, turgor are normal, no rashes or significant lesions EYES: normal, Conjunctiva are pink and non-injected, sclera clear OROPHARYNX:no exudate, no erythema and lips, buccal mucosa, and tongue normal  NECK: supple, thyroid normal size, non-tender, without nodularity LYMPH:  no palpable lymphadenopathy in the cervical, axillary or inguinal LUNGS: clear to auscultation and percussion with normal breathing effort HEART: regular rate & rhythm and no murmurs with moderate bilateral lower extremity edema ABDOMEN:abdomen soft, non-tender and normal bowel sounds Musculoskeletal:no cyanosis of digits and no clubbing  NEURO: alert & oriented x 3 with fluent speech, no focal motor/sensory deficits  LABORATORY DATA:  I have reviewed the data as listed    Component Value Date/Time   NA 140 08/14/2018 1148   K 4.1 08/14/2018 1148   CL 103 08/14/2018 1148   CO2 27 08/14/2018 1148   GLUCOSE 133 (H) 08/14/2018 1148   BUN 22 08/14/2018 1148   CREATININE 0.87 08/14/2018 1148   CREATININE 0.90 06/04/2018 1022   CALCIUM 8.4 (L) 08/14/2018 1148   PROT 6.4 (L) 08/14/2018 1148   ALBUMIN 3.9 08/14/2018 1148   AST 15 08/14/2018 1148   AST 28 06/04/2018 1022   ALT 17 08/14/2018 1148   ALT 40 06/04/2018 1022   ALKPHOS 30 (L) 08/14/2018 1148   BILITOT 1.2 08/14/2018 1148   BILITOT 1.3 (H) 06/04/2018 1022   GFRNONAA >60 08/14/2018 1148   GFRNONAA 60 (L) 06/04/2018 1022   GFRAA >60 08/14/2018 1148   GFRAA >60 06/04/2018 1022    No results found for: SPEP, UPEP  Lab Results  Component Value Date   WBC 1.6 (L) 08/14/2018   NEUTROABS 0.0 (LL) 08/14/2018   HGB 8.2 (L) 08/14/2018   HCT 25.1 (L) 08/14/2018   MCV 95.8 08/14/2018   PLT 37 (L) 08/14/2018      Chemistry      Component Value Date/Time   NA 140 08/14/2018 1148   K 4.1 08/14/2018 1148   CL 103 08/14/2018 1148   CO2 27 08/14/2018 1148   BUN 22 08/14/2018 1148   CREATININE 0.87 08/14/2018 1148   CREATININE 0.90 06/04/2018 1022       Component Value Date/Time   CALCIUM 8.4 (L) 08/14/2018 1148   ALKPHOS 30 (L) 08/14/2018 1148   AST 15 08/14/2018 1148   AST 28 06/04/2018 1022   ALT 17 08/14/2018 1148   ALT 40 06/04/2018 1022   BILITOT 1.2 08/14/2018 1148   BILITOT 1.3 (H) 06/04/2018 1022       RADIOGRAPHIC STUDIES: I have personally reviewed the radiological images as listed and agreed with the findings in the report. Ir Picc Placement Right >5 Yrs Inc Img Guide  Result Date: 08/06/2018 INDICATION: 85 year old female referred for PICC placement EXAM: PICC LINE PLACEMENT WITH ULTRASOUND AND FLUOROSCOPIC GUIDANCE MEDICATIONS: None ANESTHESIA/SEDATION: None. FLUOROSCOPY TIME:  Fluoroscopy Time: 0 minutes 6 seconds (0.7 mGy). COMPLICATIONS: None PROCEDURE: Informed written consent was obtained from the patient after a thorough discussion of the procedural risks, benefits and alternatives. All questions were addressed. Maximal Sterile Barrier Technique was utilized including caps, mask, sterile gowns, sterile gloves, sterile drape, hand hygiene and skin antiseptic. A timeout was performed prior to the initiation of the procedure. Patient was position in the supine position on the fluoroscopy table with the right arm abducted 90 degrees. Ultrasound survey of the upper extremity was performed  with images stored and sent to PACs. The right brachial vein was selected for access. Once the patient was prepped and draped in the usual sterile fashion, the skin and subcutaneous tissues were generously infiltrated with 1% lidocaine for local anesthesia. A micropuncture access kit was then used to access the targeted vein. Wire was passed centrally, confirmed to be within the venous system under fluoroscopy. A small stab incision was made with an 11 blade scalpel and the sheath was then placed over the wire. Estimated length of the catheter was then performed with the indwelling wire. Catheter was amputated at 40 cm length and placed with  coaxial wire through the peel-away. Double lumen, power injectable PICC in the brachial vein. Tip confirmed at the cavoatrial junction, and the catheter is ready for use. Stat lock was placed. Patient tolerated the procedure well and remained hemodynamically stable throughout. No complications were encountered and no significant blood loss was encountered. IMPRESSION: Status post right upper extremity double lumen power PICC. Catheter ready for use. Signed, Dulcy Fanny. Dellia Nims, RPVI [facility] Radiology Electronically Signed   By: Corrie Mckusick D.O.   On: 08/06/2018 09:47    All questions were answered. The patient knows to call the clinic with any problems, questions or concerns. No barriers to learning was detected.  I spent 15 minutes counseling the patient face to face. The total time spent in the appointment was 20 minutes and more than 50% was on counseling and review of test results  Heath Lark, MD 08/14/2018 1:44 PM

## 2018-08-21 ENCOUNTER — Inpatient Hospital Stay: Payer: Medicare Other

## 2018-08-21 ENCOUNTER — Other Ambulatory Visit: Payer: Self-pay

## 2018-08-21 ENCOUNTER — Inpatient Hospital Stay (HOSPITAL_BASED_OUTPATIENT_CLINIC_OR_DEPARTMENT_OTHER): Payer: Medicare Other | Admitting: Hematology and Oncology

## 2018-08-21 ENCOUNTER — Other Ambulatory Visit: Payer: Self-pay | Admitting: Hematology and Oncology

## 2018-08-21 VITALS — BP 121/52 | HR 67 | Temp 98.1°F | Resp 16

## 2018-08-21 DIAGNOSIS — R634 Abnormal weight loss: Secondary | ICD-10-CM

## 2018-08-21 DIAGNOSIS — D539 Nutritional anemia, unspecified: Secondary | ICD-10-CM

## 2018-08-21 DIAGNOSIS — K140 Glossitis: Secondary | ICD-10-CM

## 2018-08-21 DIAGNOSIS — E790 Hyperuricemia without signs of inflammatory arthritis and tophaceous disease: Secondary | ICD-10-CM

## 2018-08-21 DIAGNOSIS — D61818 Other pancytopenia: Secondary | ICD-10-CM

## 2018-08-21 DIAGNOSIS — D649 Anemia, unspecified: Secondary | ICD-10-CM | POA: Diagnosis not present

## 2018-08-21 DIAGNOSIS — R600 Localized edema: Secondary | ICD-10-CM | POA: Diagnosis not present

## 2018-08-21 DIAGNOSIS — C9500 Acute leukemia of unspecified cell type not having achieved remission: Secondary | ICD-10-CM | POA: Diagnosis not present

## 2018-08-21 DIAGNOSIS — M1A00X0 Idiopathic chronic gout, unspecified site, without tophus (tophi): Secondary | ICD-10-CM

## 2018-08-21 DIAGNOSIS — Z79899 Other long term (current) drug therapy: Secondary | ICD-10-CM | POA: Diagnosis not present

## 2018-08-21 DIAGNOSIS — D693 Immune thrombocytopenic purpura: Secondary | ICD-10-CM

## 2018-08-21 LAB — COMPREHENSIVE METABOLIC PANEL
ALT: 12 U/L (ref 0–44)
AST: 12 U/L — ABNORMAL LOW (ref 15–41)
Albumin: 4 g/dL (ref 3.5–5.0)
Alkaline Phosphatase: 34 U/L — ABNORMAL LOW (ref 38–126)
Anion gap: 13 (ref 5–15)
BUN: 27 mg/dL — ABNORMAL HIGH (ref 8–23)
CO2: 27 mmol/L (ref 22–32)
Calcium: 9.7 mg/dL (ref 8.9–10.3)
Chloride: 100 mmol/L (ref 98–111)
Creatinine, Ser: 1.18 mg/dL — ABNORMAL HIGH (ref 0.44–1.00)
GFR calc Af Amer: 50 mL/min — ABNORMAL LOW (ref >60)
GFR calc non Af Amer: 43 mL/min — ABNORMAL LOW (ref >60)
Glucose, Bld: 124 mg/dL — ABNORMAL HIGH (ref 70–99)
Potassium: 3.8 mmol/L (ref 3.5–5.1)
Sodium: 140 mmol/L (ref 135–145)
Total Bilirubin: 1.3 mg/dL — ABNORMAL HIGH (ref 0.3–1.2)
Total Protein: 6.8 g/dL (ref 6.5–8.1)

## 2018-08-21 LAB — CBC WITH DIFFERENTIAL/PLATELET
Abs Immature Granulocytes: 0 10*3/uL (ref 0.00–0.07)
Basophils Absolute: 0 10*3/uL (ref 0.0–0.1)
Basophils Relative: 0 %
Blasts: 77 %
Eosinophils Absolute: 0 10*3/uL (ref 0.0–0.5)
Eosinophils Relative: 0 %
HCT: 23.5 % — ABNORMAL LOW (ref 36.0–46.0)
Hemoglobin: 7.8 g/dL — ABNORMAL LOW (ref 12.0–15.0)
Lymphocytes Relative: 21 %
Lymphs Abs: 2.5 10*3/uL (ref 0.7–4.0)
MCH: 31.6 pg (ref 26.0–34.0)
MCHC: 33.2 g/dL (ref 30.0–36.0)
MCV: 95.1 fL (ref 80.0–100.0)
Monocytes Absolute: 0.1 10*3/uL (ref 0.1–1.0)
Monocytes Relative: 1 %
Neutro Abs: 0.1 10*3/uL — CL (ref 1.7–17.7)
Neutrophils Relative %: 1 %
Platelets: 28 10*3/uL — ABNORMAL LOW (ref 150–400)
RBC: 2.47 MIL/uL — ABNORMAL LOW (ref 3.87–5.11)
RDW: 23 % — ABNORMAL HIGH (ref 11.5–15.5)
WBC: 12.1 10*3/uL — ABNORMAL HIGH (ref 4.0–10.5)
nRBC: 1 % — ABNORMAL HIGH (ref 0.0–0.2)

## 2018-08-21 LAB — PREPARE RBC (CROSSMATCH)

## 2018-08-21 LAB — SAMPLE TO BLOOD BANK

## 2018-08-21 MED ORDER — AMOXICILLIN-POT CLAVULANATE 875-125 MG PO TABS: 1.0000 | ORAL_TABLET | Freq: Two times a day (BID) | ORAL | 0 refills | Status: DC

## 2018-08-21 MED ORDER — SODIUM CHLORIDE 0.9% FLUSH
10.0000 mL | Freq: Once | INTRAVENOUS | Status: AC
  Administered 2018-08-21: 10 mL
  Filled 2018-08-21: qty 10

## 2018-08-21 MED ORDER — HYDROMORPHONE HCL 4 MG/ML IJ SOLN
1.0000 mg | Freq: Once | INTRAMUSCULAR | Status: AC
  Administered 2018-08-21: 1 mg via INTRAVENOUS
  Filled 2018-08-21: qty 1

## 2018-08-21 MED ORDER — DIPHENHYDRAMINE HCL 25 MG PO CAPS
ORAL_CAPSULE | ORAL | Status: AC
  Filled 2018-08-21: qty 2

## 2018-08-21 MED ORDER — SODIUM CHLORIDE 0.9 % IV SOLN
Freq: Once | INTRAVENOUS | Status: AC
  Administered 2018-08-21: 12:00:00 via INTRAVENOUS
  Filled 2018-08-21: qty 250

## 2018-08-21 MED ORDER — DIPHENHYDRAMINE HCL 25 MG PO CAPS: 25.0000 mg | ORAL_CAPSULE | Freq: Once | ORAL | Status: DC

## 2018-08-21 MED ORDER — ACETAMINOPHEN 325 MG PO TABS: 650.0000 mg | ORAL_TABLET | Freq: Once | ORAL | Status: DC

## 2018-08-21 MED ORDER — MORPHINE SULFATE (CONCENTRATE) 10 MG/0.5ML PO SOLN: 10.0000 mg | ORAL | 0 refills | Status: AC | PRN

## 2018-08-21 MED ORDER — SODIUM CHLORIDE 0.9% IV SOLUTION
250.0000 mL | Freq: Once | INTRAVENOUS | Status: DC
  Filled 2018-08-21: qty 250

## 2018-08-21 MED ORDER — DIPHENHYDRAMINE HCL 12.5 MG/5ML PO ELIX
ORAL_SOLUTION | ORAL | Status: AC
  Filled 2018-08-21: qty 10

## 2018-08-21 MED ORDER — ACETAMINOPHEN 160 MG/5ML PO SOLN
650.0000 mg | Freq: Once | ORAL | Status: AC
  Administered 2018-08-21: 650 mg via ORAL

## 2018-08-21 MED ORDER — HYDROMORPHONE HCL 1 MG/ML IJ SOLN
INTRAMUSCULAR | Status: AC
  Filled 2018-08-21: qty 1

## 2018-08-21 MED ORDER — DIPHENHYDRAMINE HCL 12.5 MG/5ML PO ELIX
25.0000 mg | ORAL_SOLUTION | Freq: Once | ORAL | Status: AC
  Administered 2018-08-21: 14:00:00 25 mg via ORAL

## 2018-08-21 MED ORDER — ACETAMINOPHEN 160 MG/5ML PO SOLN
ORAL | Status: AC
  Filled 2018-08-21: qty 20.3

## 2018-08-21 MED ORDER — HEPARIN SOD (PORK) LOCK FLUSH 100 UNIT/ML IV SOLN
500.0000 [IU] | Freq: Once | INTRAVENOUS | Status: AC
  Administered 2018-08-21: 500 [IU]
  Filled 2018-08-21: qty 5

## 2018-08-21 MED ORDER — ACETAMINOPHEN 325 MG PO TABS
ORAL_TABLET | ORAL | Status: AC
  Filled 2018-08-21: qty 2

## 2018-08-21 MED ORDER — MORPHINE SULFATE (CONCENTRATE) 10 MG/0.5ML PO SOLN: 10.0000 mg | ORAL | 0 refills | Status: DC | PRN

## 2018-08-21 MED FILL — MORPHINE SULF 100 MG/5 ML S: 100 | 20 days supply | Qty: 120 | Fill #0

## 2018-08-21 MED FILL — AMOX-CLAV 875-125 MG TABLET: 875-125 | 7 days supply | Qty: 14 | Fill #0

## 2018-08-21 NOTE — Patient Instructions (Signed)
Blood Transfusion, Adult, Care After This sheet gives you information about how to care for yourself after your procedure. Your doctor may also give you more specific instructions. If you have problems or questions, contact your doctor. Follow these instructions at home:   Take over-the-counter and prescription medicines only as told by your doctor.  Go back to your normal activities as told by your doctor.  Follow instructions from your doctor about how to take care of the area where an IV tube was put into your vein (insertion site). Make sure you: ? Wash your hands with soap and water before you change your bandage (dressing). If there is no soap and water, use hand sanitizer. ? Change your bandage as told by your doctor.  Check your IV insertion site every day for signs of infection. Check for: ? More redness, swelling, or pain. ? More fluid or blood. ? Warmth. ? Pus or a bad smell. Contact a doctor if:  You have more redness, swelling, or pain around the IV insertion site.  You have more fluid or blood coming from the IV insertion site.  Your IV insertion site feels warm to the touch.  You have pus or a bad smell coming from the IV insertion site.  Your pee (urine) turns pink, red, or brown.  You feel weak after doing your normal activities. Get help right away if:  You have signs of a serious allergic or body defense (immune) system reaction, including: ? Itchiness. ? Hives. ? Trouble breathing. ? Anxiety. ? Pain in your chest or lower back. ? Fever, flushing, and chills. ? Fast pulse. ? Rash. ? Watery poop (diarrhea). ? Throwing up (vomiting). ? Dark pee. ? Serious headache. ? Dizziness. ? Stiff neck. ? Yellow color in your face or the white parts of your eyes (jaundice). Summary  After a blood transfusion, return to your normal activities as told by your doctor.  Every day, check for signs of infection where the IV tube was put into your vein.  Some  signs of infection are warm skin, more redness and pain, more fluid or blood, and pus or a bad smell where the needle went in.  Contact your doctor if you feel weak or have any unusual symptoms. This information is not intended to replace advice given to you by your health care provider. Make sure you discuss any questions you have with your health care provider. Document Released: 02/06/2014 Document Revised: 05/23/2017 Document Reviewed: 09/10/2015 Elsevier Patient Education  [place].  Rehydration, Adult Rehydration is the replacement of body fluids and salts and minerals (electrolytes) that are lost during dehydration. Dehydration is when there is not enough fluid or water in the body. This happens when you lose more fluids than you take in. Common causes of dehydration include:  Vomiting.  Diarrhea.  Excessive sweating, such as from heat exposure or exercise.  Taking medicines that cause the body to lose excess fluid (diuretics).  Impaired kidney function.  Not drinking enough fluid.  Certain illnesses or infections.  Certain poorly controlled long-term (chronic) illnesses, such as diabetes, heart disease, and kidney disease.  Symptoms of mild dehydration may include thirst, dry lips and mouth, dry skin, and dizziness. Symptoms of severe dehydration may include increased heart rate, confusion, fainting, and not urinating. You can rehydrate by drinking certain fluids or getting fluids through an IV tube, as told by your health care provider. What are the risks? Generally, rehydration is safe. However, one problem that can  happen is taking in too much fluid (overhydration). This is rare. If overhydration happens, it can cause an electrolyte imbalance, kidney failure, or a decrease in salt (sodium) levels in the body. How to rehydrate Follow instructions from your health care provider for rehydration. The kind of fluid you should drink and the amount you should drink depend  on your condition.  If directed by your health care provider, drink an oral rehydration solution (ORS). This is a drink designed to treat dehydration that is found in pharmacies and retail stores. ? Make an ORS by following instructions on the package. ? Start by drinking small amounts, about  cup (120 mL) every 5-10 minutes. ? Slowly increase how much you drink until you have taken the amount recommended by your health care provider.  Drink enough clear fluids to keep your urine clear or pale yellow. If you were instructed to drink an ORS, finish the ORS first, then start slowly drinking other clear fluids. Drink fluids such as: ? Water. Do not drink only water. Doing that can lead to having too little sodium in your body (hyponatremia). ? Ice chips. ? Fruit juice that you have added water to (diluted juice). ? Low-calorie sports drinks.  If you are severely dehydrated, your health care provider may recommend that you receive fluids through an IV tube in the hospital.  Do not take sodium tablets. Doing that can lead to the condition of having too much sodium in your body (hypernatremia). Eating while you rehydrate Follow instructions from your health care provider about what to eat while you rehydrate. Your health care provider may recommend that you slowly begin eating regular foods in small amounts.  Eat foods that contain a healthy balance of electrolytes, such as bananas, oranges, potatoes, tomatoes, and spinach.  Avoid foods that are greasy or contain a lot of fat or sugar.  In some cases, you may get nutrition through a feeding tube that is passed through your nose and into your stomach (nasogastric tube, or NG tube). This may be done if you have uncontrolled vomiting or diarrhea. Beverages to avoid Certain beverages may make dehydration worse. While you rehydrate, avoid:  Alcohol.  Caffeine.  Drinks that contain a lot of sugar. These include: ? High-calorie sports drinks. ?  Fruit juice that is not diluted. ? Soda.  Check nutrition labels to see how much sugar or caffeine a beverage contains. Signs of dehydration recovery You may be recovering from dehydration if:  You are urinating more often than before you started rehydrating.  Your urine is clear or pale yellow.  Your energy level improves.  You vomit less frequently.  You have diarrhea less frequently.  Your appetite improves or returns to normal.  You feel less dizzy or less light-headed.  Your skin tone and color start to look more normal. Contact a health care provider if:  You continue to have symptoms of mild dehydration, such as: ? Thirst. ? Dry lips. ? Slightly dry mouth. ? Dry, warm skin. ? Dizziness.  You continue to vomit or have diarrhea. Get help right away if:  You have symptoms of dehydration that get worse.  You feel: ? Confused. ? Weak. ? Like you are going to faint.  You have not urinated in 6-8 hours.  You have very dark urine.  You have trouble breathing.  Your heart rate while sitting still is over 100 beats a minute.  You cannot drink fluids without vomiting.  You have vomiting or diarrhea that: ?  Gets worse. ? Does not go away.  You have a fever. This information is not intended to replace advice given to you by your health care provider. Make sure you discuss any questions you have with your health care provider. Document Released: 04/10/2011 Document Revised: 12/29/2016 Document Reviewed: 03/12/2015 Elsevier Patient Education  2020 [employer].

## 2018-08-21 NOTE — Progress Notes (Signed)
1117am - Critical Lab received- ANC 0.1 1120am - Called to Harrel Lemon, RN with repeat back.

## 2018-08-22 ENCOUNTER — Other Ambulatory Visit: Payer: Self-pay | Admitting: Hematology and Oncology

## 2018-08-22 ENCOUNTER — Encounter: Payer: Self-pay | Admitting: Hematology and Oncology

## 2018-08-22 DIAGNOSIS — R634 Abnormal weight loss: Secondary | ICD-10-CM | POA: Insufficient documentation

## 2018-08-22 MED FILL — AMOX-CLAV 400-57 MG/5 ML SU: 400-57 | 10 days supply | Qty: 200 | Fill #0

## 2018-08-22 NOTE — Assessment & Plan Note (Signed)
She is symptomatic from anemia I recommend blood transfusion She does not need platelet transfusion unless she has bleeding We discussed some of the risks, benefits, and alternatives of blood transfusions. The patient is symptomatic from anemia and the hemoglobin level is critically low.  Some of the side-effects to be expected including risks of transfusion reactions, chills, infection, syndrome of volume overload and risk of hospitalization from various reasons and the patient is willing to proceed and went ahead to sign consent today.

## 2018-08-22 NOTE — Assessment & Plan Note (Signed)
The patient has made informed decision not to pursue palliative chemotherapy However, she is interested to get transfusion support as tolerated I will see her on a weekly basis for supportive care

## 2018-08-22 NOTE — Progress Notes (Signed)
[place] OFFICE PROGRESS NOTE  Patient Care Team: [name], MD as PCP - General (Family Medicine)  ASSESSMENT & PLAN:  Acute leukemia [facility]) The patient has made informed decision not to pursue palliative chemotherapy However, she is interested to get transfusion support as tolerated I will see her on a weekly basis for supportive care  Pancytopenia, acquired ([place]) She is symptomatic from anemia I recommend blood transfusion She does not need platelet transfusion unless she has bleeding We discussed some of the risks, benefits, and alternatives of blood transfusions. The patient is symptomatic from anemia and the hemoglobin level is critically low.  Some of the side-effects to be expected including risks of transfusion reactions, chills, infection, syndrome of volume overload and risk of hospitalization from various reasons and the patient is willing to proceed and went ahead to sign consent today.   Tongue infection She has signs and symptoms of tongue infection I recommend prescription pain medicine for pain as well as antibiotics to treat the infection She agreed with the plan of care I will see her next week for further evaluation  Weight loss She has significant weight loss in 1 week due to poor oral intake from pain I recommend holding off all diuretic therapy until she regained back some weight   Orders Placed This Encounter  Procedures  . Prepare RBC    Standing Status:   Standing    Number of Occurrences:   1    Order Specific Question:   # of Units    Answer:   1 unit    Order Specific Question:   Transfusion Indications    Answer:   Symptomatic Anemia    Order Specific Question:   Special Requirements    Answer:   Irradiated    Order Specific Question:   If emergent release call blood bank    Answer:   Not emergent release    INTERVAL HISTORY: Please see below for problem oriented charting. The patient is seen and her husband was  subsequently contacted over the telephone Over the past few days, she developed infection in her tongue and severe pain in the left side of her neck She have difficulties with swallowing and eating and have lost a lot of weight She felt fatigued The patient denies any recent signs or symptoms of bleeding such as spontaneous epistaxis, hematuria or hematochezia. She denies fever or chills  SUMMARY OF ONCOLOGIC HISTORY: Oncology History  Acute leukemia ([place])  07/24/2018 Bone Marrow Biopsy   Bone Marrow, Aspirate,Biopsy, and Clot - HYPERCELLULAR BONE MARROW WITH ACUTE LEUKEMIA - SEE COMMENT PERIPHERAL BLOOD: - PANCYTOPENIA WITH CIRCULATING BLASTS Diagnosis Note The bone marrow is significantly involved by an acute leukemic process. 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 Correlation with cytogenetic and molecular studies is strongly recommended.   07/26/2018 Initial Diagnosis   Acute leukemia ([facility])     REVIEW OF SYSTEMS:   Eyes: Denies blurriness of vision Respiratory: Denies cough, dyspnea or wheezes Gastrointestinal:  Denies nausea, heartburn or change in bowel habits Skin: Denies abnormal skin rashes Lymphatics: Denies new lymphadenopathy or easy bruising Behavioral/Psych: Mood is stable, no new changes  All other systems were reviewed with the patient and are negative.  I have reviewed the past medical history, past surgical history, social history and family history with the patient and they are unchanged from previous  note.  ALLERGIES:  is allergic to pine; celebrex [celecoxib]; and zyrtec [cetirizine].  MEDICATIONS:  Current Outpatient Medications  Medication Sig Dispense Refill  . amoxicillin-clavulanate (AUGMENTIN) 875-125 MG tablet Take 1 tablet by mouth 2 (two) times daily. 14 tablet 0  . Cholecalciferol (VITAMIN D) 50 MCG  (2000 UT) tablet Take 2,000 Units by mouth daily.    Marland Kitchen escitalopram (LEXAPRO) 10 MG tablet Take 10 mg by mouth daily.  3  . furosemide (LASIX) 40 MG tablet TAKE 1 TABLET BY MOUTH EVERY DAY 30 tablet 1  . levothyroxine (SYNTHROID, LEVOTHROID) 100 MCG tablet Take 100 mcg by mouth daily before breakfast.    . Morphine Sulfate (MORPHINE CONCENTRATE) 10 MG/0.5ML SOLN concentrated solution Take 0.5 mLs (10 mg total) by mouth every 2 (two) hours as needed. 120 mL 0  . Multiple Vitamins-Minerals (MULTIVITAMIN GUMMIES ADULT) CHEW Chew 1 tablet by mouth at bedtime.    . predniSONE (DELTASONE) 10 MG tablet Take 2 tablets (20 mg total) by mouth daily with breakfast. 60 tablet 1  . spironolactone (ALDACTONE) 25 MG tablet TAKE 1 TABLET BY MOUTH EVERY DAY 30 tablet 1   No current facility-administered medications for this visit.     PHYSICAL EXAMINATION: ECOG PERFORMANCE STATUS: 2 - Symptomatic, <50% confined to bed  Vitals:   08/21/18 1054  BP: 136/71  Pulse: 93  Resp: 16  Temp: (!) 97.2 F (36.2 C)  SpO2: 97%   Filed Weights   08/21/18 1054  Weight: 160 lb 3.2 oz (72.7 kg)    GENERAL:alert, no distress and comfortable.  She looks pale SKIN: skin color, texture, turgor are normal, no rashes or significant lesions EYES: normal, Conjunctiva are pink and non-injected, sclera clear OROPHARYNX: She have a blister and swelling on the left side of her tongue NECK: supple, thyroid normal size, non-tender, without nodularity LYMPH: She has palpable lymphadenopathy on the left side of her neck most likely due to infection LUNGS: clear to auscultation and percussion with normal breathing effort HEART: regular rate & rhythm and no murmurs with persistent chronic bilateral lower extremity edema ABDOMEN:abdomen soft, non-tender and normal bowel sounds Musculoskeletal:no cyanosis of digits and no clubbing  NEURO: alert & oriented x 3 with fluent speech, no focal motor/sensory deficits  LABORATORY DATA:   I have reviewed the data as listed    Component Value Date/Time   NA 140 08/21/2018 1024   K 3.8 08/21/2018 1024   CL 100 08/21/2018 1024   CO2 27 08/21/2018 1024   GLUCOSE 124 (H) 08/21/2018 1024   BUN 27 (H) 08/21/2018 1024   CREATININE 1.18 (H) 08/21/2018 1024   CREATININE 0.90 06/04/2018 1022   CALCIUM 9.7 08/21/2018 1024   PROT 6.8 08/21/2018 1024   ALBUMIN 4.0 08/21/2018 1024   AST 12 (L) 08/21/2018 1024   AST 28 06/04/2018 1022   ALT 12 08/21/2018 1024   ALT 40 06/04/2018 1022   ALKPHOS 34 (L) 08/21/2018 1024   BILITOT 1.3 (H) 08/21/2018 1024   BILITOT 1.3 (H) 06/04/2018 1022   GFRNONAA 43 (L) 08/21/2018 1024   GFRNONAA 60 (L) 06/04/2018 1022   GFRAA 50 (L) 08/21/2018 1024   GFRAA >60 06/04/2018 1022    No results found for: SPEP, UPEP  Lab Results  Component Value Date   WBC 12.1 (H) 08/21/2018   NEUTROABS 0.1 (LL) 08/21/2018   HGB 7.8 (L) 08/21/2018   HCT 23.5 (L) 08/21/2018   MCV 95.1 08/21/2018   PLT 28 (L) 08/21/2018  Chemistry      Component Value Date/Time   NA 140 08/21/2018 1024   K 3.8 08/21/2018 1024   CL 100 08/21/2018 1024   CO2 27 08/21/2018 1024   BUN 27 (H) 08/21/2018 1024   CREATININE 1.18 (H) 08/21/2018 1024   CREATININE 0.90 06/04/2018 1022      Component Value Date/Time   CALCIUM 9.7 08/21/2018 1024   ALKPHOS 34 (L) 08/21/2018 1024   AST 12 (L) 08/21/2018 1024   AST 28 06/04/2018 1022   ALT 12 08/21/2018 1024   ALT 40 06/04/2018 1022   BILITOT 1.3 (H) 08/21/2018 1024   BILITOT 1.3 (H) 06/04/2018 1022       RADIOGRAPHIC STUDIES: I have personally reviewed the radiological images as listed and agreed with the findings in the report. Ir Picc Placement Right >5 Yrs Inc Img Guide  Result Date: 08/06/2018 INDICATION: 85 year old female referred for PICC placement EXAM: PICC LINE PLACEMENT WITH ULTRASOUND AND FLUOROSCOPIC GUIDANCE MEDICATIONS: None ANESTHESIA/SEDATION: None. FLUOROSCOPY TIME:  Fluoroscopy Time: 0 minutes 6  seconds (0.7 mGy). COMPLICATIONS: None PROCEDURE: Informed written consent was obtained from the patient after a thorough discussion of the procedural risks, benefits and alternatives. All questions were addressed. Maximal Sterile Barrier Technique was utilized including caps, mask, sterile gowns, sterile gloves, sterile drape, hand hygiene and skin antiseptic. A timeout was performed prior to the initiation of the procedure. Patient was position in the supine position on the fluoroscopy table with the right arm abducted 90 degrees. Ultrasound survey of the upper extremity was performed with images stored and sent to PACs. The right brachial vein was selected for access. Once the patient was prepped and draped in the usual sterile fashion, the skin and subcutaneous tissues were generously infiltrated with 1% lidocaine for local anesthesia. A micropuncture access kit was then used to access the targeted vein. Wire was passed centrally, confirmed to be within the venous system under fluoroscopy. A small stab incision was made with an 11 blade scalpel and the sheath was then placed over the wire. Estimated length of the catheter was then performed with the indwelling wire. Catheter was amputated at 40 cm length and placed with coaxial wire through the peel-away. Double lumen, power injectable PICC in the brachial vein. Tip confirmed at the cavoatrial junction, and the catheter is ready for use. Stat lock was placed. Patient tolerated the procedure well and remained hemodynamically stable throughout. No complications were encountered and no significant blood loss was encountered. IMPRESSION: Status post right upper extremity double lumen power PICC. Catheter ready for use. Signed, Dulcy Fanny. Dellia Nims, RPVI [facility] Electronically Signed   By: Corrie Mckusick D.O.   On: 08/06/2018 09:47    All questions were answered. The patient knows to call the clinic with  any problems, questions or concerns. No barriers to learning was detected.  I spent 30 minutes counseling the patient face to face. The total time spent in the appointment was 40 minutes and more than 50% was on counseling and review of test results  Heath Lark, MD 08/22/2018 1:22 PM

## 2018-08-22 NOTE — Telephone Encounter (Signed)
pls call [facility]

## 2018-08-22 NOTE — Assessment & Plan Note (Signed)
She has significant weight loss in 1 week due to poor oral intake from pain I recommend holding off all diuretic therapy until she regained back some weight

## 2018-08-22 NOTE — Assessment & Plan Note (Signed)
She has signs and symptoms of tongue infection I recommend prescription pain medicine for pain as well as antibiotics to treat the infection She agreed with the plan of care I will see her next week for further evaluation

## 2018-08-23 LAB — TYPE AND SCREEN
ABO/RH(D): A POS
Antibody Screen: NEGATIVE
Unit division: 0

## 2018-08-23 LAB — BPAM RBC
Blood Product Expiration Date: 202008162359
ISSUE DATE / TIME: 202007221408
Unit Type and Rh: 6200

## 2018-08-28 ENCOUNTER — Inpatient Hospital Stay: Payer: Medicare Other

## 2018-08-28 ENCOUNTER — Other Ambulatory Visit: Payer: Self-pay

## 2018-08-28 ENCOUNTER — Encounter: Payer: Self-pay | Admitting: Hematology and Oncology

## 2018-08-28 ENCOUNTER — Other Ambulatory Visit: Payer: Self-pay | Admitting: *Deleted

## 2018-08-28 ENCOUNTER — Inpatient Hospital Stay (HOSPITAL_BASED_OUTPATIENT_CLINIC_OR_DEPARTMENT_OTHER): Payer: Medicare Other | Admitting: Hematology and Oncology

## 2018-08-28 ENCOUNTER — Telehealth: Payer: Self-pay | Admitting: *Deleted

## 2018-08-28 VITALS — HR 90 | Temp 98.4°F | Resp 18 | Ht 62.0 in

## 2018-08-28 DIAGNOSIS — C9500 Acute leukemia of unspecified cell type not having achieved remission: Secondary | ICD-10-CM

## 2018-08-28 DIAGNOSIS — Z7189 Other specified counseling: Secondary | ICD-10-CM

## 2018-08-28 DIAGNOSIS — D61818 Other pancytopenia: Secondary | ICD-10-CM | POA: Diagnosis not present

## 2018-08-28 DIAGNOSIS — R600 Localized edema: Secondary | ICD-10-CM | POA: Diagnosis not present

## 2018-08-28 DIAGNOSIS — Z79899 Other long term (current) drug therapy: Secondary | ICD-10-CM | POA: Diagnosis not present

## 2018-08-28 DIAGNOSIS — D539 Nutritional anemia, unspecified: Secondary | ICD-10-CM

## 2018-08-28 DIAGNOSIS — D693 Immune thrombocytopenic purpura: Secondary | ICD-10-CM

## 2018-08-28 DIAGNOSIS — D649 Anemia, unspecified: Secondary | ICD-10-CM | POA: Diagnosis not present

## 2018-08-28 DIAGNOSIS — K140 Glossitis: Secondary | ICD-10-CM | POA: Diagnosis not present

## 2018-08-28 DIAGNOSIS — E790 Hyperuricemia without signs of inflammatory arthritis and tophaceous disease: Secondary | ICD-10-CM

## 2018-08-28 DIAGNOSIS — M1A00X0 Idiopathic chronic gout, unspecified site, without tophus (tophi): Secondary | ICD-10-CM

## 2018-08-28 LAB — CBC WITH DIFFERENTIAL/PLATELET
Abs Immature Granulocytes: 0 10*3/uL (ref 0.00–0.07)
Basophils Absolute: 0 10*3/uL (ref 0.0–0.1)
Basophils Relative: 0 %
Blasts: 81 %
Eosinophils Absolute: 0 10*3/uL (ref 0.0–0.5)
Eosinophils Relative: 0 %
HCT: 22.7 % — ABNORMAL LOW (ref 36.0–46.0)
Hemoglobin: 7.4 g/dL — ABNORMAL LOW (ref 12.0–15.0)
Lymphocytes Relative: 17 %
Lymphs Abs: 4.6 10*3/uL — ABNORMAL HIGH (ref 0.7–4.0)
MCH: 31 pg (ref 26.0–34.0)
MCHC: 32.6 g/dL (ref 30.0–36.0)
MCV: 95 fL (ref 80.0–100.0)
Monocytes Absolute: 0.5 10*3/uL (ref 0.1–1.0)
Monocytes Relative: 2 %
Neutro Abs: 0 10*3/uL — CL (ref 1.7–17.7)
Neutrophils Relative %: 0 %
Platelets: 15 10*3/uL — ABNORMAL LOW (ref 150–400)
RBC: 2.39 MIL/uL — ABNORMAL LOW (ref 3.87–5.11)
RDW: 21.9 % — ABNORMAL HIGH (ref 11.5–15.5)
Smear Review: 1
WBC: 27.1 10*3/uL — ABNORMAL HIGH (ref 4.0–10.5)
nRBC: 0.6 % — ABNORMAL HIGH (ref 0.0–0.2)

## 2018-08-28 LAB — SAMPLE TO BLOOD BANK

## 2018-08-28 LAB — COMPREHENSIVE METABOLIC PANEL
ALT: 10 U/L (ref 0–44)
AST: 11 U/L — ABNORMAL LOW (ref 15–41)
Albumin: 3.5 g/dL (ref 3.5–5.0)
Alkaline Phosphatase: 29 U/L — ABNORMAL LOW (ref 38–126)
Anion gap: 9 (ref 5–15)
BUN: 17 mg/dL (ref 8–23)
CO2: 23 mmol/L (ref 22–32)
Calcium: 8.9 mg/dL (ref 8.9–10.3)
Chloride: 109 mmol/L (ref 98–111)
Creatinine, Ser: 0.92 mg/dL (ref 0.44–1.00)
GFR calc Af Amer: >60 mL/min (ref >60)
GFR calc non Af Amer: 58 mL/min — ABNORMAL LOW (ref >60)
Glucose, Bld: 101 mg/dL — ABNORMAL HIGH (ref 70–99)
Potassium: 4 mmol/L (ref 3.5–5.1)
Sodium: 141 mmol/L (ref 135–145)
Total Bilirubin: 0.6 mg/dL (ref 0.3–1.2)
Total Protein: 6.2 g/dL — ABNORMAL LOW (ref 6.5–8.1)

## 2018-08-28 LAB — PREPARE RBC (CROSSMATCH)

## 2018-08-28 MED ORDER — DIPHENHYDRAMINE HCL 25 MG PO CAPS
25.0000 mg | ORAL_CAPSULE | Freq: Once | ORAL | Status: AC
  Administered 2018-08-28: 10:00:00 25 mg via ORAL

## 2018-08-28 MED ORDER — ACETAMINOPHEN 325 MG PO TABS
650.0000 mg | ORAL_TABLET | Freq: Once | ORAL | Status: AC
  Administered 2018-08-28: 650 mg via ORAL

## 2018-08-28 MED ORDER — HEPARIN SOD (PORK) LOCK FLUSH 100 UNIT/ML IV SOLN
250.0000 [IU] | INTRAVENOUS | Status: AC | PRN
  Administered 2018-08-28: 250 [IU]
  Filled 2018-08-28: qty 5

## 2018-08-28 MED ORDER — DIPHENHYDRAMINE HCL 25 MG PO CAPS
ORAL_CAPSULE | ORAL | Status: AC
  Filled 2018-08-28: qty 1

## 2018-08-28 MED ORDER — ACETAMINOPHEN 325 MG PO TABS
ORAL_TABLET | ORAL | Status: AC
  Filled 2018-08-28: qty 2

## 2018-08-28 MED ORDER — SODIUM CHLORIDE 0.9% FLUSH
10.0000 mL | Freq: Once | INTRAVENOUS | Status: AC
  Administered 2018-08-28: 10 mL
  Filled 2018-08-28: qty 10

## 2018-08-28 MED ORDER — SODIUM CHLORIDE 0.9% FLUSH
3.0000 mL | INTRAVENOUS | Status: AC | PRN
  Administered 2018-08-28: 3 mL
  Filled 2018-08-28: qty 10

## 2018-08-28 MED ORDER — HYDROXYUREA 500 MG PO CAPS: 500.0000 mg | ORAL_CAPSULE | Freq: Every day | ORAL | 11 refills | Status: AC

## 2018-08-28 MED ORDER — SODIUM CHLORIDE 0.9% IV SOLUTION
250.0000 mL | Freq: Once | INTRAVENOUS | Status: AC
  Administered 2018-08-28: 250 mL via INTRAVENOUS
  Filled 2018-08-28: qty 250

## 2018-08-28 MED FILL — HYDROXYUREA 500 MG CAPSULE: 500 | 30 days supply | Qty: 30 | Fill #0

## 2018-08-28 NOTE — Patient Instructions (Signed)
Blood Transfusion, Adult, Care After This sheet gives you information about how to care for yourself after your procedure. Your doctor may also give you more specific instructions. If you have problems or questions, contact your doctor. Follow these instructions at home:   Take over-the-counter and prescription medicines only as told by your doctor.  Go back to your normal activities as told by your doctor.  Follow instructions from your doctor about how to take care of the area where an IV tube was put into your vein (insertion site). Make sure you: ? Wash your hands with soap and water before you change your bandage (dressing). If there is no soap and water, use hand sanitizer. ? Change your bandage as told by your doctor.  Check your IV insertion site every day for signs of infection. Check for: ? More redness, swelling, or pain. ? More fluid or blood. ? Warmth. ? Pus or a bad smell. Contact a doctor if:  You have more redness, swelling, or pain around the IV insertion site.  You have more fluid or blood coming from the IV insertion site.  Your IV insertion site feels warm to the touch.  You have pus or a bad smell coming from the IV insertion site.  Your pee (urine) turns pink, red, or brown.  You feel weak after doing your normal activities. Get help right away if:  You have signs of a serious allergic or body defense (immune) system reaction, including: ? Itchiness. ? Hives. ? Trouble breathing. ? Anxiety. ? Pain in your chest or lower back. ? Fever, flushing, and chills. ? Fast pulse. ? Rash. ? Watery poop (diarrhea). ? Throwing up (vomiting). ? Dark pee. ? Serious headache. ? Dizziness. ? Stiff neck. ? Yellow color in your face or the white parts of your eyes (jaundice). Summary  After a blood transfusion, return to your normal activities as told by your doctor.  Every day, check for signs of infection where the IV tube was put into your vein.  Some  signs of infection are warm skin, more redness and pain, more fluid or blood, and pus or a bad smell where the needle went in.  Contact your doctor if you feel weak or have any unusual symptoms. This information is not intended to replace advice given to you by your health care provider. Make sure you discuss any questions you have with your health care provider. Document Released: 02/06/2014 Document Revised: 05/23/2017 Document Reviewed: 09/10/2015 Elsevier Patient Education  2020 [employer]  

## 2018-08-28 NOTE — Assessment & Plan Note (Signed)
Her tongue infection is almost completely resolved I recommend she complete the current course of antibiotics treatment I also advised her to take liquid morphine as needed if she has persistent pain that affects her swallowing.

## 2018-08-28 NOTE — Assessment & Plan Note (Signed)
She is able to regain the weight she lost last week We will resume diuretic therapy

## 2018-08-28 NOTE — Telephone Encounter (Signed)
CRITICAL VALUE STICKER  CRITICAL VALUE: ANC = 0.0  RECEIVER (on-site recipient of call): Cherylynn Ridges RN, [facility] NOTIFIED: 08/28/2018 0948   MESSENGER (representative from lab): [place] Myles Gip  MD NOTIFIED: Dr. Alvy Bimler  TIME OF NOTIFICATION: 08/28/2018; 10:23 am  RESPONSE: None

## 2018-08-28 NOTE — Assessment & Plan Note (Signed)
We had discussions about goals of care The patient would like to continue transfusion support

## 2018-08-28 NOTE — Progress Notes (Signed)
[facility] OFFICE PROGRESS NOTE  Patient Care Team: [name], MD as PCP - General (Family Medicine)  ASSESSMENT & PLAN:  Acute leukemia [facility]) Unfortunately, her disease continue to progress She has worsening leukocytosis even though her infection has improved/resolved I recommend hydroxyurea in an attempt to control her leukemia process  The risks, benefits, side effects of hydroxyurea are discussed with the patient and she is willing to proceed It is likely that she will continue the trajectory of worsening pancytopenia in the future with worsening disease control  Pancytopenia, acquired ([place]) This is due to leukemia We will proceed with 1 unit of blood to keep hemoglobin greater than 8 I anticipate she will need more transfusion support including the possibility of platelet transfusion in the future I will try to extend her transfusion appointments in the future  Bilateral leg edema She is able to regain the weight she lost last week We will resume diuretic therapy  Tongue infection Her tongue infection is almost completely resolved I recommend she complete the current course of antibiotics treatment I also advised her to take liquid morphine as needed if she has persistent pain that affects her swallowing.  Goals of care, counseling/discussion We had discussions about goals of care The patient would like to continue transfusion support   Orders Placed This Encounter  Procedures  . Practitioner attestation of consent    I, the ordering practitioner, attest that I have discussed with the patient the benefits, risks, side effects, alternatives, likelihood of achieving goals and potential problems during recovery for the procedure listed.    Standing Status:   Future    Number of Occurrences:   1    Standing Expiration Date:   08/28/2019    Order Specific Question:   Procedure    Answer:   Blood Product(s)  . Complete patient signature process for consent  form    Standing Status:   Future    Number of Occurrences:   1    Standing Expiration Date:   08/28/2019  . Care order/instruction    Transfuse Parameters    Standing Status:   Future    Number of Occurrences:   1    Standing Expiration Date:   08/28/2019  . Type and screen    Standing Status:   Future    Number of Occurrences:   1    Standing Expiration Date:   08/28/2019  . Prepare RBC    Standing Status:   Standing    Number of Occurrences:   1    Order Specific Question:   # of Units    Answer:   1 unit    Order Specific Question:   Transfusion Indications    Answer:   Symptomatic Anemia    Order Specific Question:   Special Requirements    Answer:   Irradiated    Order Specific Question:   If emergent release call blood bank    Answer:   Not emergent release    INTERVAL HISTORY: Please see below for problem oriented charting. She returns for further follow-up She is doing much better She is able to eat and swallow without difficulties She denies fever or chills No recent bleeding She is able to gain some weight since last time I saw her She denies pain today  SUMMARY OF ONCOLOGIC HISTORY: Oncology History  Acute leukemia ([place])  07/24/2018 Bone Marrow Biopsy   Bone Marrow, Aspirate,Biopsy, and Clot - HYPERCELLULAR BONE MARROW WITH ACUTE LEUKEMIA -  SEE COMMENT PERIPHERAL BLOOD: - PANCYTOPENIA WITH CIRCULATING BLASTS Diagnosis Note The bone marrow is significantly involved by an acute leukemic process. 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 Correlation with cytogenetic and molecular studies is strongly recommended.   07/26/2018 Initial Diagnosis   Acute leukemia ([facility])     REVIEW OF SYSTEMS:   Constitutional: Denies fevers, chills or abnormal weight loss Eyes: Denies blurriness of vision Ears, nose, mouth, throat,  and face: Denies mucositis or sore throat Respiratory: Denies cough, dyspnea or wheezes Cardiovascular: Denies palpitation, chest discomfort  Gastrointestinal:  Denies nausea, heartburn or change in bowel habits Skin: Denies abnormal skin rashes Lymphatics: Denies new lymphadenopathy or easy bruising Neurological:Denies numbness, tingling or new weaknesses Behavioral/Psych: Mood is stable, no new changes  All other systems were reviewed with the patient and are negative.  I have reviewed the past medical history, past surgical history, social history and family history with the patient and they are unchanged from previous note.  ALLERGIES:  is allergic to pine; celebrex [celecoxib]; and zyrtec [cetirizine].  MEDICATIONS:  Current Outpatient Medications  Medication Sig Dispense Refill  . amoxicillin-clavulanate (AUGMENTIN) 875-125 MG tablet Take 1 tablet by mouth 2 (two) times daily. 14 tablet 0  . Cholecalciferol (VITAMIN D) 50 MCG (2000 UT) tablet Take 2,000 Units by mouth daily.    Marland Kitchen escitalopram (LEXAPRO) 10 MG tablet Take 10 mg by mouth daily.  3  . furosemide (LASIX) 40 MG tablet TAKE 1 TABLET BY MOUTH EVERY DAY 30 tablet 1  . hydroxyurea (HYDREA) 500 MG capsule Take 1 capsule (500 mg total) by mouth daily. May take with food to minimize GI side effects. 30 capsule 11  . levothyroxine (SYNTHROID, LEVOTHROID) 100 MCG tablet Take 100 mcg by mouth daily before breakfast.    . Morphine Sulfate (MORPHINE CONCENTRATE) 10 MG/0.5ML SOLN concentrated solution Take 0.5 mLs (10 mg total) by mouth every 2 (two) hours as needed. 120 mL 0  . Multiple Vitamins-Minerals (MULTIVITAMIN GUMMIES ADULT) CHEW Chew 1 tablet by mouth at bedtime.    . predniSONE (DELTASONE) 10 MG tablet Take 2 tablets (20 mg total) by mouth daily with breakfast. 60 tablet 1  . spironolactone (ALDACTONE) 25 MG tablet TAKE 1 TABLET BY MOUTH EVERY DAY 30 tablet 1   No current facility-administered medications for this visit.     Facility-Administered Medications Ordered in Other Visits  Medication Dose Route Frequency Provider Last Rate Last Dose  . acetaminophen (TYLENOL) tablet 650 mg  650 mg Oral Once Alvy Bimler, Levoy Geisen, MD      . diphenhydrAMINE (BENADRYL) capsule 25 mg  25 mg Oral Once Alvy Bimler, Edouard Gikas, MD      . heparin lock flush 100 unit/mL  250 Units Intracatheter PRN Lindell Tussey, MD      . sodium chloride flush (NS) 0.9 % injection 3 mL  3 mL Intracatheter PRN Jaylyne Breese, MD        PHYSICAL EXAMINATION: ECOG PERFORMANCE STATUS: 1 - Symptomatic but completely ambulatory  Vitals:   08/28/18 0836  Pulse: 90  Resp: 18  Temp: 98.4 F (36.9 C)  SpO2: 99%   There were no vitals filed for this visit.  GENERAL:alert, no distress and comfortable SKIN: skin color, texture, turgor are normal, no rashes or significant lesions EYES: normal, Conjunctiva are pink and non-injected, sclera clear OROPHARYNX:no exudate, no erythema and lips, buccal mucosa, and tongue  normal.  I do not see any signs of active infection NECK: supple, thyroid normal size, non-tender, without nodularity.  Her neck swelling from last week has resolved LYMPH:  no palpable lymphadenopathy in the cervical, axillary or inguinal LUNGS: clear to auscultation and percussion with normal breathing effort HEART: regular rate & rhythm and no murmurs and with moderate bilateral extremity edema ABDOMEN:abdomen soft, non-tender and normal bowel sounds Musculoskeletal:no cyanosis of digits and no clubbing  NEURO: alert & oriented x 3 with fluent speech, no focal motor/sensory deficits  LABORATORY DATA:  I have reviewed the data as listed    Component Value Date/Time   NA 141 08/28/2018 0815   K 4.0 08/28/2018 0815   CL 109 08/28/2018 0815   CO2 23 08/28/2018 0815   GLUCOSE 101 (H) 08/28/2018 0815   BUN 17 08/28/2018 0815   CREATININE 0.92 08/28/2018 0815   CREATININE 0.90 06/04/2018 1022   CALCIUM 8.9 08/28/2018 0815   PROT 6.2 (L) 08/28/2018 0815    ALBUMIN 3.5 08/28/2018 0815   AST 11 (L) 08/28/2018 0815   AST 28 06/04/2018 1022   ALT 10 08/28/2018 0815   ALT 40 06/04/2018 1022   ALKPHOS 29 (L) 08/28/2018 0815   BILITOT 0.6 08/28/2018 0815   BILITOT 1.3 (H) 06/04/2018 1022   GFRNONAA 58 (L) 08/28/2018 0815   GFRNONAA 60 (L) 06/04/2018 1022   GFRAA >60 08/28/2018 0815   GFRAA >60 06/04/2018 1022    No results found for: SPEP, UPEP  Lab Results  Component Value Date   WBC 27.1 (H) 08/28/2018   NEUTROABS PENDING 08/28/2018   HGB 7.4 (L) 08/28/2018   HCT 22.7 (L) 08/28/2018   MCV 95.0 08/28/2018   PLT 15 (L) 08/28/2018      Chemistry      Component Value Date/Time   NA 141 08/28/2018 0815   K 4.0 08/28/2018 0815   CL 109 08/28/2018 0815   CO2 23 08/28/2018 0815   BUN 17 08/28/2018 0815   CREATININE 0.92 08/28/2018 0815   CREATININE 0.90 06/04/2018 1022      Component Value Date/Time   CALCIUM 8.9 08/28/2018 0815   ALKPHOS 29 (L) 08/28/2018 0815   AST 11 (L) 08/28/2018 0815   AST 28 06/04/2018 1022   ALT 10 08/28/2018 0815   ALT 40 06/04/2018 1022   BILITOT 0.6 08/28/2018 0815   BILITOT 1.3 (H) 06/04/2018 1022       RADIOGRAPHIC STUDIES: I have personally reviewed the radiological images as listed and agreed with the findings in the report. Ir Picc Placement Right >5 Yrs Inc Img Guide  Result Date: 08/06/2018 INDICATION: 86 year old female referred for PICC placement EXAM: PICC LINE PLACEMENT WITH ULTRASOUND AND FLUOROSCOPIC GUIDANCE MEDICATIONS: None ANESTHESIA/SEDATION: None. FLUOROSCOPY TIME:  Fluoroscopy Time: 0 minutes 6 seconds (0.7 mGy). COMPLICATIONS: None PROCEDURE: Informed written consent was obtained from the patient after a thorough discussion of the procedural risks, benefits and alternatives. All questions were addressed. Maximal Sterile Barrier Technique was utilized including caps, mask, sterile gowns, sterile gloves, sterile drape, hand hygiene and skin antiseptic. A timeout was performed  prior to the initiation of the procedure. Patient was position in the supine position on the fluoroscopy table with the right arm abducted 90 degrees. Ultrasound survey of the upper extremity was performed with images stored and sent to PACs. The right brachial vein was selected for access. Once the patient was prepped and draped in the usual sterile fashion, the skin and subcutaneous tissues were generously  infiltrated with 1% lidocaine for local anesthesia. A micropuncture access kit was then used to access the targeted vein. Wire was passed centrally, confirmed to be within the venous system under fluoroscopy. A small stab incision was made with an 11 blade scalpel and the sheath was then placed over the wire. Estimated length of the catheter was then performed with the indwelling wire. Catheter was amputated at 40 cm length and placed with coaxial wire through the peel-away. Double lumen, power injectable PICC in the brachial vein. Tip confirmed at the cavoatrial junction, and the catheter is ready for use. Stat lock was placed. Patient tolerated the procedure well and remained hemodynamically stable throughout. No complications were encountered and no significant blood loss was encountered. IMPRESSION: Status post right upper extremity double lumen power PICC. Catheter ready for use. Signed, Dulcy Fanny. Dellia Nims, RPVI [facility] Electronically Signed   By: Corrie Mckusick D.O.   On: 08/06/2018 09:47    All questions were answered. The patient knows to call the clinic with any problems, questions or concerns. No barriers to learning was detected.  I spent 25 minutes counseling the patient face to face. The total time spent in the appointment was 40 minutes and more than 50% was on counseling and review of test results  Heath Lark, MD 08/28/2018 9:29 AM

## 2018-08-28 NOTE — Assessment & Plan Note (Signed)
Unfortunately, her disease continue to progress She has worsening leukocytosis even though her infection has improved/resolved I recommend hydroxyurea in an attempt to control her leukemia process  The risks, benefits, side effects of hydroxyurea are discussed with the patient and she is willing to proceed It is likely that she will continue the trajectory of worsening pancytopenia in the future with worsening disease control

## 2018-08-28 NOTE — Assessment & Plan Note (Signed)
This is due to leukemia We will proceed with 1 unit of blood to keep hemoglobin greater than 8 I anticipate she will need more transfusion support including the possibility of platelet transfusion in the future I will try to extend her transfusion appointments in the future

## 2018-08-29 LAB — TYPE AND SCREEN
ABO/RH(D): A POS
Antibody Screen: NEGATIVE
Unit division: 0

## 2018-08-29 LAB — BPAM RBC
Blood Product Expiration Date: 202008202359
ISSUE DATE / TIME: 202007291006
Unit Type and Rh: 6200

## 2018-09-03 ENCOUNTER — Other Ambulatory Visit: Payer: Self-pay | Admitting: Hematology and Oncology

## 2018-09-03 ENCOUNTER — Telehealth: Payer: Self-pay | Admitting: *Deleted

## 2018-09-03 NOTE — Telephone Encounter (Signed)
[facility] faxed Prior authorization request for Prednisone 10 mg tablets.  Request to [facility] letter tray receptacle of Prior Authorization forms for review.

## 2018-09-04 ENCOUNTER — Other Ambulatory Visit: Payer: Self-pay | Admitting: Hematology and Oncology

## 2018-09-04 ENCOUNTER — Encounter: Payer: Self-pay | Admitting: Hematology and Oncology

## 2018-09-04 ENCOUNTER — Other Ambulatory Visit: Payer: Self-pay

## 2018-09-04 ENCOUNTER — Inpatient Hospital Stay: Payer: Medicare Other

## 2018-09-04 ENCOUNTER — Inpatient Hospital Stay: Payer: Medicare Other | Attending: Hematology and Oncology

## 2018-09-04 ENCOUNTER — Telehealth: Payer: Self-pay | Admitting: *Deleted

## 2018-09-04 ENCOUNTER — Inpatient Hospital Stay (HOSPITAL_BASED_OUTPATIENT_CLINIC_OR_DEPARTMENT_OTHER): Payer: Medicare Other | Admitting: Hematology and Oncology

## 2018-09-04 ENCOUNTER — Other Ambulatory Visit: Payer: Self-pay | Admitting: *Deleted

## 2018-09-04 DIAGNOSIS — R600 Localized edema: Secondary | ICD-10-CM

## 2018-09-04 DIAGNOSIS — C9500 Acute leukemia of unspecified cell type not having achieved remission: Secondary | ICD-10-CM

## 2018-09-04 DIAGNOSIS — K1379 Other lesions of oral mucosa: Secondary | ICD-10-CM | POA: Diagnosis not present

## 2018-09-04 DIAGNOSIS — D61818 Other pancytopenia: Secondary | ICD-10-CM

## 2018-09-04 DIAGNOSIS — R509 Fever, unspecified: Secondary | ICD-10-CM | POA: Diagnosis not present

## 2018-09-04 DIAGNOSIS — J90 Pleural effusion, not elsewhere classified: Secondary | ICD-10-CM | POA: Diagnosis not present

## 2018-09-04 DIAGNOSIS — D709 Neutropenia, unspecified: Secondary | ICD-10-CM | POA: Diagnosis not present

## 2018-09-04 DIAGNOSIS — D539 Nutritional anemia, unspecified: Secondary | ICD-10-CM

## 2018-09-04 DIAGNOSIS — Z20828 Contact with and (suspected) exposure to other viral communicable diseases: Secondary | ICD-10-CM | POA: Diagnosis not present

## 2018-09-04 DIAGNOSIS — Z515 Encounter for palliative care: Secondary | ICD-10-CM | POA: Diagnosis not present

## 2018-09-04 DIAGNOSIS — J9601 Acute respiratory failure with hypoxia: Secondary | ICD-10-CM | POA: Diagnosis not present

## 2018-09-04 DIAGNOSIS — R531 Weakness: Secondary | ICD-10-CM | POA: Diagnosis not present

## 2018-09-04 DIAGNOSIS — D696 Thrombocytopenia, unspecified: Secondary | ICD-10-CM | POA: Diagnosis not present

## 2018-09-04 DIAGNOSIS — Z66 Do not resuscitate: Secondary | ICD-10-CM | POA: Diagnosis not present

## 2018-09-04 DIAGNOSIS — D693 Immune thrombocytopenic purpura: Secondary | ICD-10-CM

## 2018-09-04 DIAGNOSIS — R5383 Other fatigue: Secondary | ICD-10-CM | POA: Diagnosis not present

## 2018-09-04 DIAGNOSIS — R0602 Shortness of breath: Secondary | ICD-10-CM | POA: Diagnosis not present

## 2018-09-04 DIAGNOSIS — E790 Hyperuricemia without signs of inflammatory arthritis and tophaceous disease: Secondary | ICD-10-CM

## 2018-09-04 DIAGNOSIS — C9590 Leukemia, unspecified not having achieved remission: Secondary | ICD-10-CM | POA: Diagnosis not present

## 2018-09-04 DIAGNOSIS — A419 Sepsis, unspecified organism: Secondary | ICD-10-CM | POA: Diagnosis not present

## 2018-09-04 LAB — COMPREHENSIVE METABOLIC PANEL
ALT: 13 U/L (ref 0–44)
AST: 13 U/L — ABNORMAL LOW (ref 15–41)
Albumin: 3.8 g/dL (ref 3.5–5.0)
Alkaline Phosphatase: 32 U/L — ABNORMAL LOW (ref 38–126)
Anion gap: 11 (ref 5–15)
BUN: 23 mg/dL (ref 8–23)
CO2: 25 mmol/L (ref 22–32)
Calcium: 8.9 mg/dL (ref 8.9–10.3)
Chloride: 102 mmol/L (ref 98–111)
Creatinine, Ser: 1.02 mg/dL — ABNORMAL HIGH (ref 0.44–1.00)
GFR calc Af Amer: 59 mL/min — ABNORMAL LOW (ref >60)
GFR calc non Af Amer: 51 mL/min — ABNORMAL LOW (ref >60)
Glucose, Bld: 111 mg/dL — ABNORMAL HIGH (ref 70–99)
Potassium: 4.2 mmol/L (ref 3.5–5.1)
Sodium: 138 mmol/L (ref 135–145)
Total Bilirubin: 0.7 mg/dL (ref 0.3–1.2)
Total Protein: 6.6 g/dL (ref 6.5–8.1)

## 2018-09-04 LAB — CBC (CANCER CENTER ONLY)
HCT: 24.8 % — ABNORMAL LOW (ref 36.0–46.0)
Hemoglobin: 8.1 g/dL — ABNORMAL LOW (ref 12.0–15.0)
MCH: 30.9 pg (ref 26.0–34.0)
MCHC: 32.7 g/dL (ref 30.0–36.0)
MCV: 94.7 fL (ref 80.0–100.0)
Platelet Count: 9 10*3/uL — CL (ref 150–400)
RBC: 2.62 MIL/uL — ABNORMAL LOW (ref 3.87–5.11)
RDW: 19.9 % — ABNORMAL HIGH (ref 11.5–15.5)
WBC Count: 25.8 10*3/uL — ABNORMAL HIGH (ref 4.0–10.5)
nRBC: 0.3 % — ABNORMAL HIGH (ref 0.0–0.2)

## 2018-09-04 LAB — SAMPLE TO BLOOD BANK

## 2018-09-04 LAB — PREPARE RBC (CROSSMATCH)

## 2018-09-04 MED ORDER — HEPARIN SOD (PORK) LOCK FLUSH 100 UNIT/ML IV SOLN
500.0000 [IU] | Freq: Every day | INTRAVENOUS | Status: AC | PRN
  Administered 2018-09-04: 500 [IU]
  Filled 2018-09-04: qty 5

## 2018-09-04 MED ORDER — DIPHENHYDRAMINE HCL 25 MG PO CAPS
ORAL_CAPSULE | ORAL | Status: AC
  Filled 2018-09-04: qty 1

## 2018-09-04 MED ORDER — SODIUM CHLORIDE 0.9% FLUSH
10.0000 mL | INTRAVENOUS | Status: AC | PRN
  Administered 2018-09-04: 10 mL
  Filled 2018-09-04: qty 10

## 2018-09-04 MED ORDER — ACETAMINOPHEN 325 MG PO TABS
ORAL_TABLET | ORAL | Status: AC
  Filled 2018-09-04: qty 2

## 2018-09-04 MED ORDER — SODIUM CHLORIDE 0.9% FLUSH
10.0000 mL | Freq: Once | INTRAVENOUS | Status: AC
  Administered 2018-09-04: 10 mL
  Filled 2018-09-04: qty 10

## 2018-09-04 MED ORDER — ACETAMINOPHEN 325 MG PO TABS
650.0000 mg | ORAL_TABLET | Freq: Once | ORAL | Status: AC
  Administered 2018-09-04: 650 mg via ORAL

## 2018-09-04 MED ORDER — SODIUM CHLORIDE 0.9% IV SOLUTION
250.0000 mL | Freq: Once | INTRAVENOUS | Status: AC
  Administered 2018-09-04: 250 mL via INTRAVENOUS
  Filled 2018-09-04: qty 250

## 2018-09-04 MED ORDER — DIPHENHYDRAMINE HCL 25 MG PO CAPS
25.0000 mg | ORAL_CAPSULE | Freq: Once | ORAL | Status: AC
  Administered 2018-09-04: 25 mg via ORAL

## 2018-09-04 NOTE — Assessment & Plan Note (Signed)
She continues to have severe progressive pancytopenia Her white count has mildly improved with hydroxyurea She will continue hydroxyurea daily and weekly follow-up

## 2018-09-04 NOTE — Assessment & Plan Note (Signed)
She is noted to have diffuse petechiae rash Platelet count is down I am concerned about high risk of bleeding Hemoglobin is borderline low.  In fact, repeat CBC came back at 7.9 Given her age and recent congestive heart failure, I recommend blood transfusion along with platelet transfusion and she agreed to proceed She will get a unit of irradiated blood products as well as a unit of platelets today The patient is educated to watch out for signs and symptoms of bleeding I will see her next week for further supportive care and transfusion as needed

## 2018-09-04 NOTE — Patient Instructions (Signed)
Platelet Transfusion A platelet transfusion is a procedure in which you receive donated platelets through an IV. Platelets are tiny pieces of blood cells. When you get an injury, platelets clump together in the area to form a blood clot. This helps stop bleeding and is the beginning of the healing process. If you have too few platelets, your blood may have trouble clotting. This may cause you to bleed and bruise very easily. You may need a platelet transfusion if you have a condition that causes a low number of platelets (thrombocytopenia). A platelet transfusion may be used to stop or prevent excessive bleeding. Tell a health care provider about:  Any reactions you have had during previous transfusions.  Any allergies you have.  All medicines you are taking, including vitamins, herbs, eye drops, creams, and over-the-counter medicines.  Any blood disorders you have.  Any surgeries you have had.  Any medical conditions you have.  Whether you are pregnant or may be pregnant. What are the risks? Generally, this is a safe procedure. However, problems may occur, including:  Fever.  Infection.  Allergic reaction to the donor platelets.  Your body's disease-fighting system (immune system) attacking the donor platelets (hemolytic reaction). This is rare.  A rare reaction that causes lung damage (transfusion-related acute lung injury). What happens before the procedure? Medicines  Ask your health care provider about: ? Changing or stopping your regular medicines. This is especially important if you are taking diabetes medicines or blood thinners. ? Taking medicines such as aspirin and ibuprofen. These medicines can thin your blood. Do not take these medicines unless your health care provider tells you to take them. ? Taking over-the-counter medicines, vitamins, herbs, and supplements. General instructions  You will have a blood test to determine your blood type. Your blood type  determines what kind of platelets you will be given.  Follow instructions from your health care provider about eating or drinking restrictions.  If you have had an allergic reaction to a transfusion in the past, you may be given medicine to help prevent a reaction.  Your temperature, blood pressure, pulse, and breathing will be monitored. What happens during the procedure?   An IV will be inserted into one of your veins.  For your safety, two health care providers will verify your identity along with the donor platelets about to be infused.  A bag of donor platelets will be connected to your IV. The platelets will flow into your bloodstream. This usually takes 30-60 minutes.  Your temperature, blood pressure, pulse, and breathing will be monitored during the transfusion. This helps detect early signs of any reaction.  You will also be monitored for other symptoms that may indicate a reaction, including chills, hives, or itching.  If you have signs of a reaction at any time, your transfusion will be stopped, and you may be given medicine to help manage the reaction.  When your transfusion is complete, your IV will be removed.  Pressure may be applied to the IV site for a few minutes to stop any bleeding.  The IV site will be covered with a bandage (dressing). The procedure may vary among health care providers and hospitals. What happens after the procedure?  Your blood pressure, temperature, pulse, and breathing will be monitored until you leave the hospital or clinic.  You may have some bruising and soreness at your IV site. Follow these instructions at home: Medicines  Take over-the-counter and prescription medicines only as told by your health care provider.  Talk with your health care provider before you take any medicines that contain aspirin or NSAIDs. These medicines increase your risk for dangerous bleeding. General instructions  Change or remove your dressing as told  by your health care provider.  Return to your normal activities as told by your health care provider. Ask your health care provider what activities are safe for you.  Do not take baths, swim, or use a hot tub until your health care provider approves. Ask your health care provider if you may take showers.  Check your IV site every day for signs of infection. Check for: ? Redness, swelling, or pain. ? Fluid or blood. If fluid or blood drains from your IV site, use your hands to press down firmly on a bandage covering the area for a minute or two. Doing this should stop the bleeding. ? Warmth. ? Pus or a bad smell.  Keep all follow-up visits as told by your health care provider. This is important. Contact a health care provider if you have:  A headache that does not go away with medicine.  Hives, rash, or itchy skin.  Nausea or vomiting.  Unusual tiredness or weakness.  Signs of infection at your IV site. Get help right away if:  You have a fever or chills.  You urinate less often than usual.  Your urine is darker colored than normal.  You have any of the following: ? Trouble breathing. ? Pain in your back, abdomen, or chest. ? Cool, clammy skin. ? A fast heartbeat. Summary  Platelets are tiny pieces of blood cells that clump together to form a blood clot when you have an injury. If you have too few platelets, your blood may have trouble clotting.  A platelet transfusion is a procedure in which you receive donated platelets through an IV.  A platelet transfusion may be used to stop or prevent excessive bleeding.  After the procedure, check your IV site every day for signs of infection, including redness, swelling, pain, or warmth. This information is not intended to replace advice given to you by your health care provider. Make sure you discuss any questions you have with your health care provider. Document Released: 11/13/2006 Document Revised: 02/21/2017 Document  Reviewed: 02/21/2017 Elsevier Patient Education  2020 [employer].   Blood Transfusion, Adult  A blood transfusion is a procedure in which you receive donated blood, including plasma, platelets, and red blood cells, through an IV tube. You may need a blood transfusion because of illness, surgery, or injury. The blood may come from a donor. You may also be able to donate blood for yourself (autologous blood donation) before a surgery if you know that you might require a blood transfusion. The blood given in a transfusion is made up of different types of cells. You may receive:  Red blood cells. These carry oxygen to the cells in the body.  White blood cells. These help you fight infections.  Platelets. These help your blood to clot.  Plasma. This is the liquid part of your blood and it helps with fluid imbalances. If you have hemophilia or another clotting disorder, you may also receive other types of blood products. Tell a health care provider about:  Any allergies you have.  All medicines you are taking, including vitamins, herbs, eye drops, creams, and over-the-counter medicines.  Any problems you or family members have had with anesthetic medicines.  Any blood disorders you have.  Any surgeries you have had.  Any medical conditions  you have, including any recent fever or cold symptoms.  Whether you are pregnant or may be pregnant.  Any previous reactions you have had during a blood transfusion. What are the risks? Generally, this is a safe procedure. However, problems may occur, including:  Having an allergic reaction to something in the donated blood. Hives and itching may be symptoms of this type of reaction.  Fever. This may be a reaction to the white blood cells in the transfused blood. Nausea or chest pain may accompany a fever.  Iron overload. This can happen from having many transfusions.  Transfusion-related acute lung injury (TRALI). This is a rare reaction  that causes lung damage. The cause is not known.TRALI can occur within hours of a transfusion or several days later.  Sudden (acute) or delayed hemolytic reactions. This happens if your blood does not match the cells in your transfusion. Your body's defense system (immune system) may try to attack the new cells. This complication is rare. The symptoms include fever, chills, nausea, and low back pain or chest pain.  Infection or disease transmission. This is rare. What happens before the procedure?  You will have a blood test to determine your blood type. This is necessary to know what kind of blood your body will accept and to match it to the donor blood.  If you are going to have a planned surgery, you may be able to do an autologous blood donation. This may be done in case you need to have a transfusion.  If you have had an allergic reaction to a transfusion in the past, you may be given medicine to help prevent a reaction. This medicine may be given to you by mouth or through an IV tube.  You will have your temperature, blood pressure, and pulse monitored before the transfusion.  Follow instructions from your health care provider about eating and drinking restrictions.  Ask your health care provider about: ? Changing or stopping your regular medicines. This is especially important if you are taking diabetes medicines or blood thinners. ? Taking medicines such as aspirin and ibuprofen. These medicines can thin your blood. Do not take these medicines before your procedure if your health care provider instructs you not to. What happens during the procedure?  An IV tube will be inserted into one of your veins.  The bag of donated blood will be attached to your IV tube. The blood will then enter through your vein.  Your temperature, blood pressure, and pulse will be monitored regularly during the transfusion. This monitoring is done to detect early signs of a transfusion reaction.  If you  have any signs or symptoms of a reaction, your transfusion will be stopped and you may be given medicine.  When the transfusion is complete, your IV tube will be removed.  Pressure may be applied to the IV site for a few minutes.  A bandage (dressing) will be applied. The procedure may vary among health care providers and hospitals. What happens after the procedure?  Your temperature, blood pressure, heart rate, breathing rate, and blood oxygen level will be monitored often.  Your blood may be tested to see how you are responding to the transfusion.  You may be warmed with fluids or blankets to maintain a normal body temperature. Summary  A blood transfusion is a procedure in which you receive donated blood, including plasma, platelets, and red blood cells, through an IV tube.  Your temperature, blood pressure, and pulse will be monitored before,  during, and after the transfusion.  Your blood may be tested after the transfusion to see how your body has responded. This information is not intended to replace advice given to you by your health care provider. Make sure you discuss any questions you have with your health care provider. Document Released: 01/14/2000 Document Revised: 12/03/2017 Document Reviewed: 10/14/2015 Elsevier Patient Education  2020 [employer].

## 2018-09-04 NOTE — Progress Notes (Signed)
[facility] OFFICE PROGRESS NOTE  Patient Care Team: [name], MD as PCP - General (Family Medicine)  ASSESSMENT & PLAN:  Acute leukemia [facility]) She continues to have severe progressive pancytopenia Her white count has mildly improved with hydroxyurea She will continue hydroxyurea daily and weekly follow-up  Pancytopenia, acquired ([place]) She is noted to have diffuse petechiae rash Platelet count is down I am concerned about high risk of bleeding Hemoglobin is borderline low.  In fact, repeat CBC came back at 7.9 Given her age and recent congestive heart failure, I recommend blood transfusion along with platelet transfusion and she agreed to proceed She will get a unit of irradiated blood products as well as a unit of platelets today The patient is educated to watch out for signs and symptoms of bleeding I will see her next week for further supportive care and transfusion as needed  Bilateral leg edema She will continue diuretic therapy with furosemide and spironolactone Her renal function is stable  Oral pain She has oral pain, could be due to mucositis I recommend scheduled Tylenol The patient has morphine sulfate to take as needed If her mucositis does not improve, I will consider another round of antibiotics I will reassess next week   No orders of the defined types were placed in this encounter.   INTERVAL HISTORY: Please see below for problem oriented charting. She returns for her weekly follow-up She continues to have intermittent oral pain but it does not affect her swallowing or eating Her appetite is stable Denies recent fever or chills She has completed her course of antibiotics treatment She noted increased bruising but denies signs of active bleeding such as epistaxis, hematuria or hematochezia  SUMMARY OF ONCOLOGIC HISTORY: Oncology History  Acute leukemia ([place])  07/24/2018 Bone Marrow Biopsy   Bone Marrow, Aspirate,Biopsy, and Clot -  HYPERCELLULAR BONE MARROW WITH ACUTE LEUKEMIA - SEE COMMENT PERIPHERAL BLOOD: - PANCYTOPENIA WITH CIRCULATING BLASTS Diagnosis Note The bone marrow is significantly involved by an acute leukemic process. 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 Correlation with cytogenetic and molecular studies is strongly recommended.   07/26/2018 Initial Diagnosis   Acute leukemia ([facility])     REVIEW OF SYSTEMS:   Constitutional: Denies fevers, chills or abnormal weight loss Eyes: Denies blurriness of vision Respiratory: Denies cough, dyspnea or wheezes Cardiovascular: Denies palpitation, chest discomfort Gastrointestinal:  Denies nausea, heartburn or change in bowel habits Skin: Denies abnormal skin rashes Lymphatics: Denies new lymphadenopathy  Neurological:Denies numbness, tingling or new weaknesses Behavioral/Psych: Mood is stable, no new changes  All other systems were reviewed with the patient and are negative.  I have reviewed the past medical history, past surgical history, social history and family history with the patient and they are unchanged from previous note.  ALLERGIES:  is allergic to pine; celebrex [celecoxib]; and zyrtec [cetirizine].  MEDICATIONS:  Current Outpatient Medications  Medication Sig Dispense Refill  . Cholecalciferol (VITAMIN D) 50 MCG (2000 UT) tablet Take 2,000 Units by mouth daily.    Marland Kitchen escitalopram (LEXAPRO) 10 MG tablet Take 10 mg by mouth daily.  3  . furosemide (LASIX) 40 MG tablet TAKE 1 TABLET BY MOUTH EVERY DAY 30 tablet 1  . hydroxyurea (HYDREA) 500 MG capsule Take 1 capsule (500 mg total) by mouth daily. May take with food to minimize GI side effects. 30 capsule 11  .  levothyroxine (SYNTHROID, LEVOTHROID) 100 MCG tablet Take 100 mcg by mouth daily before breakfast.    . Morphine Sulfate (MORPHINE CONCENTRATE)  10 MG/0.5ML SOLN concentrated solution Take 0.5 mLs (10 mg total) by mouth every 2 (two) hours as needed. 120 mL 0  . Multiple Vitamins-Minerals (MULTIVITAMIN GUMMIES ADULT) CHEW Chew 1 tablet by mouth at bedtime.    . predniSONE (DELTASONE) 10 MG tablet Take 2 tablets (20 mg total) by mouth daily with breakfast. 60 tablet 1  . spironolactone (ALDACTONE) 25 MG tablet TAKE 1 TABLET BY MOUTH EVERY DAY 30 tablet 1   No current facility-administered medications for this visit.     PHYSICAL EXAMINATION: ECOG PERFORMANCE STATUS: 2 - Symptomatic, <50% confined to bed  Vitals:   09/04/18 1033  BP: (!) 136/53  Pulse: 80  Resp: 18  Temp: 98.5 F (36.9 C)  SpO2: 97%   Filed Weights   09/04/18 1033  Weight: 164 lb 12.8 oz (74.8 kg)    GENERAL:alert, no distress and comfortable SKIN: Noted diffuse petechiae rash EYES: normal, Conjunctiva are pink and non-injected, sclera clear OROPHARYNX:no exudate, no erythema and lips, buccal mucosa, and tongue normal  NECK: supple, thyroid normal size, non-tender, without nodularity LYMPH:  no palpable lymphadenopathy in the cervical, axillary or inguinal LUNGS: clear to auscultation and percussion with normal breathing effort HEART: regular rate & rhythm and no murmurs with moderate bilateral lower extremity edema ABDOMEN:abdomen soft, non-tender and normal bowel sounds Musculoskeletal:no cyanosis of digits and no clubbing  NEURO: alert & oriented x 3 with fluent speech, no focal motor/sensory deficits  LABORATORY DATA:  I have reviewed the data as listed    Component Value Date/Time   NA 138 09/04/2018 0945   K 4.2 09/04/2018 0945   CL 102 09/04/2018 0945   CO2 25 09/04/2018 0945   GLUCOSE 111 (H) 09/04/2018 0945   BUN 23 09/04/2018 0945   CREATININE 1.02 (H) 09/04/2018 0945   CREATININE 0.90 06/04/2018 1022   CALCIUM 8.9 09/04/2018 0945   PROT 6.6 09/04/2018 0945   ALBUMIN 3.8 09/04/2018 0945   AST 13 (L) 09/04/2018 0945   AST 28  06/04/2018 1022   ALT 13 09/04/2018 0945   ALT 40 06/04/2018 1022   ALKPHOS 32 (L) 09/04/2018 0945   BILITOT 0.7 09/04/2018 0945   BILITOT 1.3 (H) 06/04/2018 1022   GFRNONAA 51 (L) 09/04/2018 0945   GFRNONAA 60 (L) 06/04/2018 1022   GFRAA 59 (L) 09/04/2018 0945   GFRAA >60 06/04/2018 1022    No results found for: SPEP, UPEP  Lab Results  Component Value Date   WBC 25.8 (H) 09/04/2018   NEUTROABS 0.0 (LL) 08/28/2018   HGB 8.1 (L) 09/04/2018   HCT 24.8 (L) 09/04/2018   MCV 94.7 09/04/2018   PLT 9 (LL) 09/04/2018      Chemistry      Component Value Date/Time   NA 138 09/04/2018 0945   K 4.2 09/04/2018 0945   CL 102 09/04/2018 0945   CO2 25 09/04/2018 0945   BUN 23 09/04/2018 0945   CREATININE 1.02 (H) 09/04/2018 0945   CREATININE 0.90 06/04/2018 1022      Component Value Date/Time   CALCIUM 8.9 09/04/2018 0945   ALKPHOS 32 (L) 09/04/2018 0945   AST 13 (L) 09/04/2018 0945   AST 28 06/04/2018 1022   ALT 13 09/04/2018 0945   ALT 40 06/04/2018 1022   BILITOT 0.7 09/04/2018 0945   BILITOT 1.3 (H) 06/04/2018 1022  RADIOGRAPHIC STUDIES: I have personally reviewed the radiological images as listed and agreed with the findings in the report. Ir Picc Placement Right >5 Yrs Inc Img Guide  Result Date: 08/06/2018 INDICATION: 85 year old female referred for PICC placement EXAM: PICC LINE PLACEMENT WITH ULTRASOUND AND FLUOROSCOPIC GUIDANCE MEDICATIONS: None ANESTHESIA/SEDATION: None. FLUOROSCOPY TIME:  Fluoroscopy Time: 0 minutes 6 seconds (0.7 mGy). COMPLICATIONS: None PROCEDURE: Informed written consent was obtained from the patient after a thorough discussion of the procedural risks, benefits and alternatives. All questions were addressed. Maximal Sterile Barrier Technique was utilized including caps, mask, sterile gowns, sterile gloves, sterile drape, hand hygiene and skin antiseptic. A timeout was performed prior to the initiation of the procedure. Patient was position in  the supine position on the fluoroscopy table with the right arm abducted 90 degrees. Ultrasound survey of the upper extremity was performed with images stored and sent to PACs. The right brachial vein was selected for access. Once the patient was prepped and draped in the usual sterile fashion, the skin and subcutaneous tissues were generously infiltrated with 1% lidocaine for local anesthesia. A micropuncture access kit was then used to access the targeted vein. Wire was passed centrally, confirmed to be within the venous system under fluoroscopy. A small stab incision was made with an 11 blade scalpel and the sheath was then placed over the wire. Estimated length of the catheter was then performed with the indwelling wire. Catheter was amputated at 40 cm length and placed with coaxial wire through the peel-away. Double lumen, power injectable PICC in the brachial vein. Tip confirmed at the cavoatrial junction, and the catheter is ready for use. Stat lock was placed. Patient tolerated the procedure well and remained hemodynamically stable throughout. No complications were encountered and no significant blood loss was encountered. IMPRESSION: Status post right upper extremity double lumen power PICC. Catheter ready for use. Signed, Dulcy Fanny. Dellia Nims, RPVI [facility] Radiology Electronically Signed   By: Corrie Mckusick D.O.   On: 08/06/2018 09:47    All questions were answered. The patient knows to call the clinic with any problems, questions or concerns. No barriers to learning was detected.  I spent 25 minutes counseling the patient face to face. The total time spent in the appointment was 30 minutes and more than 50% was on counseling and review of test results  Heath Lark, MD 09/04/2018 11:03 AM

## 2018-09-04 NOTE — Telephone Encounter (Signed)
Received call from our lab with a panic plt count of 9.  Informed desk RN.

## 2018-09-04 NOTE — Assessment & Plan Note (Signed)
She has oral pain, could be due to mucositis I recommend scheduled Tylenol The patient has morphine sulfate to take as needed If her mucositis does not improve, I will consider another round of antibiotics I will reassess next week

## 2018-09-04 NOTE — Assessment & Plan Note (Signed)
She will continue diuretic therapy with furosemide and spironolactone Her renal function is stable

## 2018-09-05 LAB — TYPE AND SCREEN
ABO/RH(D): A POS
Antibody Screen: NEGATIVE
Unit division: 0

## 2018-09-05 LAB — BPAM PLATELET PHERESIS
Blood Product Expiration Date: 202008062359
ISSUE DATE / TIME: 202008051220
Unit Type and Rh: 9500

## 2018-09-05 LAB — PREPARE PLATELET PHERESIS: Unit division: 0

## 2018-09-05 LAB — BPAM RBC
Blood Product Expiration Date: 202008172359
ISSUE DATE / TIME: 202008051218
Unit Type and Rh: 6200

## 2018-09-06 ENCOUNTER — Emergency Department (HOSPITAL_COMMUNITY): Payer: Medicare Other

## 2018-09-06 ENCOUNTER — Other Ambulatory Visit: Payer: Self-pay

## 2018-09-06 ENCOUNTER — Inpatient Hospital Stay (HOSPITAL_COMMUNITY)
Admission: EM | Admit: 2018-09-06 | Discharge: 2018-10-01 | DRG: 871 | Disposition: E | Payer: Medicare Other | Attending: Internal Medicine | Admitting: Internal Medicine

## 2018-09-06 ENCOUNTER — Telehealth: Payer: Self-pay | Admitting: *Deleted

## 2018-09-06 DIAGNOSIS — C9590 Leukemia, unspecified not having achieved remission: Secondary | ICD-10-CM | POA: Diagnosis not present

## 2018-09-06 DIAGNOSIS — Z79891 Long term (current) use of opiate analgesic: Secondary | ICD-10-CM | POA: Diagnosis not present

## 2018-09-06 DIAGNOSIS — K625 Hemorrhage of anus and rectum: Secondary | ICD-10-CM | POA: Diagnosis present

## 2018-09-06 DIAGNOSIS — D696 Thrombocytopenia, unspecified: Secondary | ICD-10-CM | POA: Diagnosis not present

## 2018-09-06 DIAGNOSIS — D693 Immune thrombocytopenic purpura: Secondary | ICD-10-CM | POA: Diagnosis present

## 2018-09-06 DIAGNOSIS — R5081 Fever presenting with conditions classified elsewhere: Secondary | ICD-10-CM | POA: Diagnosis present

## 2018-09-06 DIAGNOSIS — Z20828 Contact with and (suspected) exposure to other viral communicable diseases: Secondary | ICD-10-CM | POA: Diagnosis not present

## 2018-09-06 DIAGNOSIS — E039 Hypothyroidism, unspecified: Secondary | ICD-10-CM | POA: Diagnosis present

## 2018-09-06 DIAGNOSIS — F329 Major depressive disorder, single episode, unspecified: Secondary | ICD-10-CM | POA: Diagnosis present

## 2018-09-06 DIAGNOSIS — C9500 Acute leukemia of unspecified cell type not having achieved remission: Secondary | ICD-10-CM | POA: Diagnosis not present

## 2018-09-06 DIAGNOSIS — J90 Pleural effusion, not elsewhere classified: Secondary | ICD-10-CM | POA: Diagnosis not present

## 2018-09-06 DIAGNOSIS — I110 Hypertensive heart disease with heart failure: Secondary | ICD-10-CM | POA: Diagnosis present

## 2018-09-06 DIAGNOSIS — X58XXXA Exposure to other specified factors, initial encounter: Secondary | ICD-10-CM | POA: Diagnosis present

## 2018-09-06 DIAGNOSIS — R531 Weakness: Secondary | ICD-10-CM

## 2018-09-06 DIAGNOSIS — Z7952 Long term (current) use of systemic steroids: Secondary | ICD-10-CM

## 2018-09-06 DIAGNOSIS — Z888 Allergy status to other drugs, medicaments and biological substances status: Secondary | ICD-10-CM

## 2018-09-06 DIAGNOSIS — R5383 Other fatigue: Secondary | ICD-10-CM | POA: Diagnosis not present

## 2018-09-06 DIAGNOSIS — Z515 Encounter for palliative care: Secondary | ICD-10-CM | POA: Diagnosis present

## 2018-09-06 DIAGNOSIS — Z79890 Hormone replacement therapy (postmenopausal): Secondary | ICD-10-CM | POA: Diagnosis not present

## 2018-09-06 DIAGNOSIS — R0689 Other abnormalities of breathing: Secondary | ICD-10-CM | POA: Diagnosis not present

## 2018-09-06 DIAGNOSIS — D630 Anemia in neoplastic disease: Secondary | ICD-10-CM | POA: Diagnosis present

## 2018-09-06 DIAGNOSIS — J9601 Acute respiratory failure with hypoxia: Secondary | ICD-10-CM | POA: Diagnosis present

## 2018-09-06 DIAGNOSIS — A419 Sepsis, unspecified organism: Principal | ICD-10-CM | POA: Diagnosis present

## 2018-09-06 DIAGNOSIS — Z66 Do not resuscitate: Secondary | ICD-10-CM | POA: Diagnosis present

## 2018-09-06 DIAGNOSIS — I10 Essential (primary) hypertension: Secondary | ICD-10-CM | POA: Diagnosis present

## 2018-09-06 DIAGNOSIS — Q929 Trisomy and partial trisomy of autosomes, unspecified: Secondary | ICD-10-CM

## 2018-09-06 DIAGNOSIS — Z91018 Allergy to other foods: Secondary | ICD-10-CM

## 2018-09-06 DIAGNOSIS — I5032 Chronic diastolic (congestive) heart failure: Secondary | ICD-10-CM | POA: Diagnosis present

## 2018-09-06 DIAGNOSIS — N179 Acute kidney failure, unspecified: Secondary | ICD-10-CM | POA: Diagnosis present

## 2018-09-06 DIAGNOSIS — Z90710 Acquired absence of both cervix and uterus: Secondary | ICD-10-CM | POA: Diagnosis not present

## 2018-09-06 DIAGNOSIS — T380X5A Adverse effect of glucocorticoids and synthetic analogues, initial encounter: Secondary | ICD-10-CM | POA: Diagnosis present

## 2018-09-06 DIAGNOSIS — Z9049 Acquired absence of other specified parts of digestive tract: Secondary | ICD-10-CM | POA: Diagnosis not present

## 2018-09-06 DIAGNOSIS — Z79899 Other long term (current) drug therapy: Secondary | ICD-10-CM

## 2018-09-06 DIAGNOSIS — R739 Hyperglycemia, unspecified: Secondary | ICD-10-CM

## 2018-09-06 DIAGNOSIS — R509 Fever, unspecified: Secondary | ICD-10-CM

## 2018-09-06 DIAGNOSIS — R112 Nausea with vomiting, unspecified: Secondary | ICD-10-CM | POA: Diagnosis not present

## 2018-09-06 DIAGNOSIS — R000 Tachycardia, unspecified: Secondary | ICD-10-CM | POA: Diagnosis not present

## 2018-09-06 DIAGNOSIS — D709 Neutropenia, unspecified: Secondary | ICD-10-CM | POA: Diagnosis not present

## 2018-09-06 DIAGNOSIS — R0602 Shortness of breath: Secondary | ICD-10-CM | POA: Diagnosis not present

## 2018-09-06 DIAGNOSIS — Z800 Family history of malignant neoplasm of digestive organs: Secondary | ICD-10-CM

## 2018-09-06 DIAGNOSIS — R0902 Hypoxemia: Secondary | ICD-10-CM | POA: Diagnosis not present

## 2018-09-06 LAB — URINALYSIS, ROUTINE W REFLEX MICROSCOPIC
Bacteria, UA: NONE SEEN
Bilirubin Urine: NEGATIVE
Glucose, UA: NEGATIVE mg/dL
Ketones, ur: NEGATIVE mg/dL
Leukocytes,Ua: NEGATIVE
Nitrite: NEGATIVE
Protein, ur: NEGATIVE mg/dL
Specific Gravity, Urine: 1.011 (ref 1.005–1.030)
pH: 5 (ref 5.0–8.0)

## 2018-09-06 LAB — COMPREHENSIVE METABOLIC PANEL
ALT: 18 U/L (ref 0–44)
AST: 22 U/L (ref 15–41)
Albumin: 3.8 g/dL (ref 3.5–5.0)
Alkaline Phosphatase: 30 U/L — ABNORMAL LOW (ref 38–126)
Anion gap: 15 (ref 5–15)
BUN: 26 mg/dL — ABNORMAL HIGH (ref 8–23)
CO2: 24 mmol/L (ref 22–32)
Calcium: 9.2 mg/dL (ref 8.9–10.3)
Chloride: 98 mmol/L (ref 98–111)
Creatinine, Ser: 1.21 mg/dL — ABNORMAL HIGH (ref 0.44–1.00)
GFR calc Af Amer: 48 mL/min — ABNORMAL LOW (ref >60)
GFR calc non Af Amer: 41 mL/min — ABNORMAL LOW (ref >60)
Glucose, Bld: 225 mg/dL — ABNORMAL HIGH (ref 70–99)
Potassium: 4.1 mmol/L (ref 3.5–5.1)
Sodium: 137 mmol/L (ref 135–145)
Total Bilirubin: 1.1 mg/dL (ref 0.3–1.2)
Total Protein: 6.8 g/dL (ref 6.5–8.1)

## 2018-09-06 LAB — CBC WITH DIFFERENTIAL/PLATELET
Band Neutrophils: 0 %
Basophils Absolute: 0 10*3/uL (ref 0.0–0.1)
Basophils Relative: 0 %
Blasts: 78 %
Eosinophils Absolute: 0 10*3/uL (ref 0.0–0.5)
Eosinophils Relative: 0 %
HCT: 28.4 % — ABNORMAL LOW (ref 36.0–46.0)
Hemoglobin: 9.1 g/dL — ABNORMAL LOW (ref 12.0–15.0)
Lymphocytes Relative: 20 %
Lymphs Abs: 2 10*3/uL (ref 0.7–4.0)
MCH: 30.4 pg (ref 26.0–34.0)
MCHC: 32 g/dL (ref 30.0–36.0)
MCV: 95 fL (ref 80.0–100.0)
Metamyelocytes Relative: 0 %
Monocytes Absolute: 0.2 10*3/uL (ref 0.1–1.0)
Monocytes Relative: 2 %
Myelocytes: 0 %
Neutro Abs: 0 10*3/uL — ABNORMAL LOW (ref 1.7–7.7)
Neutrophils Relative %: 0 %
Other: 0 %
Platelets: 6 10*3/uL — CL (ref 150–400)
Promyelocytes Relative: 0 %
RBC: 2.99 MIL/uL — ABNORMAL LOW (ref 3.87–5.11)
RDW: 18.8 % — ABNORMAL HIGH (ref 11.5–15.5)
WBC: 9.9 10*3/uL (ref 4.0–10.5)
nRBC: 0.6 % — ABNORMAL HIGH (ref 0.0–0.2)
nRBC: 1 /100 WBC — ABNORMAL HIGH

## 2018-09-06 LAB — TYPE AND SCREEN
ABO/RH(D): A POS
Antibody Screen: NEGATIVE

## 2018-09-06 LAB — PROTIME-INR
INR: 1.1 (ref 0.8–1.2)
Prothrombin Time: 14.3 seconds (ref 11.4–15.2)

## 2018-09-06 LAB — SARS CORONAVIRUS 2 BY RT PCR (HOSPITAL ORDER, PERFORMED IN ~~LOC~~ HOSPITAL LAB): SARS Coronavirus 2: NEGATIVE

## 2018-09-06 LAB — LACTIC ACID, PLASMA
Lactic Acid, Venous: 4.2 mmol/L (ref 0.5–1.9)
Lactic Acid, Venous: 3.9 mmol/L (ref 0.5–1.9)

## 2018-09-06 LAB — APTT: aPTT: 29 seconds (ref 24–36)

## 2018-09-06 MED ORDER — MEPERIDINE HCL 25 MG/ML IJ SOLN
12.5000 mg | INTRAMUSCULAR | Status: DC | PRN
  Filled 2018-09-06: qty 1

## 2018-09-06 MED ORDER — ACETAMINOPHEN 650 MG RE SUPP: 650.0000 mg | Freq: Four times a day (QID) | RECTAL | Status: DC | PRN

## 2018-09-06 MED ORDER — MORPHINE SULFATE (CONCENTRATE) 10 MG/0.5ML PO SOLN: 10.0000 mg | ORAL | Status: DC | PRN

## 2018-09-06 MED ORDER — SODIUM CHLORIDE 0.9% FLUSH: 3.0000 mL | INTRAVENOUS | Status: DC | PRN

## 2018-09-06 MED ORDER — PREDNISONE 20 MG PO TABS: 20.0000 mg | ORAL_TABLET | Freq: Every day | ORAL | Status: DC

## 2018-09-06 MED ORDER — SODIUM CHLORIDE 0.9 % IV BOLUS: 500.0000 mL | Freq: Once | INTRAVENOUS | Status: DC | PRN

## 2018-09-06 MED ORDER — SODIUM CHLORIDE 0.9 % IV SOLN
INTRAVENOUS | Status: DC
  Administered 2018-09-06 – 2018-09-07 (×2): via INTRAVENOUS

## 2018-09-06 MED ORDER — ACETAMINOPHEN 160 MG/5ML PO SOLN: 325.0000 mg | Freq: Four times a day (QID) | ORAL | Status: DC | PRN

## 2018-09-06 MED ORDER — LORAZEPAM 2 MG/ML IJ SOLN: 0.5000 mg | Freq: Four times a day (QID) | INTRAMUSCULAR | Status: DC | PRN

## 2018-09-06 MED ORDER — ACETAMINOPHEN 325 MG PO TABS
650.0000 mg | ORAL_TABLET | Freq: Once | ORAL | Status: DC
  Filled 2018-09-06: qty 2

## 2018-09-06 MED ORDER — ONDANSETRON HCL 4 MG PO TABS: 4.0000 mg | ORAL_TABLET | Freq: Four times a day (QID) | ORAL | Status: DC | PRN

## 2018-09-06 MED ORDER — SODIUM CHLORIDE 0.9 % IV SOLN
2.0000 g | Freq: Once | INTRAVENOUS | Status: AC
  Administered 2018-09-06: 2 g via INTRAVENOUS
  Filled 2018-09-06: qty 2

## 2018-09-06 MED ORDER — SODIUM CHLORIDE 0.9 % IV BOLUS
1000.0000 mL | Freq: Once | INTRAVENOUS | Status: AC
  Administered 2018-09-06: 17:00:00 1000 mL via INTRAVENOUS

## 2018-09-06 MED ORDER — SODIUM CHLORIDE 0.9 % IV SOLN
2.0000 g | Freq: Three times a day (TID) | INTRAVENOUS | Status: DC
  Administered 2018-09-07: 2 g via INTRAVENOUS
  Filled 2018-09-06 (×2): qty 2

## 2018-09-06 MED ORDER — ONDANSETRON HCL 4 MG/2ML IJ SOLN: 4.0000 mg | Freq: Four times a day (QID) | INTRAMUSCULAR | Status: DC | PRN

## 2018-09-06 MED ORDER — SODIUM CHLORIDE 0.9% FLUSH: 3.0000 mL | Freq: Two times a day (BID) | INTRAVENOUS | Status: DC

## 2018-09-06 MED ORDER — ACETAMINOPHEN 325 MG PO TABS: 650.0000 mg | ORAL_TABLET | Freq: Four times a day (QID) | ORAL | Status: DC | PRN

## 2018-09-06 MED ORDER — ACETAMINOPHEN 650 MG RE SUPP: 650.0000 mg | Freq: Once | RECTAL | Status: DC

## 2018-09-06 MED ORDER — VANCOMYCIN HCL IN DEXTROSE 1-5 GM/200ML-% IV SOLN
1000.0000 mg | Freq: Once | INTRAVENOUS | Status: AC
  Administered 2018-09-06: 19:00:00 1000 mg via INTRAVENOUS
  Filled 2018-09-06: qty 200

## 2018-09-06 MED ORDER — SODIUM CHLORIDE 0.9 % IV BOLUS
1000.0000 mL | Freq: Once | INTRAVENOUS | Status: AC
  Administered 2018-09-07: 1000 mL via INTRAVENOUS

## 2018-09-06 MED ORDER — MORPHINE SULFATE (PF) 2 MG/ML IV SOLN: 2.0000 mg | INTRAVENOUS | Status: DC | PRN

## 2018-09-06 MED ORDER — SODIUM CHLORIDE 0.9 % IV SOLN: 250.0000 mL | INTRAVENOUS | Status: DC | PRN

## 2018-09-06 MED ORDER — LEVOTHYROXINE SODIUM 100 MCG PO TABS: 100.0000 ug | ORAL_TABLET | Freq: Every day | ORAL | Status: DC

## 2018-09-06 NOTE — ED Notes (Signed)
Pt more irritable on approach.

## 2018-09-06 NOTE — Progress Notes (Signed)
A consult was received from an ED physician for vanc and cefepime per pharmacy dosing.  The patient's profile has been reviewed for ht/wt/allergies/indication/available labs.   A one time order has been placed for cefepime 2gm and vanc 1 gm  Further antibiotics/pharmacy consults should be ordered by admitting physician if indicated.                       Thank you, Dolly Rias RPh 09/27/2018, 4:35 PM Pager [phone]

## 2018-09-06 NOTE — ED Notes (Signed)
EDP at bedside  

## 2018-09-06 NOTE — ED Notes (Signed)
Date and time results received: 09/05/2018 1738 (use smartphrase ".now" to insert current time)  Test: Lactic Acid Critical Value: 4.2  Name of Provider Notified: Dr. Roslynn Amble  Orders Received? Or Actions Taken?:

## 2018-09-06 NOTE — ED Notes (Signed)
ED TO INPATIENT HANDOFF REPORT  Name/Age/Gender Claudia Moore 85 y.o. female  Code Status    Code Status Orders  (From admission, onward)         Start     Ordered   09/29/2018 1852  Do not attempt resuscitation (DNR)  Continuous    Question Answer Comment  In the event of cardiac or respiratory ARREST Do not call a "code blue"   In the event of cardiac or respiratory ARREST Do not perform Intubation, CPR, defibrillation or ACLS   In the event of cardiac or respiratory ARREST Use medication by any route, position, wound care, and other measures to relive pain and suffering. May use oxygen, suction and manual treatment of airway obstruction as needed for comfort.      09/24/2018 1852        Code Status History    Date Active Date Inactive Code Status Order ID Comments User Context   02/09/2018 0003 02/11/2018 1857 DNR [id number]  Gwynne Edinger, MD Inpatient   Advance Care Planning Activity    Advance Directive Documentation     Most Recent Value  Type of Advance Directive  Living will  Pre-existing out of facility DNR order (yellow form or pink MOST form)  -  "MOST" Form in Place?  -      Home/SNF/Other Home  Chief Complaint Fever; Weakness  Level of Care/Admitting Diagnosis ED Disposition    ED Disposition Condition [place]: [place] [100102]  Level of Care: Med-Surg [16]  Covid Evaluation: Confirmed COVID Negative  Diagnosis: Neutropenic fever ([place]) [[id number]]  Admitting Physician: Toy Baker [[id number]]  Attending Physician: Toy Baker [[id number]]  Estimated length of stay: 3 - 4 days  Certification:: I certify this patient will need inpatient services for at least 2 midnights  PT Class (Do Not Modify): Inpatient [101]  PT Acc Code (Do Not Modify): Private [1]       Medical History Past Medical History:  Diagnosis Date  . Bilateral leg edema   . Chronic ITP (idiopathic thrombocytopenia) (HCC)   .  Chronic leukopenia   . Depression   . Hypertension   . Thyroid disease     Allergies Allergies  Allergen Reactions  . Pine Anaphylaxis    PIne Nuts  . Celebrex [Celecoxib] Other (See Comments)    Increase Heart Rate, Sweats and Nausea  . Zyrtec [Cetirizine] Other (See Comments)    Rapid heart rate and dizziness    IV Location/Drains/Wounds Patient Lines/Drains/Airways Status   Active Line/Drains/Airways    Name:   Placement date:   Placement time:   Site:   Days:   Peripheral IV 09/02/2018 Left Antecubital   08/31/2018    1640    Antecubital   less than 1   PICC Double Lumen 08/06/18 PICC Right Brachial 40 cm   08/06/18    0848     31          Labs/Imaging Results for orders placed or performed during the hospital encounter of 09/15/2018 (from the past 48 hour(s))  Urinalysis, Routine w reflex microscopic     Status: Abnormal   Collection Time: 09/15/2018  4:33 PM  Result Value Ref Range   Color, Urine STRAW (A) YELLOW   APPearance CLEAR CLEAR   Specific Gravity, Urine 1.011 1.005 - 1.030   pH 5.0 5.0 - 8.0   Glucose, UA NEGATIVE NEGATIVE mg/dL   Hgb urine dipstick SMALL (A) NEGATIVE  Bilirubin Urine NEGATIVE NEGATIVE   Ketones, ur NEGATIVE NEGATIVE mg/dL   Protein, ur NEGATIVE NEGATIVE mg/dL   Nitrite NEGATIVE NEGATIVE   Leukocytes,Ua NEGATIVE NEGATIVE   RBC / HPF 0-5 0 - 5 RBC/hpf   Bacteria, UA NONE SEEN NONE SEEN    Comment: Performed at [facility], [place] [address], [place], [place] [address]  Lactic acid, plasma     Status: Abnormal   Collection Time: 09/17/2018  4:36 PM  Result Value Ref Range   Lactic Acid, Venous 4.2 (HH) 0.5 - 1.9 mmol/L    Comment: CRITICAL RESULT CALLED TO, READ BACK BY AND VERIFIED WITH: TIM @ 1739 ON 09/12/2018 C VARNER  Performed at [facility], [place] [address] [place], [place] [address]   CBC with Differential     Status: Abnormal   Collection Time: 09/21/2018  4:36 PM  Result Value Ref Range   WBC  9.9 4.0 - 10.5 K/uL   RBC 2.99 (L) 3.87 - 5.11 MIL/uL   Hemoglobin 9.1 (L) 12.0 - 15.0 g/dL   HCT 28.4 (L) 36.0 - 46.0 %   MCV 95.0 80.0 - 100.0 fL   MCH 30.4 26.0 - 34.0 pg   MCHC 32.0 30.0 - 36.0 g/dL   RDW 18.8 (H) 11.5 - 15.5 %   Platelets 6 (LL) 150 - 400 K/uL    Comment: REPEATED TO VERIFY PLATELET COUNT CONFIRMED BY SMEAR Immature Platelet Fraction may be clinically indicated, consider ordering this additional test [id number] THIS CRITICAL RESULT HAS VERIFIED AND BEEN CALLED TO TIM SMITH,RN BY MARVIN SCOTTON ON 08 07 2020 AT 1800, AND HAS BEEN READ BACK.     nRBC 0.6 (H) 0.0 - 0.2 %   Neutrophils Relative % 0 %   Lymphocytes Relative 20 %   Monocytes Relative 2 %   Eosinophils Relative 0 %   Basophils Relative 0 %   Band Neutrophils 0 %   Metamyelocytes Relative 0 %   Myelocytes 0 %   Promyelocytes Relative 0 %   Blasts 78 %   nRBC 1 (H) 0 /100 WBC   Other 0 %   Neutro Abs 0.0 (L) 1.7 - 7.7 K/uL   Lymphs Abs 2.0 0.7 - 4.0 K/uL   Monocytes Absolute 0.2 0.1 - 1.0 K/uL   Eosinophils Absolute 0.0 0.0 - 0.5 K/uL   Basophils Absolute 0.0 0.0 - 0.1 K/uL   WBC Morphology BLASTS     Comment: Performed at [facility], [place] [address] [place], [place] [address]  Comprehensive metabolic panel     Status: Abnormal   Collection Time: 09/05/2018  4:36 PM  Result Value Ref Range   Sodium 137 135 - 145 mmol/L   Potassium 4.1 3.5 - 5.1 mmol/L   Chloride 98 98 - 111 mmol/L   CO2 24 22 - 32 mmol/L   Glucose, Bld 225 (H) 70 - 99 mg/dL   BUN 26 (H) 8 - 23 mg/dL   Creatinine, Ser 1.21 (H) 0.44 - 1.00 mg/dL   Calcium 9.2 8.9 - 10.3 mg/dL   Total Protein 6.8 6.5 - 8.1 g/dL   Albumin 3.8 3.5 - 5.0 g/dL   AST 22 15 - 41 U/L   ALT 18 0 - 44 U/L   Alkaline Phosphatase 30 (L) 38 - 126 U/L   Total Bilirubin 1.1 0.3 - 1.2 mg/dL   GFR calc non Af Amer 41 (L) >60 mL/min   GFR calc Af Amer 48 (L) >60  mL/min   Anion gap 15 5 - 15    Comment: Performed at [facility], [place] [address] [place], [place] [address]  APTT     Status: None   Collection Time: 09/21/2018  4:36 PM  Result Value Ref Range   aPTT 29 24 - 36 seconds    Comment: Performed at [facility], [place] [address], [place], [place] [address]  Protime-INR     Status: None   Collection Time: 09/09/2018  4:36 PM  Result Value Ref Range   Prothrombin Time 14.3 11.4 - 15.2 seconds   INR 1.1 0.8 - 1.2    Comment: (NOTE) INR goal varies based on device and disease states. Performed at [facility], [place] [address], [place], [place] [address]   Lactic acid, plasma     Status: Abnormal   Collection Time: 09/09/2018  6:40 PM  Result Value Ref Range   Lactic Acid, Venous 3.9 (HH) 0.5 - 1.9 mmol/L    Comment: CRITICAL RESULT CALLED TO, READ BACK BY AND VERIFIED WITH: TAYLOR @ 1935 ON 09/23/2018 Sandy Salaam Performed at [facility], [place] [address], [place], [place] [address]   SARS Coronavirus 2 [facility] order, Performed in [facility] hospital lab) Nasopharyngeal Nasopharyngeal Swab     Status: None   Collection Time: 09/15/2018  7:41 PM   Specimen: Nasopharyngeal Swab  Result Value Ref Range   SARS Coronavirus 2 NEGATIVE NEGATIVE    Comment: (NOTE) If result is NEGATIVE SARS-CoV-2 target nucleic acids are NOT DETECTED. The SARS-CoV-2 RNA is generally detectable in upper and lower  respiratory specimens during the acute phase of infection. The lowest  concentration of SARS-CoV-2 viral copies this assay can detect is 250  copies / mL. A negative result does not preclude SARS-CoV-2 infection  and should not be used as the sole basis for treatment or other  patient management decisions.  A negative result may occur with  improper specimen collection / handling, submission of specimen other  than nasopharyngeal swab, presence of viral mutation(s) within the  areas targeted by this assay, and inadequate number of viral copies  (<250  copies / mL). A negative result must be combined with clinical  observations, patient history, and epidemiological information. If result is POSITIVE SARS-CoV-2 target nucleic acids are DETECTED. The SARS-CoV-2 RNA is generally detectable in upper and lower  respiratory specimens dur ing the acute phase of infection.  Positive  results are indicative of active infection with SARS-CoV-2.  Clinical  correlation with patient history and other diagnostic information is  necessary to determine patient infection status.  Positive results do  not rule out bacterial infection or co-infection with other viruses. If result is PRESUMPTIVE POSTIVE SARS-CoV-2 nucleic acids MAY BE PRESENT.   A presumptive positive result was obtained on the submitted specimen  and confirmed on repeat testing.  While 2019 novel coronavirus  (SARS-CoV-2) nucleic acids may be present in the submitted sample  additional confirmatory testing may be necessary for epidemiological  and / or clinical management purposes  to differentiate between  SARS-CoV-2 and other Sarbecovirus currently known to infect humans.  If clinically indicated additional testing with an alternate test  methodology [phone]) is advised. The SARS-CoV-2 RNA is generally  detectable in upper and lower respiratory sp ecimens during the acute  phase of infection. The expected result is Negative. Fact Sheet for Patients:  StrictlyIdeas.no Fact Sheet for Healthcare Providers: BankingDealers.co.za This test is not yet  approved or cleared by the [place] and has been authorized for detection and/or diagnosis of SARS-CoV-2 by FDA under an Emergency Use Authorization (EUA).  This EUA will remain in effect (meaning this test can be used) for the duration of the COVID-19 declaration under Section 564(b)(1) of the Act, 21 U.S.C. section 360bbb-3(b)(1), unless the authorization is terminated or revoked  sooner. Performed at [facility], [place] [address] [place], [place] [address]    Dg Chest Portable 1 View  Result Date: 09/24/2018 CLINICAL DATA:  Shortness of breath, fever EXAM: PORTABLE CHEST 1 VIEW COMPARISON:  02/08/2018 FINDINGS: Mildly enlarged cardiac silhouette, unchanged. Right upper extremity PICC line with distal tip terminating the level of the distal SVC. Blunting of the left costophrenic angle may reflect a small left pleural effusion. No focal airspace consolidation. No pneumothorax. IMPRESSION: Possible small left pleural effusion. Electronically Signed   By: Davina Poke M.D.   On: 09/28/2018 16:54    Pending Labs Unresulted Labs (From admission, onward)    Start     Ordered   09/27/18 0500  CBC with Differential  Daily,   R     09/23/2018 1848   Sep 27, 2018 0500  Comprehensive metabolic panel  Daily,   R     09/05/2018 1848   09/08/2018 2115  Type and screen [place]  Once,   STAT    Comments: [place]    09/14/2018 2114   09/23/2018 1835  Prepare pheresed platelets  (Emergency Blood Administration - Platelets (Pheresed))  Once,   R    Question Answer Comment  Number of Apheresis Units 1 unit (6-10 packs)   Transfusion Indications PLT Count </=10,000/mm      08/31/2018 1834   09/24/2018 1629  Urine culture  ONCE - STAT,   STAT     09/21/2018 1629   09/25/2018 1613  Blood culture (routine x 2)  BLOOD CULTURE X 2,   STAT     09/01/2018 1613   Signed and Held  Comprehensive metabolic panel  Once,   R    Comments: Cal MD for K<3.5 or >5.0    Signed and Held   Signed and Held  CBC  Once,   R    Comments: Call for hg <8.0    Signed and Held          Vitals/Pain Today's Vitals   08/31/2018 2000 09/14/2018 2030 09/21/2018 2040 09/10/2018 2100  BP: (!) 101/56 (!) 102/57  (!) 97/58  Pulse: (!) 107 (!) 110  (!) 103  Resp: (!) 38 (!) 32  (!) 31  Temp:   99.4 F (37.4 C)   TempSrc:   Oral   SpO2: 92% 92%  93%  Weight:       Height:      PainSc:        Isolation Precautions No active isolations  Medications Medications  meperidine (DEMEROL) injection 12.5 mg (has no administration in time range)  acetaminophen (TYLENOL) solution 325 mg (has no administration in time range)  ceFEPIme (MAXIPIME) 2 g in sodium chloride 0.9 % 100 mL IVPB (0 g Intravenous Stopped 09/04/2018 1942)  vancomycin (VANCOCIN) IVPB 1000 mg/200 mL premix (0 mg Intravenous Stopped 09/08/2018 1942)  sodium chloride 0.9 % bolus 1,000 mL (0 mLs Intravenous Stopped 09/06/18 1942)    Mobility walks with device

## 2018-09-06 NOTE — ED Notes (Signed)
Pt personal clothes removed and placed in pt belonging bag on side counter.  Pt was placed in a gown and on the cardiac monitor.  Purewick placed on pt.

## 2018-09-06 NOTE — Telephone Encounter (Signed)
Received a phone call from patient's husband. Patient has been having rigors "shaking like a seizure" for the past 15-20 minutes. She is talking and complaining of generalized pain. She has been cold all morning. Temp taken while RN on the phone and it was 102.9. Husband advised to take patient to ED now. Husband states he is not able to get her out of the house. Husband told to call 911. He confirms he is going to do this now.  Dr. Alvy Bimler notified.

## 2018-09-06 NOTE — ED Notes (Signed)
This nurse offered pt PO Tylenol as ordered, pt reports difficulty swallowing pills, this nurse notified EDP

## 2018-09-06 NOTE — ED Notes (Signed)
Per Dr. Roel Cluck, husband is to stay with pt throughout admission.

## 2018-09-06 NOTE — H&P (Signed)
Claudia Moore CMK:[id number] DOB: March 16, 1933 DOA: 09/26/2018     PCP: [name], MD   Outpatient Specialists:      Oncology   Dr. Alvy Bimler   Patient arrived to ER on 09/14/2018 at 1553  Patient coming from: home Lives   With family    Chief Complaint:  Chief Complaint  Patient presents with  . Weakness  . Fever    HPI: Claudia Moore is a 85 y.o. 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  lasix spironolactone     Hypothyroidism:  Lab Results  Component Value Date   TSH 1.398 01/10/2018   on synthroid     While in ER:  The following Work up has been ordered so far:  Orders Placed This Encounter  Procedures  . Blood culture (routine x 2)  . Urine culture  . SARS Coronavirus 2 [facility] order, Performed in [facility] hospital lab) Nasopharyngeal Nasopharyngeal Swab  . DG Chest Portable 1 View  . Lactic acid, plasma  . CBC with Differential  . Comprehensive metabolic panel  . Urinalysis, Routine w reflex microscopic  . APTT  . Protime-INR  . CBC with Differential  . Comprehensive metabolic panel  . Cardiac monitoring  . Refer to Sidebar Report: Sepsis Sidebar ED/IP  . Document vital signs within 1-hour of fluid bolus completion and notify provider of bolus completion  . Initiate Carrier Fluid Protocol  . Do not attempt resuscitation (DNR)  . Consult to oncology  ALL PATIENTS BEING ADMITTED/HAVING  PROCEDURES NEED COVID-19 SCREENING  . Consult to hospitalist  ALL PATIENTS BEING ADMITTED/HAVING PROCEDURES NEED COVID-19 SCREENING  . Pulse oximetry, continuous  . ED EKG 12-Lead  . Prepare pheresed platelets  . Saline lock IV  . Admit to Inpatient (patient's expected length of stay will be greater than 2 midnights or inpatient only procedure)  . Admit to Inpatient (patient's expected length of stay will be greater than 2 midnights or inpatient only procedure)     Following Medications were ordered in ER: Medications  meperidine (DEMEROL) injection 12.5 mg (has no administration in time range)  acetaminophen (TYLENOL) solution 325 mg (has no administration in time range)  ceFEPIme (MAXIPIME) 2 g in sodium chloride 0.9 % 100 mL IVPB (0 g Intravenous Stopped 09/01/2018 1942)  vancomycin (VANCOCIN) IVPB 1000 mg/200 mL premix (0 mg Intravenous Stopped 09/20/2018 1942)  sodium chloride 0.9 % bolus 1,000 mL (0 mLs Intravenous Stopped 09/16/2018 1942)        Consult Orders  (From admission, onward)         Start     Ordered   09/14/2018 1807  Consult to hospitalist  ALL PATIENTS BEING ADMITTED/HAVING PROCEDURES NEED COVID-19 SCREENING  Once    Comments: ALL PATIENTS BEING ADMITTED/HAVING PROCEDURES NEED COVID-19 SCREENING  Provider:  (Not yet assigned)  Question Answer Comment  Place call to: [facility]   Reason  for Consult Admit      09/04/2018 1806          ER Provider Called:  ONCOLOGY   They Recommend admit to medicine  For comfort care non-aggressive aproach   seen   in ER   Significant initial  Findings: Abnormal Labs Reviewed  LACTIC ACID, PLASMA - Abnormal; Notable for the following components:      Result Value   Lactic Acid, Venous 4.2 (*)    All other components within normal limits  LACTIC ACID, PLASMA - Abnormal; Notable for the following components:   Lactic Acid, Venous 3.9 (*)    All other components within normal limits  CBC WITH DIFFERENTIAL/PLATELET -  Abnormal; Notable for the following components:   RBC 2.99 (*)    Hemoglobin 9.1 (*)    HCT 28.4 (*)    RDW 18.8 (*)    Platelets 6 (*)    nRBC 0.6 (*)    nRBC 1 (*)    Neutro Abs 0.0 (*)    All other components within normal limits  COMPREHENSIVE METABOLIC PANEL - Abnormal; Notable for the following components:   Glucose, Bld 225 (*)    BUN 26 (*)    Creatinine, Ser 1.21 (*)    Alkaline Phosphatase 30 (*)    GFR calc non Af Amer 41 (*)    GFR calc Af Amer 48 (*)    All other components within normal limits  URINALYSIS, ROUTINE W REFLEX MICROSCOPIC - Abnormal; Notable for the following components:   Color, Urine STRAW (*)    Hgb urine dipstick SMALL (*)    All other components within normal limits     Otherwise labs showing:    Recent Labs  Lab 09/04/18 0945 09/05/2018 1636  NA 138 137  K 4.2 4.1  CO2 25 24  GLUCOSE 111* 225*  BUN 23 26*  CREATININE 1.02* 1.21*  CALCIUM 8.9 9.2    Cr   Up from baseline see below Lab Results  Component Value Date   CREATININE 1.21 (H) 09/25/2018   CREATININE 1.02 (H) 09/04/2018   CREATININE 0.92 08/28/2018    Recent Labs  Lab 09/04/18 0945 09/09/2018 1636  AST 13* 22  ALT 13 18  ALKPHOS 32* 30*  BILITOT 0.7 1.1  PROT 6.6 6.8  ALBUMIN 3.8 3.8   Lab Results  Component Value Date   CALCIUM 9.2 09/08/2018       WBC      Component Value Date/Time   WBC 9.9 09/29/2018 1636   ANC    Component Value Date/Time   NEUTROABS 0.0 (L) 09/10/2018 1636   ALC No results found for: LYMPHOABS    Plt: Lab Results  Component Value Date   PLT 6 (LL) 09/25/2018    Lactic Acid, Venous    Component Value Date/Time   LATICACIDVEN 3.9 (HH) 09/24/2018 1840     Lab Results  Component Value Date   SARSCOV2NAA NEGATIVE 09/10/2018      HG/HCT  stable,      Component Value Date/Time   HGB 9.1 (L) 09/17/2018 1636   HGB 8.1 (L) 09/04/2018 0945   HCT 28.4 (L) 09/16/2018 1636   HCT 17.6 (LL) 07/24/2018 0829    DM  labs:   HbA1C: No results for input(s): HGBA1C in the last 8760 hours.     CBG (last 3)  No results for input(s): GLUCAP in the last 72 hours.     UA  evidence of UTI  Urine analysis:    Component Value Date/Time   COLORURINE STRAW (A) 09/02/2018 1633   APPEARANCEUR CLEAR 09/25/2018 1633   LABSPEC 1.011 09/27/2018 1633   PHURINE 5.0 09/20/2018 1633   GLUCOSEU NEGATIVE 09/04/2018 1633   HGBUR SMALL (A) 09/29/2018 1633   BILIRUBINUR NEGATIVE 09/19/2018 1633   KETONESUR NEGATIVE 09/05/2018 1633   PROTEINUR NEGATIVE 09/14/2018 1633   NITRITE NEGATIVE 09/23/2018 1633   LEUKOCYTESUR NEGATIVE 09/22/2018 1633      CXR - left pleural effusion   ECG:  Personally reviewed by me showing: HR : 107 Rhythm  Sinus tachycardia   no evidence of ischemic changes QTC 426      ED Triage Vitals  Enc Vitals Group     BP 09/22/2018 1609 (!) 136/59     Pulse Rate 09/08/2018 1609 (!) 113     Resp --      Temp 09/29/2018 1609 (!) 101.4 F (38.6 C)     Temp Source 09/18/2018 1609 Oral     SpO2 09/21/2018 1603 97 %     Weight 09/03/2018 1613 159 lb (72.1 kg)     Height 09/30/2018 1613 5\' 4"  (1.626 m)     Head Circumference --      Peak Flow --      Pain Score 09/23/2018 1613 0     Pain Loc --      Pain Edu? --      Excl. in [place]? --   TMAX(24)@       Latest  Blood pressure (!) 136/59, pulse (!) 125, temperature (!) 101.4 F (38.6 C), temperature source Oral, height 5\' 4"  (1.626 m), weight 72.1 kg, SpO2 98 %.     Hospitalist was called for admission for acute leukemia blast crisis with febrile neutropenia per family and patient no aggressive measures   Review of Systems:    Pertinent positives include: Fevers, chills, fatigue,  Constitutional:  No weight loss, night sweats,  weight loss  HEENT:  No headaches, Difficulty swallowing,Tooth/dental problems,Sore throat,  No sneezing, itching, ear ache, nasal congestion, post nasal drip,  Cardio-vascular:  No chest pain, Orthopnea, PND, anasarca,  dizziness, palpitations.no Bilateral lower extremity swelling  GI:  No heartburn, indigestion, abdominal pain, nausea, vomiting, diarrhea, change in bowel habits, loss of appetite, melena, blood in stool, hematemesis Resp:  no shortness of breath at rest. No dyspnea on exertion, No excess mucus, no productive cough, No non-productive cough, No coughing up of blood.No change in color of mucus.No wheezing. Skin:  no rash or lesions. No jaundice GU:  no dysuria, change in color of urine, no urgency or frequency. No straining to urinate.  No flank pain.  Musculoskeletal:  No joint pain or no joint swelling. No decreased range of motion. No back pain.  Psych:  No change in mood or affect. No depression or anxiety. No memory loss.  Neuro: no localizing neurological complaints, no tingling, no weakness, no double vision, no gait abnormality, no slurred speech, no confusion  All systems reviewed and apart from [place] all are negative  Past Medical History:   Past Medical History:  Diagnosis Date  . Bilateral leg edema   . Chronic ITP (idiopathic thrombocytopenia) (HCC)   . Chronic leukopenia   . Depression   . Hypertension   . Thyroid disease       Past Surgical History:  Procedure Laterality Date  . ABDOMINAL HYSTERECTOMY     endometriosis   . APPENDECTOMY    . CHOLECYSTECTOMY    .  COLONOSCOPY    . TONSILLECTOMY      Social History:  Ambulatory  independently      reports that she has never smoked. She has never used smokeless tobacco. She reports that she does not drink alcohol or use drugs.   Family History:   Family History  Problem Relation Age of Onset  . Cancer Mother        brain tumor  . Cancer Father        colon cancer    Allergies: Allergies  Allergen Reactions  . [place] Anaphylaxis    PIne Nuts  . Celebrex [Celecoxib] Other (See Comments)    Increase Heart Rate, Sweats and Nausea  . Zyrtec [Cetirizine] Other (See Comments)    Rapid heart rate and  dizziness     Prior to Admission medications   Medication Sig Start Date End Date Taking? Authorizing Provider  Cholecalciferol (VITAMIN D) 50 MCG (2000 UT) tablet Take 2,000 Units by mouth daily.    [provider]  escitalopram (LEXAPRO) 10 MG tablet Take 10 mg by mouth daily. 01/05/15   [provider]  furosemide (LASIX) 40 MG tablet TAKE 1 TABLET BY MOUTH EVERY DAY 07/18/18   Heath Lark, MD  hydroxyurea (HYDREA) 500 MG capsule Take 1 capsule (500 mg total) by mouth daily. May take with food to minimize GI side effects. 08/28/18   Heath Lark, MD  levothyroxine (SYNTHROID, LEVOTHROID) 100 MCG tablet Take 100 mcg by mouth daily before breakfast.    [provider]  Morphine Sulfate (MORPHINE CONCENTRATE) 10 MG/0.5ML SOLN concentrated solution Take 0.5 mLs (10 mg total) by mouth every 2 (two) hours as needed. 08/21/18   Heath Lark, MD  Multiple Vitamins-Minerals (MULTIVITAMIN GUMMIES ADULT) CHEW Chew 1 tablet by mouth at bedtime.    [provider]  predniSONE (DELTASONE) 10 MG tablet Take 2 tablets (20 mg total) by mouth daily with breakfast. 07/29/18   Heath Lark, MD  spironolactone (ALDACTONE) 25 MG tablet TAKE 1 TABLET BY MOUTH EVERY DAY 09/03/18   Heath Lark, MD   Physical Exam: Blood pressure (!) 136/59, pulse (!) 125, temperature (!) 101.4 F (38.6 C), temperature source Oral, height 5\' 4"  (1.626 m), weight 72.1 kg, SpO2 98 %. 1. General:  in No  Acute distress   Chronically ill and acutely ill-appearing 2. Psychological: Alert and   Oriented 3. Head/ENT:   Moist   Mucous Membranes                          Head Non traumatic, neck supple                            Poor Dentition 4. SKIN:  decreased Skin turgor,  Skin clean Dry and intact no rash 5. Heart: Regular rate and rhythm no   Murmur, no Rub or gallop 6. Lungs:   no wheezes some crackles   7. Abdomen: Soft,  non-tender, Non distended bowel sounds present 8. Lower extremities: no clubbing,  cyanosis, trace edema 9. Neurologically Grossly intact, moving all 4 extremities equally  10. MSK: Normal range of motion   All other LABS:     Recent Labs  Lab 09/04/18 0945 09/02/2018 1636  WBC 25.8* 9.9  NEUTROABS  --  0.0*  HGB 8.1* 9.1*  HCT 24.8* 28.4*  MCV 94.7 95.0  PLT 9* 6*     Recent Labs  Lab 09/04/18  0945 09/11/2018 1636  NA 138 137  K 4.2 4.1  CL 102 98  CO2 25 24  GLUCOSE 111* 225*  BUN 23 26*  CREATININE 1.02* 1.21*  CALCIUM 8.9 9.2     Recent Labs  Lab 09/04/18 0945 09/19/2018 1636  AST 13* 22  ALT 13 18  ALKPHOS 32* 30*  BILITOT 0.7 1.1  PROT 6.6 6.8  ALBUMIN 3.8 3.8       Cultures:    Component Value Date/Time   SDES  02/10/2018 1045    FACE LEFT Performed at [facility], [place] [address] [place], [place] [address]    SPECREQUEST  02/10/2018 1045    NONE Performed at [facility], [place] [address] [place], [place] [address]    CULT  02/10/2018 1045    RARE NORMAL SKIN FLORA Performed at [facility], [place] [address] [place], [place] [address]    REPTSTATUS 02/13/2018 FINAL 02/10/2018 1045     Radiological Exams on Admission: Dg Chest Portable 1 View  Result Date: 09/24/2018 CLINICAL DATA:  Shortness of breath, fever EXAM: PORTABLE CHEST 1 VIEW COMPARISON:  02/08/2018 FINDINGS: Mildly enlarged cardiac silhouette, unchanged. Right upper extremity PICC line with distal tip terminating the level of the distal SVC. Blunting of the left costophrenic angle may reflect a small left pleural effusion. No focal airspace consolidation. No pneumothorax. IMPRESSION: Possible small left pleural effusion. Electronically Signed   By: Davina Poke M.D.   On: 09/01/2018 16:54    Chart has been reviewed    Assessment/Plan   85 y.o. 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 Acute leukemia ([place]) -acute blast crisis discussed at length with oncology patient overall very poor prognosis at this time mainly comfort care okay with antibiotics and transfusions as needed but no aggressive chemotherapy interventions continue prednisone hold hydroxyurea  . Neutropenic fever ([place]) -secondary to acute leukemia with blast crisis overall very poor prognosis expecting only few days.  Patient is rapidly deteriorating family and patient now aware at this point choosing to avoid aggressive interventions such as chemotherapy would pursue only antibiotics and transfusions as needed.  Otherwise comfort care. Continue cefepime. . Chronic ITP (idiopathic thrombocytopenia) (HCC) -chronic currently severe thrombocytopenia with platelet count down to 6 most likely secondary to acute leukemia.  Will transfuse . Thrombocytopenia (HCC) platelets down to 6 no evidence of acute bleeding but given febrile illness will transfuse  . AKI (acute kidney injury) ([place]) -in the setting of acute leukemia with blast crisis currently comfort care fluid overload noted will avoid continuing IV fluids for now and hold Lasix given soft blood pressures . Essential hypertension hold blood pressure medications given hypotension   History of diastolic CHF given hypotension AKI will hold off on Lasix for now  Acute respiratory failure with hypoxia -patient is at risk of respiratory failure and ARDS progression secondary to acute blastic crisis And at this point comfort care not a candidate for intubation will continue with nasal cannula oxygen can increase to nonrebreather as needed   New onset hyperglycemia likely related to steroids given overall poor prognosis and short life expectancy for now we will manage conservatively  other plan as per orders.  DVT prophylaxis:  SCD    Code  Status:   DNR/DNI  comfort care as per patient and family  I had personally discussed CODE STATUS with patient and family   Family Communication:   Family not at  Bedside  plan of care was discussed  with   Husband,    Disposition Plan:     To home once workup is complete and patient is stable                                      Palliative care    consulted                                   Consults called:  ONCOLOGY aware will continue to follow  Admission status:  ED Disposition    ED Disposition Condition [place]: [facility] [[id number]]  Level of Care: Med-Surg [16]  Covid Evaluation: Confirmed COVID Negative  Diagnosis: Neutropenic fever ([place]) [726203]  Admitting Physician: Toy Baker [[id number]]  Attending Physician: Toy Baker [[id number]]  Estimated length of stay: 3 - 4 days  Certification:: I certify this patient will need inpatient services for at least 2 midnights  PT Class (Do Not Modify): Inpatient [101]  PT Acc Code (Do Not Modify): Private [1]        inpatient     Expect 2 midnight stay secondary to severity of patient's current illness including   hemodynamic instability despite optimal treatment (tachycardia  tachypnea  hypoxia,  )   Severe lab/radiological/exam abnormalities including: Cute leukemia with blast crisis   and extensive comorbidities including:  CHF   That are currently affecting medical management.   I expect  patient to be hospitalized for 2 midnights requiring inpatient medical care.  Patient is at high risk for adverse outcome (such as loss of life or disability) if not treated.  Indication for inpatient stay as follows:  Severe change from baseline regarding mental status Hemodynamic instability despite maximal medical therapy,    severe pain requiring acute inpatient management,      New or worsening hypoxia  Need for IV antibiotics,   IV pain medications       Level of care       medical floor   Precautions:  NONE   No active isolations  PPE: Used by the provider:   P100  eye Goggles,  Gloves      Talbert Trembath 09/10/2018, 9:07 PM    [facility]     after 2 AM please page floor coverage PA If 7AM-7PM, please contact the day team taking care of the patient using [email or web address]

## 2018-09-06 NOTE — ED Notes (Signed)
X-ray at bedside

## 2018-09-06 NOTE — ED Notes (Signed)
Date and time results received: 09/05/2018  7:35 PM  Test: Lactic Acid  Critical Value: 3.9  Name of Provider Notified: Roslynn Amble MD  Learta Codding Received? Or Actions Taken?: see orders for details

## 2018-09-06 NOTE — ED Notes (Signed)
Oncology at bedside

## 2018-09-06 NOTE — Consult Note (Signed)
Referral MD  Reason for Referral: Acute leukemia-likely progressive with marked neutropenia and thrombocytopenia  Chief Complaint  Patient presents with  . Weakness  . Fever  : I had a fever at home.  HPI: Claudia Moore is a very nice 85-year-old white female.  She is originally from [place].  Her husband is with her.  They have a an incredibly interesting story in which they actually met when they were 2 weeks old at the hospital in [place].  They have been together ever since.  They have been married 62 years.  This is literally a [employer] Movie in the making.  She was recent diagnosed with acute leukemia.  The pathologist could not tell if this was myeloid or lymphoid leukemia.  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  She is being treated with Hydrea.  She is been getting labs weekly and getting blood transfusions for supportive care.  She last had a blood and platelets 2 days ago.  She had a fever at home.  She was shivering.  I would not say that she had rigors.  She apparently had a oral infection.  She bit her tongue.  This developed into some type of infection.  She was given some liquid antibiotics.  She now comes in with marked chills.  She has some slight confusion.  Her husband says this happens when she gets sick.  There is a little bit of a cough.  She has had no bleeding.  She has had no diarrhea.  She has had no obvious rashes.  When she was seen in the ER, her white cell count was 9.9.  Hemoglobin 9.1.  Platelet count was 6000.  Unfortunate, she had 0 neutrophils.  She had 80% blasts.  Her blood sugar was 225.  BUN 26 and creatinine 1.21.  Cultures have been taken.  A chest x-ray was done which showed a possible small right pleural effusion.  She really does not have much of an appetite.  Overall, I would say her performance status is ECOG 3-4.     Past Medical History:  Diagnosis Date  . Bilateral leg edema   . Chronic ITP  (idiopathic thrombocytopenia) (HCC)   . Chronic leukopenia   . Depression   . Hypertension   . Thyroid disease   :  Past Surgical History:  Procedure Laterality Date  . ABDOMINAL HYSTERECTOMY     endometriosis   . APPENDECTOMY    . CHOLECYSTECTOMY    . COLONOSCOPY    . TONSILLECTOMY    :   Current Facility-Administered Medications:  .  acetaminophen (TYLENOL) solution 325 mg, 325 mg, Oral, Q6H PRN, Ennever, Peter R, MD .  meperidine (DEMEROL) injection 12.5 mg, 12.5 mg, Intravenous, Q4H PRN, Ennever, Peter R, MD .  vancomycin (VANCOCIN) IVPB 1000 mg/200 mL premix, 1,000 mg, Intravenous, Once, Dykstra, Richard S, MD, Last Rate: 200 mL/hr at 08/31/2018 1841, 1,000 mg at 09/22/2018 1841  Current Outpatient Medications:  .  Cholecalciferol (VITAMIN D) 50 MCG (2000 UT) tablet, Take 2,000 Units by mouth daily., Disp: , Rfl:  .  escitalopram (LEXAPRO) 10 MG tablet, Take 10 mg by mouth daily., Disp: , Rfl: 3 .  furosemide (LASIX) 40 MG tablet, TAKE 1 TABLET BY MOUTH EVERY DAY, Disp: 30 tablet, Rfl: 1 .  hydroxyurea (HYDREA) 500 MG capsule, Take 1 capsule (500 mg total) by mouth daily. May take with food to minimize GI side effects., Disp: 30 capsule, Rfl: 11 .    levothyroxine (SYNTHROID, LEVOTHROID) 100 MCG tablet, Take 100 mcg by mouth daily before breakfast., Disp: , Rfl:  .  Morphine Sulfate (MORPHINE CONCENTRATE) 10 MG/0.5ML SOLN concentrated solution, Take 0.5 mLs (10 mg total) by mouth every 2 (two) hours as needed., Disp: 120 mL, Rfl: 0 .  Multiple Vitamins-Minerals (MULTIVITAMIN GUMMIES ADULT) CHEW, Chew 1 tablet by mouth at bedtime., Disp: , Rfl:  .  predniSONE (DELTASONE) 10 MG tablet, Take 2 tablets (20 mg total) by mouth daily with breakfast., Disp: 60 tablet, Rfl: 1 .  spironolactone (ALDACTONE) 25 MG tablet, TAKE 1 TABLET BY MOUTH EVERY DAY, Disp: 30 tablet, Rfl: 1:  :  Allergies  Allergen Reactions  . Pine Anaphylaxis    PIne Nuts  . Celebrex [Celecoxib] Other (See Comments)     Increase Heart Rate, Sweats and Nausea  . Zyrtec [Cetirizine] Other (See Comments)    Rapid heart rate and dizziness  :  Family History  Problem Relation Age of Onset  . Cancer Mother        brain tumor  . Cancer Father        colon cancer  :  Social History   Socioeconomic History  . Marital status: Married    Spouse name: Not on file  . Number of children: Not on file  . Years of education: Not on file  . Highest education level: Not on file  Occupational History  . Not on file  Social Needs  . Financial resource strain: Not on file  . Food insecurity    Worry: Not on file    Inability: Not on file  . Transportation needs    Medical: Not on file    Non-medical: Not on file  Tobacco Use  . Smoking status: Never Smoker  . Smokeless tobacco: Never Used  Substance and Sexual Activity  . Alcohol use: No  . Drug use: No  . Sexual activity: Not on file    Comment: 1 biological child and 1 adopted. Married. retired secretary work  Lifestyle  . Physical activity    Days per week: Not on file    Minutes per session: Not on file  . Stress: Not on file  Relationships  . Social connections    Talks on phone: Not on file    Gets together: Not on file    Attends religious service: Not on file    Active member of club or organization: Not on file    Attends meetings of clubs or organizations: Not on file    Relationship status: Not on file  . Intimate partner violence    Fear of current or ex partner: Not on file    Emotionally abused: Not on file    Physically abused: Not on file    Forced sexual activity: Not on file  Other Topics Concern  . Not on file  Social History Narrative  . Not on file  :  Review of Systems  Constitutional: Positive for fever and malaise/fatigue.  HENT: Positive for sore throat.   Eyes: Negative.   Respiratory: Positive for shortness of breath.   Cardiovascular: Positive for palpitations and leg swelling.  Gastrointestinal:  Positive for heartburn.  Genitourinary: Negative.   Musculoskeletal: Positive for joint pain and myalgias.  Skin: Negative.   Neurological: Negative.   Endo/Heme/Allergies: Negative.   Psychiatric/Behavioral: Negative.      Exam: Ill-appearing white female.  She is slightly lethargic.  She does try to answer questions.  Head neck exam shows no   scleral icterus.  Conjunctiva are pale.  I really cannot look too much into her oral cavity.  I do not see anything obvious that was mucositis.  There is no adenopathy in the neck.  Lungs show some scattered crackles bilaterally.  Cardiac exam regular rate and rhythm.  She has 1/6 systolic ejection murmur.  Abdomen is soft.  Bowel sounds are present.  There is no fluid wave.  There is no palpable liver or spleen tip.  Extremities shows some 1+ edema in her legs.  Skin exam shows occasional ecchymoses.  She has some petechia.  Neurological exam is nonfocal.  Patient Vitals for the past 24 hrs:  BP Temp Temp src Pulse SpO2 Height Weight  09/24/2018 1613 - - - - - 5' 4" (1.626 m) 159 lb (72.1 kg)  09/01/2018 1611 (!) 136/59 - - (!) 125 98 % - -  09/05/2018 1609 (!) 136/59 (!) 101.4 F (38.6 C) Oral (!) 113 98 % - -  09/27/2018 1603 - - - - 97 % - -     Recent Labs    09/04/18 0945 09/02/2018 1636  WBC 25.8* 9.9  HGB 8.1* 9.1*  HCT 24.8* 28.4*  PLT 9* 6*   Recent Labs    09/04/18 0945 09/22/2018 1636  NA 138 137  K 4.2 4.1  CL 102 98  CO2 25 24  GLUCOSE 111* 225*  BUN 23 26*  CREATININE 1.02* 1.21*  CALCIUM 8.9 9.2    Blood smear review: Pending  Pathology: None    Assessment and Plan: Claudia Moore is a 85-year-old white female.  She has a nonspecific acute leukemia.  She has multiple chromosomal abnormalities.  I think that it certainly looks to be that her leukemia is progressing along.  The fact that she has no neutrophils is quite troublesome.  Even with antibiotics, I think it might be difficult for her to get through any infection that  she may be having.  We will have to see what the cultures show.  I really think that her comfort needs to be focused on.  She appears to be uncomfortable right now.  She has these shivers.  I will give her a little bit of Demerol to see if this may help a little bit.  I did talk to Claudia Moore and her husband about end-of-life issues.  I think they have talked to Dr. Gorsuch about this.  They do not want heroic measures to be taken.  She does not want to be on life support.  There is not to be any CPR administered.  As such, she is a DO NOT RESUSCITATE.  I think this is very reasonable given her overall situation and the outcome which in all likelihood is going to be quite limited.  She needs platelets.  The ER doc order these already.  It is possible and not surprising if she has alloimmunization to platelet transfusions.  We may have to consider Amicar for her if there is any bruising or bleeding.  Again, I really think the problem here is going to be her lack of neutrophils which will severely limit her ability to fight infection.  The fever that she has could certainly be coming from the leukemia itself.  The Demerol should help with this.  SHE IS NOT TO HAVE ANY RECTAL ADMINISTRATION OF ANY MEDICATIONS.  We will follow along.  I will let Dr. Gorsuch know of her admission.  I very much appreciate the great care that I know   she will get from all the staff on her floor.  Lattie Haw, MD  Psalm 73:26

## 2018-09-06 NOTE — ED Triage Notes (Signed)
Pt to ED via EMS , pt from home, cancer pt, last treatment Wednesday. Pt started feeling bad this morning c/o generalized weakness, fever, N&V. Pt orientation at baseline .

## 2018-09-06 NOTE — ED Notes (Addendum)
Change in mental status, pt is more confused, pt breathing more labored, o2 increased to 3L this nurse notified EDP.

## 2018-09-06 NOTE — ED Notes (Addendum)
Pt's husband at bedside.  Husband advised Network engineer and EMT that he thinks she needs to use restroom.  Upon checking on pt, reminded her that she has a purewick and is free to urinate as needed.  Pt is slightly more agitated and confused than when she arrived. Notified RN and EDP. Dr. Roslynn Amble went to check on pt at that time.

## 2018-09-06 NOTE — ED Provider Notes (Signed)
[facility] DEPT Provider Note   CSN: [id number] Arrival date & time: 09/21/2018  1553     History   Chief Complaint Chief Complaint  Patient presents with  . Weakness  . Fever    HPI Claudia Moore is a 85 y.o. female.  History of acute leukemia managed by Dr. Alvy Bimler.  Presents emergency department with fever.  States symptom has been associated with generalized weakness, and having episodes of severe Reiger's.  Temperature was up to 102.9 at home.  Patient denies any other specific symptoms, specifically abdominal pain, chest pain, cough or difficulty breathing.  States she just feels very weak and fatigued.     HPI  Past Medical History:  Diagnosis Date  . Bilateral leg edema   . Chronic ITP (idiopathic thrombocytopenia) (HCC)   . Chronic leukopenia   . Depression   . Hypertension   . Thyroid disease     Patient Active Problem List   Diagnosis Date Noted  . Oral pain 09/04/2018  . Weight loss 08/22/2018  . Tongue infection 08/21/2018  . Acute leukemia ([place]) 07/26/2018  . Deficiency anemia 07/23/2018  . Autoimmune disorder ([place]) 07/16/2018  . Hypokalemia 04/05/2018  . Essential hypertension 03/07/2018  . Cancer of skin of external cheek 03/01/2018  . Pancytopenia, acquired ([place]) 03/01/2018  . Acute gastroenteritis 02/08/2018  . AKI (acute kidney injury) ([place]) 02/08/2018  . Hyponatremia 02/08/2018  . Elevated troponin 02/08/2018  . Elevated AST (SGOT) 02/08/2018  . Skin lesion 01/29/2018  . Leukopenia 01/10/2018  . Tremor of both hands 01/02/2018  . Vitamin B12 deficiency 01/01/2018  . Preventive measure 10/30/2017  . Goals of care, counseling/discussion 10/16/2017  . Serum sickness due to drug 10/10/2017  . Hyperuricemia 09/26/2017  . Chronic ITP (idiopathic thrombocytopenia) (HCC) 09/25/2017  . Hives 09/07/2017  . Gout 09/07/2017  . Bilateral leg edema 09/07/2017  . Thrombocytopenia ([place]) 02/12/2015  . Nonalcoholic fatty  liver disease 02/12/2015  . Bile salt-induced diarrhea 02/12/2015  . Keratosis, seborrheic 02/12/2015    Past Surgical History:  Procedure Laterality Date  . ABDOMINAL HYSTERECTOMY     endometriosis   . APPENDECTOMY    . CHOLECYSTECTOMY    . COLONOSCOPY    . TONSILLECTOMY       OB History   No obstetric history on file.      Home Medications    Prior to Admission medications   Medication Sig Start Date End Date Taking? Authorizing Provider  Cholecalciferol (VITAMIN D) 50 MCG (2000 UT) tablet Take 2,000 Units by mouth daily.    [provider]  escitalopram (LEXAPRO) 10 MG tablet Take 10 mg by mouth daily. 01/05/15   [provider]  furosemide (LASIX) 40 MG tablet TAKE 1 TABLET BY MOUTH EVERY DAY 07/18/18   Heath Lark, MD  hydroxyurea (HYDREA) 500 MG capsule Take 1 capsule (500 mg total) by mouth daily. May take with food to minimize GI side effects. 08/28/18   Heath Lark, MD  levothyroxine (SYNTHROID, LEVOTHROID) 100 MCG tablet Take 100 mcg by mouth daily before breakfast.    [provider]  Morphine Sulfate (MORPHINE CONCENTRATE) 10 MG/0.5ML SOLN concentrated solution Take 0.5 mLs (10 mg total) by mouth every 2 (two) hours as needed. 08/21/18   Heath Lark, MD  Multiple Vitamins-Minerals (MULTIVITAMIN GUMMIES ADULT) CHEW Chew 1 tablet by mouth at bedtime.    [provider]  predniSONE (DELTASONE) 10 MG tablet Take 2 tablets (20 mg total) by mouth daily with breakfast.  07/29/18   Heath Lark, MD  spironolactone (ALDACTONE) 25 MG tablet TAKE 1 TABLET BY MOUTH EVERY DAY 09/03/18   Heath Lark, MD    Family History Family History  Problem Relation Age of Onset  . Cancer Mother        brain tumor  . Cancer Father        colon cancer    Social History Social History   Tobacco Use  . Smoking status: Never Smoker  . Smokeless tobacco: Never Used  Substance Use Topics  . Alcohol use: No  . Drug use: No     Allergies   Pine, Celebrex  [celecoxib], and Zyrtec [cetirizine]   Review of Systems Review of Systems  Constitutional: Positive for chills, fatigue and fever.  HENT: Negative for ear pain and sore throat.   Eyes: Negative for pain and visual disturbance.  Respiratory: Negative for cough and shortness of breath.   Cardiovascular: Negative for chest pain and palpitations.  Gastrointestinal: Positive for nausea. Negative for abdominal pain and vomiting.  Genitourinary: Negative for dysuria and hematuria.  Musculoskeletal: Negative for arthralgias and back pain.  Skin: Negative for color change and rash.  Neurological: Negative for seizures and syncope.  All other systems reviewed and are negative.    Physical Exam Updated Vital Signs BP (!) 136/59   Pulse (!) 113   Temp (!) 101.4 F (38.6 C) (Oral)   Ht 5\' 4"  (1.626 m)   Wt 72.1 kg   SpO2 98%   BMI 27.29 kg/m   Physical Exam Constitutional:      Comments: Chronically ill-appearing, elderly Claudia  HENT:     Head: Normocephalic and atraumatic.     Mouth/Throat:     Mouth: Mucous membranes are dry.  Eyes:     Extraocular Movements: Extraocular movements intact.     Pupils: Pupils are equal, round, and reactive to light.  Neck:     Musculoskeletal: Normal range of motion and neck supple.  Cardiovascular:     Heart sounds: Normal heart sounds.     Comments: Tachycardic, regular rhythm Pulmonary:     Comments: Mild tachypnea, somewhat shallow breathing but no crackles or wheezing Abdominal:     General: Abdomen is flat. Bowel sounds are normal.     Palpations: Abdomen is soft.  Musculoskeletal:        General: No swelling or tenderness.  Skin:    General: Skin is warm and dry.     Capillary Refill: Capillary refill takes less than 2 seconds.     Coloration: Skin is pale.  Neurological:     General: No focal deficit present.     Comments: Mildly confused but oriented x3      ED Treatments / Results  Labs (all labs ordered are listed, but  only abnormal results are displayed) Labs Reviewed  CULTURE, BLOOD (ROUTINE X 2)  CULTURE, BLOOD (ROUTINE X 2)  LACTIC ACID, PLASMA  LACTIC ACID, PLASMA  CBC WITH DIFFERENTIAL/PLATELET  COMPREHENSIVE METABOLIC PANEL  URINALYSIS, ROUTINE W REFLEX MICROSCOPIC    EKG None  Radiology No results found.  Procedures Procedures (including critical care time)  Medications Ordered in ED Medications - No data to display   Initial Impression / Assessment and Plan / ED Course  I have reviewed the triage vital signs and the nursing notes.  Pertinent labs & imaging results that were available during my care of the patient were reviewed by me and considered in my medical decision making (see chart  for details).  Clinical Course as of Sep 06 2350  Fri Sep 06, 2018  1904 Long family discussion, interested in pursuing comfort care, not interested in pursuing aggressive chemotherapy and cancer treatment, but would like to continue antibiotics tonight, open to reevaluating in the morning - confirmed DNR/DNI   [RD]  1942 Discussed with hospitalist, will accept   [RD]    Clinical Course User Index [RD] Lucrezia Starch, MD       85 year old Claudia with acute leukemia on Hydrea presents emerged department with fever.  Concern for neutropenic fever.  Obtain blood cultures, started broad-spectrum antibiotics soon after arrival.  No clear source for fever.  Consulted her oncologist.  On review of differential, patient had 78% blasts, 0% neutrophils.  Strongly recommended focus on care should be comfort.  I had a long family discussion.  Patient will be DNR/DNI, patient and husband are not interested in pursuing aggressive chemotherapy but are interested in continuing antibiotics for tonight.  They are interested in pursuing comfort measures should patient worsen over tonight and tomorrow.  Discussed case with hospitalist service who agrees to admit patient to a telemetry bed.   Final Clinical  Impressions(s) / ED Diagnoses   Final diagnoses:  Neutropenic fever ([place])  Acute leukemia not having achieved remission (HCC)  Sepsis, due to unspecified organism, unspecified whether acute organ dysfunction present [facility])    ED Discharge Orders    None       Lucrezia Starch, MD 08/31/2018 2356

## 2018-09-06 NOTE — ED Notes (Signed)
Dr. Roel Cluck increased O2 to 5L [place]

## 2018-09-07 LAB — ABO/RH: ABO/RH(D): A POS

## 2018-09-07 LAB — BLOOD CULTURE ID PANEL (REFLEXED)

## 2018-09-07 MED ORDER — SODIUM CHLORIDE 0.9 % IV SOLN
INTRAVENOUS | Status: DC
  Administered 2018-09-07: 04:00:00 via INTRAVENOUS

## 2018-09-07 MED ORDER — FUROSEMIDE 10 MG/ML IJ SOLN
20.0000 mg | Freq: Once | INTRAMUSCULAR | Status: AC
  Administered 2018-09-07: 20 mg via INTRAVENOUS
  Filled 2018-09-07: qty 2

## 2018-09-07 MED ORDER — SODIUM CHLORIDE 0.9 % IV SOLN: INTRAVENOUS | Status: DC

## 2018-09-07 MED ORDER — FUROSEMIDE 10 MG/ML IJ SOLN: 20.0000 mg | Freq: Once | INTRAMUSCULAR | Status: DC

## 2018-09-08 LAB — PREPARE PLATELET PHERESIS: Unit division: 0

## 2018-09-08 LAB — BPAM PLATELET PHERESIS
Blood Product Expiration Date: 202008092359
ISSUE DATE / TIME: 202008072341
Unit Type and Rh: 5100

## 2018-09-08 LAB — URINE CULTURE

## 2018-09-09 LAB — CULTURE, BLOOD (ROUTINE X 2)
Special Requests: ADEQUATE
Special Requests: ADEQUATE

## 2018-09-11 ENCOUNTER — Ambulatory Visit: Payer: Medicare Other | Admitting: Hematology and Oncology

## 2018-09-11 ENCOUNTER — Other Ambulatory Visit: Payer: Medicare Other

## 2018-09-18 ENCOUNTER — Other Ambulatory Visit: Payer: Medicare Other

## 2018-09-18 ENCOUNTER — Ambulatory Visit: Payer: Medicare Other | Admitting: Hematology and Oncology

## 2018-09-19 ENCOUNTER — Other Ambulatory Visit: Payer: Self-pay | Admitting: Hematology and Oncology

## 2018-09-22 ENCOUNTER — Other Ambulatory Visit: Payer: Self-pay | Admitting: Hematology and Oncology

## 2018-09-25 ENCOUNTER — Other Ambulatory Visit: Payer: Medicare Other

## 2018-09-25 ENCOUNTER — Ambulatory Visit: Payer: Medicare Other | Admitting: Hematology and Oncology

## 2018-10-01 NOTE — Progress Notes (Signed)
Patient arrived to the unit at approximately 2204 accompanied by her spouse. Patient is alert and and verbally responsive. However, noted blood tinged stools and systolic BP in the 18'D on arrival to the unit.PD Sofia informed of BP of 75/45. 500cc bolus NS started.

## 2018-10-01 NOTE — Progress Notes (Signed)
Patient ID: KENNEDI LIZARDO, female   DOB: 02/27/1933, 85 y.o.   MRN: [id number]  Ms Philipps is a 85 y/o diagnosed with acute leukemia.  She was admitted last pm. Pt's platelets had decreased to 6.  Pt had platelets ordered in ED.   Pt had decreased blood pressure and decreased oxygen.  Rapid response evaluated pt.  IV fluids bolus ordered.  Pt had minimal improvement of blood pressure after 1000 cc of fluids.  Rn reports pt became more short of breath.  She is concerned that pt had no urine output.  Pt has a history of CHF.  Pt given Iv lasix 20mg .  Pt had a small amount of urine output.  Pt given additional lasix 20mg .   RN reports pt having rectal bleeding.  Pt receiving platelets.   I reviewed pt's notes.  Pt is a DNR. Pt admitted for comfort care.    Rn Kennyth Lose reported pt passed at 4:32am.

## 2018-10-01 NOTE — Progress Notes (Signed)
Noted deteriorating condition of patient and she was no longer following commands. Spoke with spouse about her imminent death. He stated " I don't think she will make it to the morning". Emotional support provided. Went to check on patient at approximately [phone] and noted she was not breathing. Spouse stated she stopped breathing at approximately [phone]. She was pronounced death at approximately 54. Emotional support provided. Spouse took personal belongings but left 2 rings. Same were removed and call made to spouse by Myriam Jacobson, the CN.

## 2018-10-01 NOTE — Progress Notes (Signed)
Patients o2 saturation continues to fall. Respiratory therapy was called and non-re breather started at 0149. O2 sats came up the the 90's. Had Lasix 20 IV at 0139 with poor effect. o2 sats continues too fall in the 80's. Spouse at bedside and kept informed.

## 2018-10-01 NOTE — Discharge Summary (Signed)
Death summary  Death Summary  Claudia Moore [id number] DOB: 1933-04-21 DOA: 09-18-2018  PCP: [name], MD    Admit date: Sep 18, 2018 Date of Death: 2018/09/19  Final Diagnoses:  Active Problems:   Acute leukemia,  neutropenic fever,  AKI,  thrombocytopenia,  symptomatic anemia,  hypertension,  chronic ITP  Hospital Course:   1.    Acute leukemia ([place]) cause of death, patient arrived to emergency department in extremis per family decided to continue with comfort care only do not attempt chemotherapy or any aggressive measures patient was admitted on comfort care and has passed away at 4:23 AM 2.     Neutropenic fever ([place]) patient was treated initially with IV antibiotics as per family request AKI she was gently rehydrated Thrombocytopenia ([place]) chronic but worsened by acute leukemia patient was ordered transfusion of platelets   Chronic ITP (idiopathic thrombocytopenia) (HCC) chronic Symptomatic anemia secondary to acute leukemia patient was ordered blood transfusion    Essential hypertension chronic blood pressure medications were held History of present illness:  Claudia Moore is a 85 y.o. 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  Patient passed away in a few hours after admission with her family at the bedside Cause of death acute leukemia Time: of death 4:21 AM  Signed:  Toy Baker  [facility] 09/28/2018, 12:42 AM

## 2018-10-01 NOTE — Progress Notes (Signed)
Informed PA Threasa Alpha of continued low bp. NS increased to 999. Rapid Response was called. Will continue to monitor.

## 2018-10-01 DEATH — deceased

## 2018-10-02 ENCOUNTER — Ambulatory Visit: Payer: Medicare Other | Admitting: Hematology and Oncology

## 2018-10-02 ENCOUNTER — Other Ambulatory Visit: Payer: Medicare Other

## 2019-02-24 IMAGING — CT CT ABD-PELV W/O
2 of 4 series · 16 of 46 positions shown, 18 images · non-contrast
Comparison: 09/14/2017

CLINICAL DATA: Watery diarrhea. Abdominal pain.

EXAM:
CT ABDOMEN AND PELVIS WITHOUT CONTRAST
TECHNIQUE: Multidetector CT imaging of the abdomen and pelvis was performed
following the standard protocol without IV contrast.

[Series 3: axial st · axial · 0.98mm/px · z∈[+871,+1236]mm · 13 of 83 slices shown, 15 images]
[im 5/83  soft-tissue]
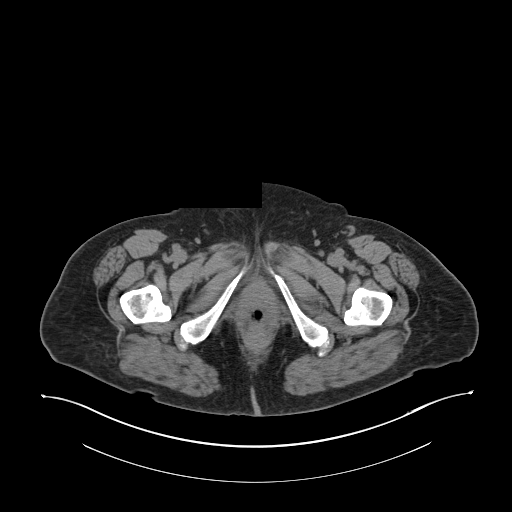
[im 5/83  bone]
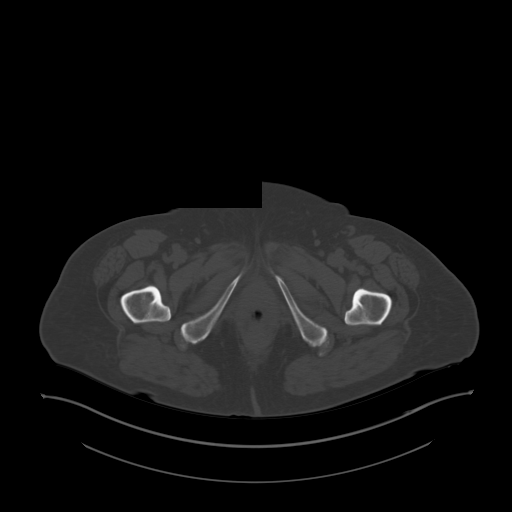
[im 13/83  soft-tissue]
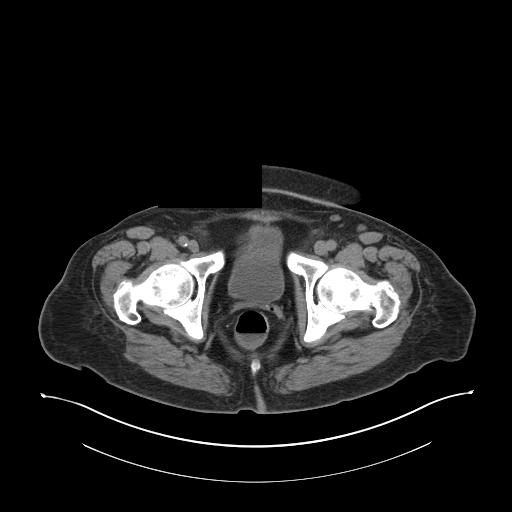
[im 18/83  soft-tissue]
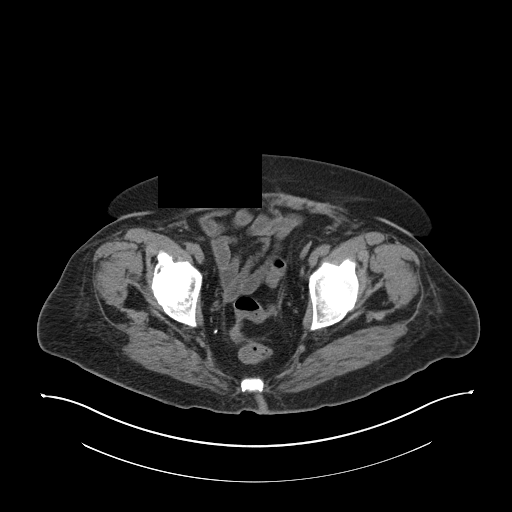
[im 22/83  soft-tissue]
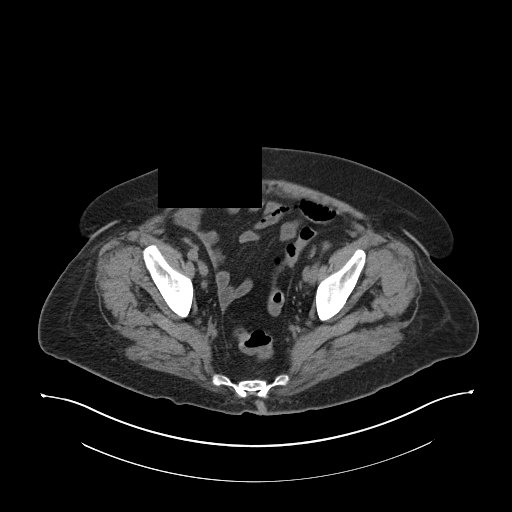
[im 31/83  soft-tissue]
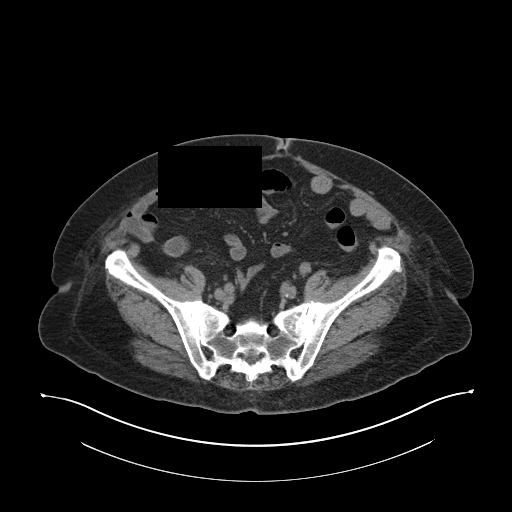
[im 35/83  soft-tissue]
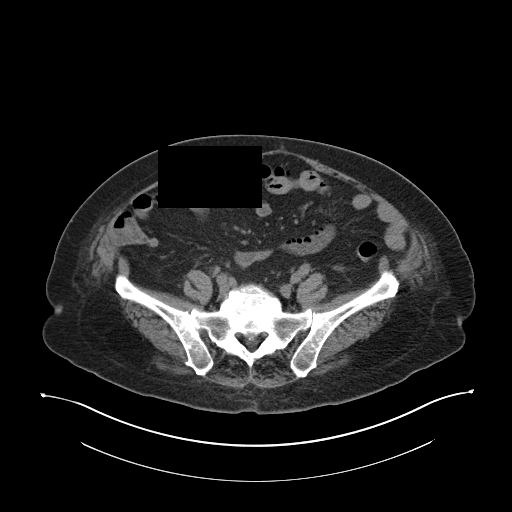
[im 44/83  soft-tissue]
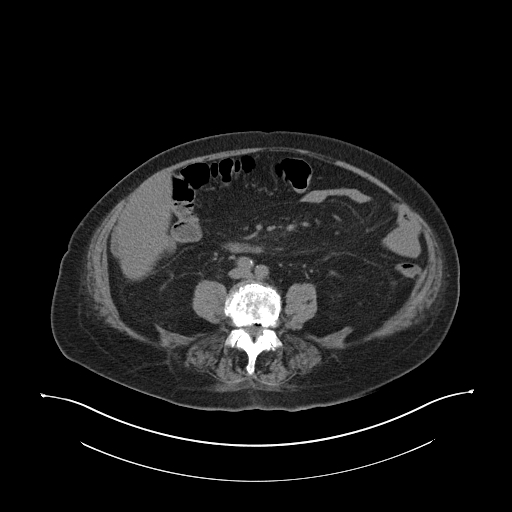
[im 48/83  soft-tissue]
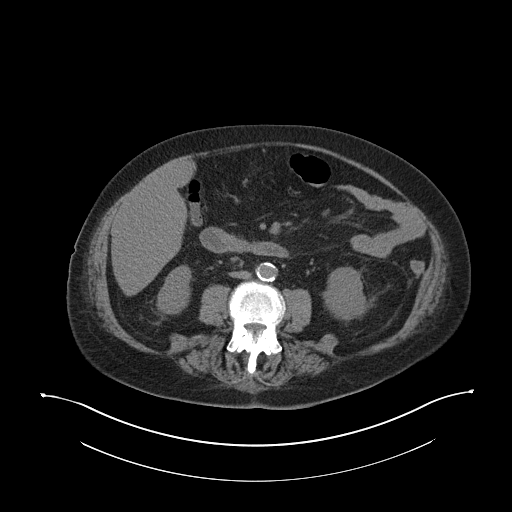
[im 52/83  soft-tissue]
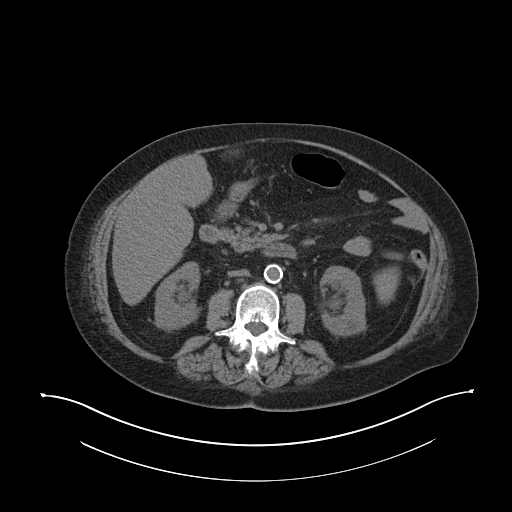
[im 52/83  bone]
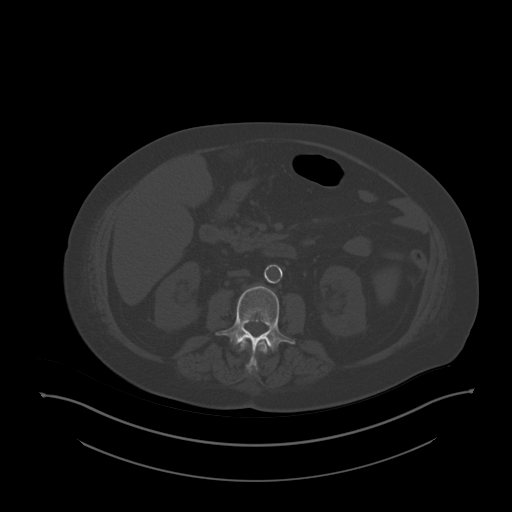
[im 61/83  soft-tissue]
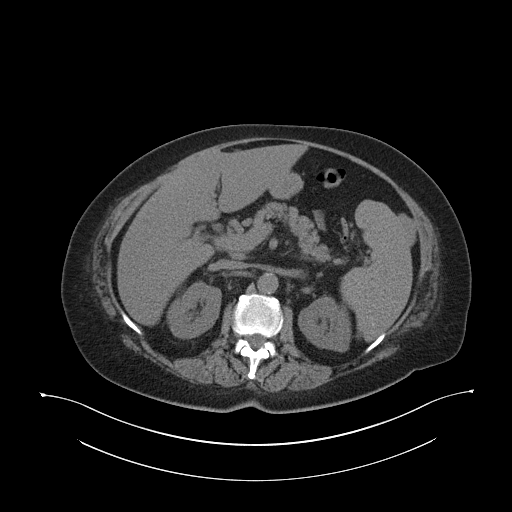
[im 65/83  soft-tissue]
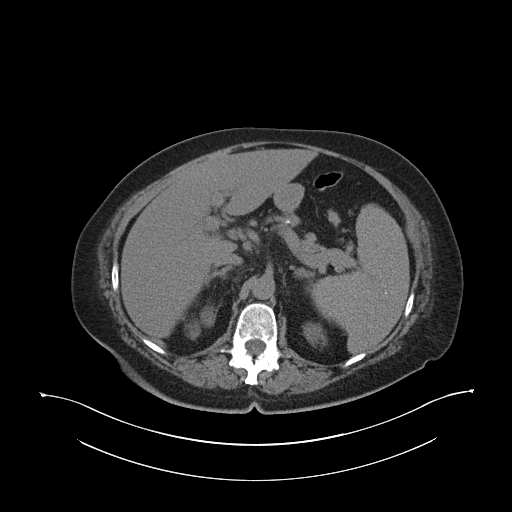
[im 70/83  soft-tissue]
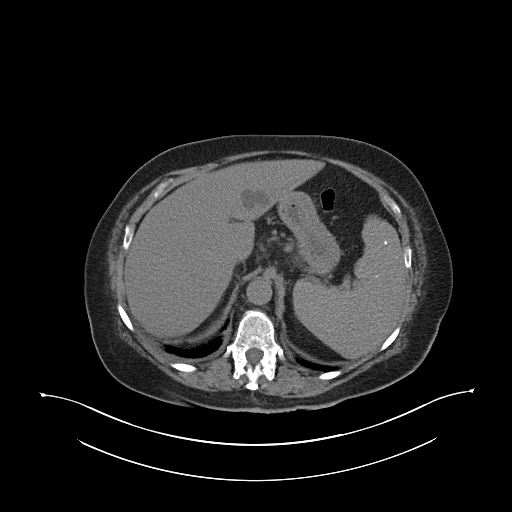
[im 78/83  soft-tissue]
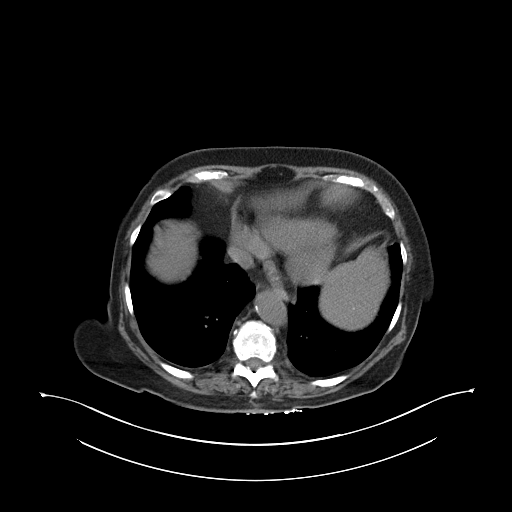

[Series 5: coronal st · coronal · 0.92mm/px · 3 of 109 slices shown]
[im 37/109  soft-tissue]
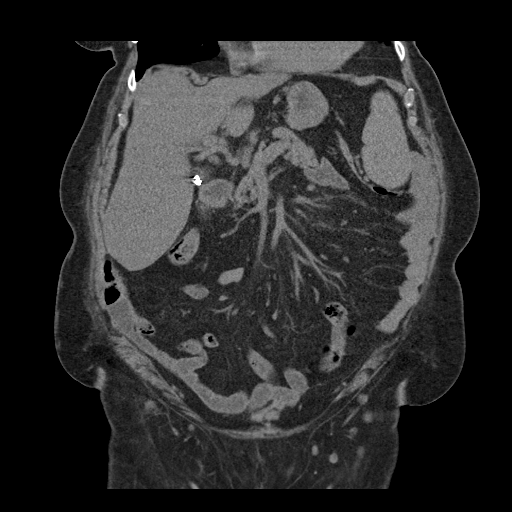
[im 49/109  soft-tissue]
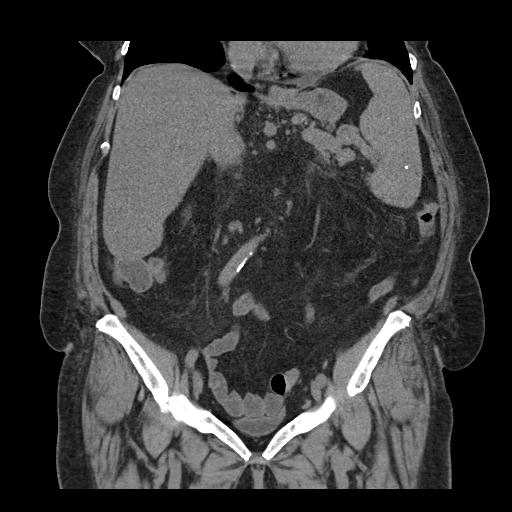
[im 61/109  soft-tissue]
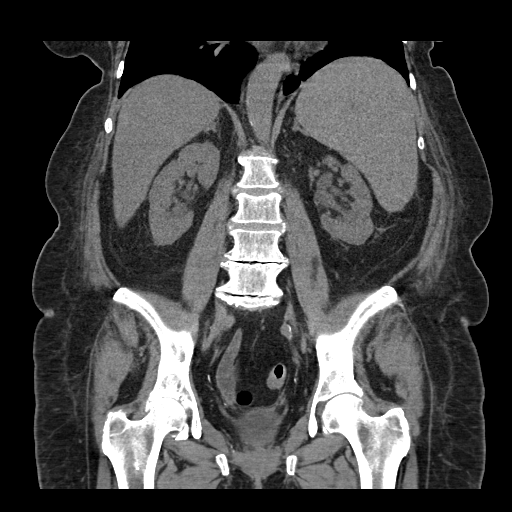

[16 of 46 positions shown; findings below may reference images not displayed]

FINDINGS: Lower chest: 5 mm posterior right middle lobe pulmonary nodule is
stable.

Hepatobiliary: The liver shows diffusely decreased attenuation
suggesting steatosis. 2.6 cm cyst in the lateral segment left liver
is stable. Gallbladder surgically absent. No intrahepatic or
extrahepatic biliary dilation.

Pancreas: No focal mass lesion. No dilatation of the main duct. No
intraparenchymal cyst. No peripancreatic edema.

Spleen: Calcified granulomata.

Adrenals/Urinary Tract: No adrenal nodule or mass. Right kidney
unremarkable. Central sinus cysts in the left kidney well
demonstrated on prior study with contrast material. Small
low-density lesions in the left kidney are compatible with cysts,
also better demonstrated previously. No evidence for hydroureter.
The urinary bladder appears normal for the degree of distention.

Stomach/Bowel: Stomach is nondistended. No gastric wall thickening.
No evidence of outlet obstruction. Duodenum is normally positioned
as is the ligament of Treitz. No small bowel wall thickening. No
small bowel dilatation. The terminal ileum is normal. The appendix
is not visualized, but there is no edema or inflammation in the
region of the cecum. No gross colonic mass. No colonic wall
thickening.

Vascular/Lymphatic: There is abdominal aortic atherosclerosis
without aneurysm. There is no gastrohepatic or hepatoduodenal
ligament lymphadenopathy. No intraperitoneal or retroperitoneal
lymphadenopathy. No pelvic sidewall lymphadenopathy.

Reproductive: Uterus surgically absent.  There is no adnexal mass.

Other: No intraperitoneal free fluid.

Musculoskeletal: No worrisome lytic or sclerotic osseous
abnormality.
IMPRESSION: 1. No acute findings in the abdomen or pelvis. Specifically, no
findings to explain the patient's history of diarrhea and pain.
2. Hepatic and left renal cysts.
3. Stable 5 mm right middle lobe pulmonary nodule since 09/14/2017.
No follow-up needed if patient is low-risk. Non-contrast chest CT
can be considered in 12 months if patient is high-risk. This
recommendation follows the consensus statement: Guidelines for
Management of Incidental Pulmonary Nodules Detected on CT Images:
4.  Aortic Atherosclerois (Q6P0A-170.0)

## 2019-08-22 IMAGING — XA PICC
1 series · 1 of 1 positions shown · non-contrast
Comparison: none

INDICATION: 84-year-old female referred for PICC placement

[Series 300: line placements · 1 of 1 slices shown]
[im 1/1]
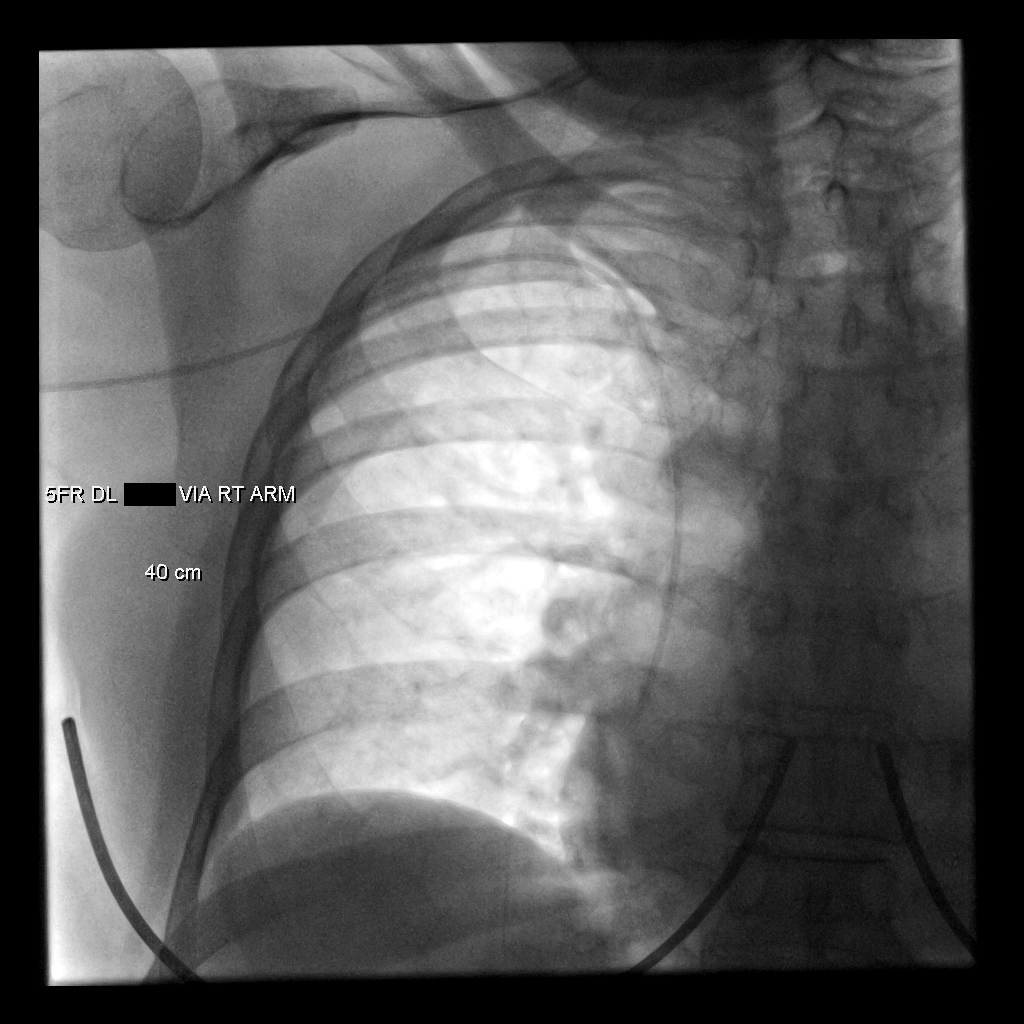

[1 of 1 positions shown; findings below may reference images not displayed]

EXAM:
PICC LINE PLACEMENT WITH ULTRASOUND AND FLUOROSCOPIC GUIDANCE

MEDICATIONS:
None

ANESTHESIA/SEDATION:
None.

FLUOROSCOPY TIME:  Fluoroscopy Time: 0 minutes 6 seconds (0.7 mGy).

COMPLICATIONS:
None

PROCEDURE:
Informed written consent was obtained from the patient after a
thorough discussion of the procedural risks, benefits and
alternatives. All questions were addressed. Maximal Sterile Barrier
Technique was utilized including caps, mask, sterile gowns, sterile
gloves, sterile drape, hand hygiene and skin antiseptic. A timeout
was performed prior to the initiation of the procedure.

Patient was position in the supine position on the fluoroscopy table
with the right arm abducted 90 degrees. Ultrasound survey of the
upper extremity was performed with images stored and sent to PACs.

The right brachial vein was selected for access.

Once the patient was prepped and draped in the usual sterile
fashion, the skin and subcutaneous tissues were generously
infiltrated with 1% lidocaine for local anesthesia.

A micropuncture access kit was then used to access the targeted
vein. Wire was passed centrally, confirmed to be within the venous
system under fluoroscopy. A small stab incision was made with an 11
blade scalpel and the sheath was then placed over the wire.
Estimated length of the catheter was then performed with the
indwelling wire.

Catheter was amputated at 40 cm length and placed with coaxial wire
through the peel-away.

Double lumen, power injectable PICC in the brachial vein. Tip
confirmed at the cavoatrial junction, and the catheter is ready for
use.

Stat lock was placed.

Patient tolerated the procedure well and remained hemodynamically
stable throughout.

No complications were encountered and no significant blood loss was
encountered.
IMPRESSION: Status post right upper extremity double lumen power PICC. Catheter
ready for use.

## 2019-09-22 IMAGING — DX PORTABLE CHEST - 1 VIEW
1 series · 1 of 1 positions shown · non-contrast
Comparison: 02/08/2018

CLINICAL DATA: Shortness of breath, fever

EXAM:
PORTABLE CHEST 1 VIEW

[chest ap]
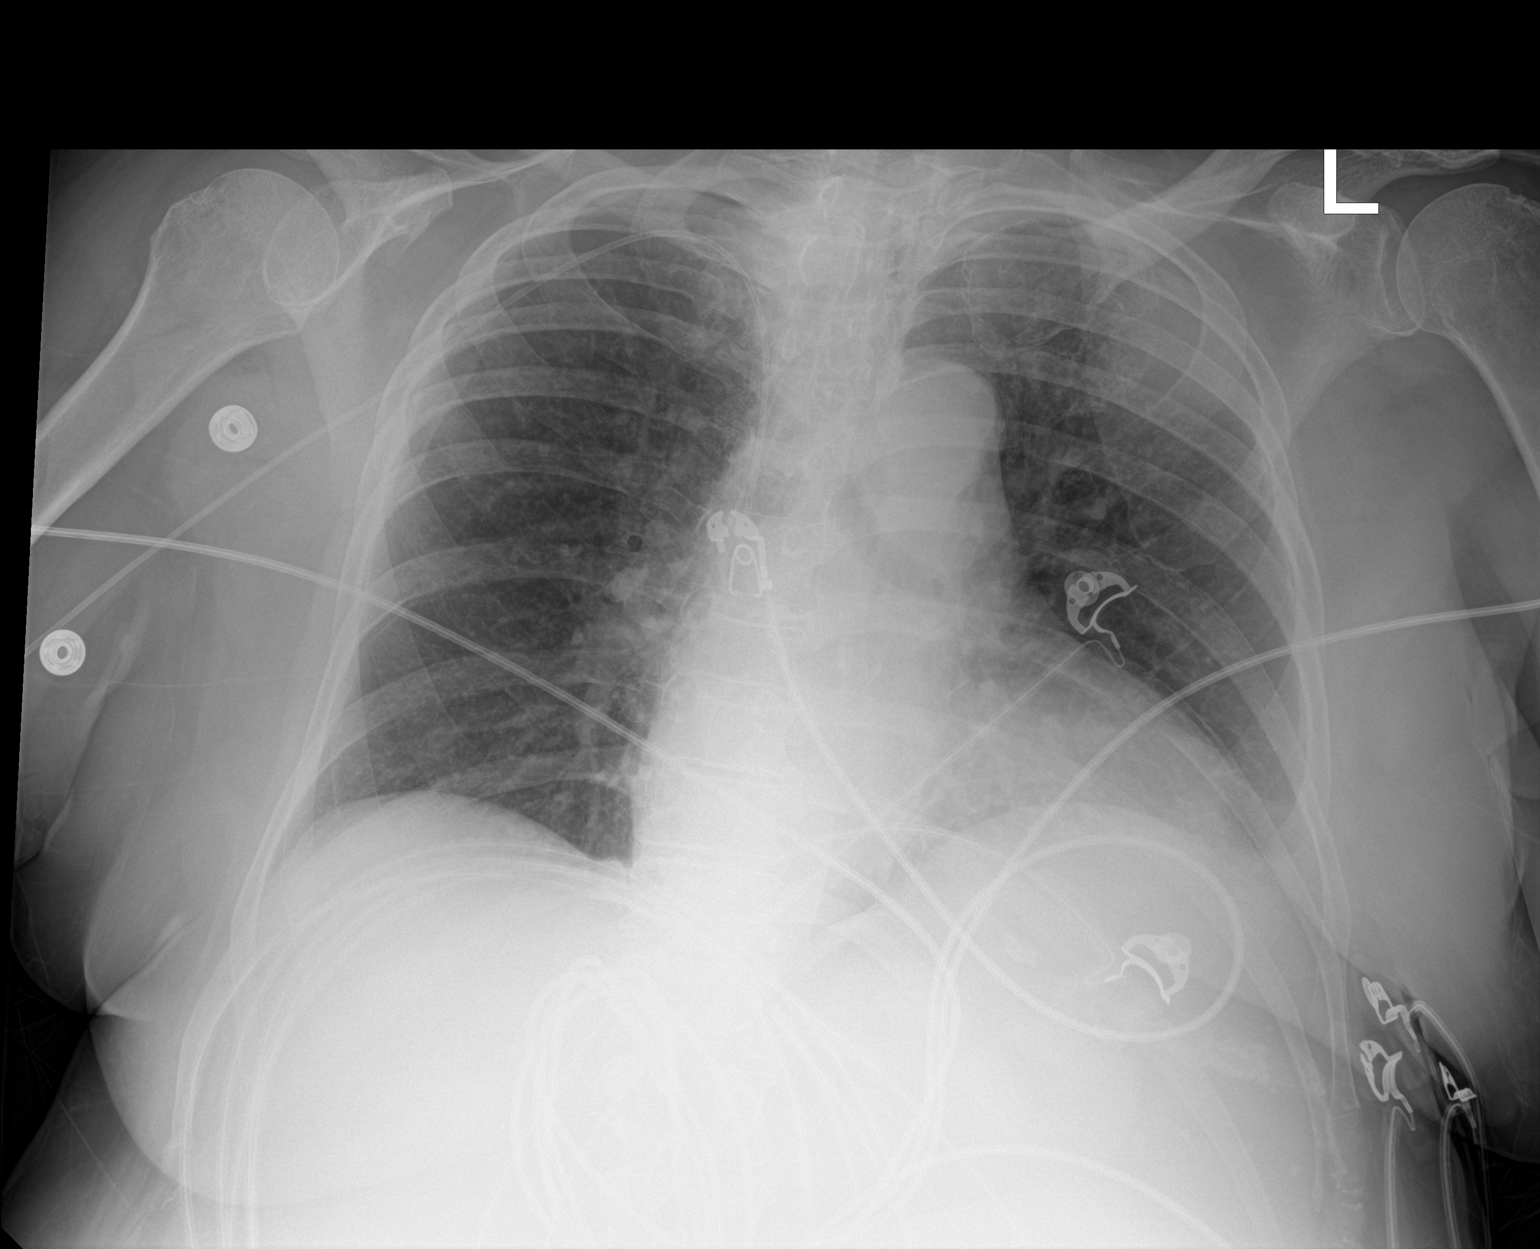

[1 of 1 positions shown; findings below may reference images not displayed]

FINDINGS: Mildly enlarged cardiac silhouette, unchanged. Right upper extremity
PICC line with distal tip terminating the level of the distal SVC.
Blunting of the left costophrenic angle may reflect a small left
pleural effusion. No focal airspace consolidation. No pneumothorax.
IMPRESSION: Possible small left pleural effusion.
